# Patient Record
Sex: Male | Born: 1975 | Race: White | Hispanic: No | Marital: Married | Smoking: Current some day smoker
Health system: Southern US, Community
[De-identification: ages and names within clinical notes are randomized; demographics above are authoritative.]

## PROBLEM LIST (undated history)

## (undated) DIAGNOSIS — S069XAA Unspecified intracranial injury with loss of consciousness status unknown, initial encounter: Secondary | ICD-10-CM

## (undated) DIAGNOSIS — S3609XA Other injury of spleen, initial encounter: Secondary | ICD-10-CM

## (undated) DIAGNOSIS — A4902 Methicillin resistant Staphylococcus aureus infection, unspecified site: Secondary | ICD-10-CM

## (undated) HISTORY — DX: Unspecified intracranial injury with loss of consciousness status unknown, initial encounter: S06.9XAA

## (undated) HISTORY — PX: OSTOMY: SHX5997

## (undated) HISTORY — PX: FACIAL RECONSTRUCTION SURGERY: SHX631

## (undated) HISTORY — DX: Methicillin resistant Staphylococcus aureus infection, unspecified site: A49.02

---

## 2020-11-30 ENCOUNTER — Emergency Department: Payer: BC Managed Care – PPO

## 2020-11-30 ENCOUNTER — Inpatient Hospital Stay
Admission: EM | Admit: 2020-11-30 | Discharge: 2020-12-17 | DRG: 853 | Disposition: A | Discharge status: Home Health | Payer: BC Managed Care – PPO | Attending: Internal Medicine | Admitting: Internal Medicine

## 2020-11-30 ENCOUNTER — Other Ambulatory Visit: Payer: Self-pay

## 2020-11-30 ENCOUNTER — Inpatient Hospital Stay: Payer: BC Managed Care – PPO

## 2020-11-30 DIAGNOSIS — A419 Sepsis, unspecified organism: Secondary | ICD-10-CM

## 2020-11-30 DIAGNOSIS — R7989 Other specified abnormal findings of blood chemistry: Secondary | ICD-10-CM | POA: Diagnosis not present

## 2020-11-30 DIAGNOSIS — J9601 Acute respiratory failure with hypoxia: Secondary | ICD-10-CM | POA: Diagnosis present

## 2020-11-30 DIAGNOSIS — N179 Acute kidney failure, unspecified: Secondary | ICD-10-CM | POA: Diagnosis present

## 2020-11-30 DIAGNOSIS — I248 Other forms of acute ischemic heart disease: Secondary | ICD-10-CM | POA: Diagnosis present

## 2020-11-30 DIAGNOSIS — E872 Acidosis, unspecified: Secondary | ICD-10-CM | POA: Diagnosis present

## 2020-11-30 DIAGNOSIS — R918 Other nonspecific abnormal finding of lung field: Secondary | ICD-10-CM | POA: Diagnosis not present

## 2020-11-30 DIAGNOSIS — J9602 Acute respiratory failure with hypercapnia: Secondary | ICD-10-CM | POA: Diagnosis present

## 2020-11-30 DIAGNOSIS — K640 First degree hemorrhoids: Secondary | ICD-10-CM | POA: Diagnosis present

## 2020-11-30 DIAGNOSIS — R778 Other specified abnormalities of plasma proteins: Secondary | ICD-10-CM

## 2020-11-30 DIAGNOSIS — R338 Other retention of urine: Secondary | ICD-10-CM | POA: Diagnosis not present

## 2020-11-30 DIAGNOSIS — E876 Hypokalemia: Secondary | ICD-10-CM | POA: Diagnosis not present

## 2020-11-30 DIAGNOSIS — E860 Dehydration: Secondary | ICD-10-CM | POA: Diagnosis present

## 2020-11-30 DIAGNOSIS — K254 Chronic or unspecified gastric ulcer with hemorrhage: Secondary | ICD-10-CM | POA: Diagnosis present

## 2020-11-30 DIAGNOSIS — N1330 Unspecified hydronephrosis: Secondary | ICD-10-CM | POA: Diagnosis present

## 2020-11-30 DIAGNOSIS — R7881 Bacteremia: Secondary | ICD-10-CM

## 2020-11-30 DIAGNOSIS — G062 Extradural and subdural abscess, unspecified: Secondary | ICD-10-CM

## 2020-11-30 DIAGNOSIS — B9562 Methicillin resistant Staphylococcus aureus infection as the cause of diseases classified elsewhere: Secondary | ICD-10-CM | POA: Diagnosis not present

## 2020-11-30 DIAGNOSIS — Z9181 History of falling: Secondary | ICD-10-CM

## 2020-11-30 DIAGNOSIS — K828 Other specified diseases of gallbladder: Secondary | ICD-10-CM | POA: Diagnosis present

## 2020-11-30 DIAGNOSIS — G8929 Other chronic pain: Secondary | ICD-10-CM | POA: Diagnosis present

## 2020-11-30 DIAGNOSIS — A86 Unspecified viral encephalitis: Secondary | ICD-10-CM | POA: Diagnosis present

## 2020-11-30 DIAGNOSIS — D500 Iron deficiency anemia secondary to blood loss (chronic): Secondary | ICD-10-CM | POA: Clinically undetermined

## 2020-11-30 DIAGNOSIS — Z539 Procedure and treatment not carried out, unspecified reason: Secondary | ICD-10-CM | POA: Diagnosis not present

## 2020-11-30 DIAGNOSIS — I10 Essential (primary) hypertension: Secondary | ICD-10-CM | POA: Diagnosis present

## 2020-11-30 DIAGNOSIS — D62 Acute posthemorrhagic anemia: Secondary | ICD-10-CM | POA: Diagnosis present

## 2020-11-30 DIAGNOSIS — I160 Hypertensive urgency: Secondary | ICD-10-CM | POA: Diagnosis present

## 2020-11-30 DIAGNOSIS — N200 Calculus of kidney: Secondary | ICD-10-CM | POA: Diagnosis present

## 2020-11-30 DIAGNOSIS — A4102 Sepsis due to Methicillin resistant Staphylococcus aureus: Secondary | ICD-10-CM | POA: Diagnosis present

## 2020-11-30 DIAGNOSIS — R7401 Elevation of levels of liver transaminase levels: Secondary | ICD-10-CM | POA: Diagnosis present

## 2020-11-30 DIAGNOSIS — D65 Disseminated intravascular coagulation [defibrination syndrome]: Secondary | ICD-10-CM | POA: Diagnosis present

## 2020-11-30 DIAGNOSIS — Z4659 Encounter for fitting and adjustment of other gastrointestinal appliance and device: Secondary | ICD-10-CM

## 2020-11-30 DIAGNOSIS — M4626 Osteomyelitis of vertebra, lumbar region: Secondary | ICD-10-CM | POA: Diagnosis present

## 2020-11-30 DIAGNOSIS — Z634 Disappearance and death of family member: Secondary | ICD-10-CM

## 2020-11-30 DIAGNOSIS — R4182 Altered mental status, unspecified: Secondary | ICD-10-CM

## 2020-11-30 DIAGNOSIS — L02511 Cutaneous abscess of right hand: Secondary | ICD-10-CM | POA: Diagnosis present

## 2020-11-30 DIAGNOSIS — Z7982 Long term (current) use of aspirin: Secondary | ICD-10-CM

## 2020-11-30 DIAGNOSIS — Z419 Encounter for procedure for purposes other than remedying health state, unspecified: Secondary | ICD-10-CM

## 2020-11-30 DIAGNOSIS — M6008 Infective myositis, other site: Secondary | ICD-10-CM | POA: Diagnosis present

## 2020-11-30 DIAGNOSIS — B9689 Other specified bacterial agents as the cause of diseases classified elsewhere: Secondary | ICD-10-CM | POA: Diagnosis not present

## 2020-11-30 DIAGNOSIS — I2690 Septic pulmonary embolism without acute cor pulmonale: Secondary | ICD-10-CM | POA: Diagnosis present

## 2020-11-30 DIAGNOSIS — M48061 Spinal stenosis, lumbar region without neurogenic claudication: Secondary | ICD-10-CM | POA: Diagnosis present

## 2020-11-30 DIAGNOSIS — D696 Thrombocytopenia, unspecified: Secondary | ICD-10-CM | POA: Diagnosis not present

## 2020-11-30 DIAGNOSIS — R6520 Severe sepsis without septic shock: Secondary | ICD-10-CM | POA: Diagnosis present

## 2020-11-30 DIAGNOSIS — G061 Intraspinal abscess and granuloma: Secondary | ICD-10-CM | POA: Diagnosis present

## 2020-11-30 DIAGNOSIS — K2980 Duodenitis without bleeding: Secondary | ICD-10-CM | POA: Diagnosis present

## 2020-11-30 DIAGNOSIS — K2970 Gastritis, unspecified, without bleeding: Secondary | ICD-10-CM | POA: Diagnosis present

## 2020-11-30 DIAGNOSIS — G9340 Encephalopathy, unspecified: Secondary | ICD-10-CM | POA: Diagnosis not present

## 2020-11-30 DIAGNOSIS — D123 Benign neoplasm of transverse colon: Secondary | ICD-10-CM | POA: Diagnosis present

## 2020-11-30 DIAGNOSIS — D120 Benign neoplasm of cecum: Secondary | ICD-10-CM | POA: Diagnosis present

## 2020-11-30 DIAGNOSIS — W19XXXA Unspecified fall, initial encounter: Secondary | ICD-10-CM | POA: Diagnosis present

## 2020-11-30 DIAGNOSIS — Z20822 Contact with and (suspected) exposure to covid-19: Secondary | ICD-10-CM | POA: Diagnosis present

## 2020-11-30 DIAGNOSIS — Z9289 Personal history of other medical treatment: Secondary | ICD-10-CM

## 2020-11-30 DIAGNOSIS — M4647 Discitis, unspecified, lumbosacral region: Secondary | ICD-10-CM | POA: Diagnosis present

## 2020-11-30 DIAGNOSIS — M2578 Osteophyte, vertebrae: Secondary | ICD-10-CM | POA: Diagnosis present

## 2020-11-30 DIAGNOSIS — K5900 Constipation, unspecified: Secondary | ICD-10-CM | POA: Diagnosis present

## 2020-11-30 DIAGNOSIS — F17210 Nicotine dependence, cigarettes, uncomplicated: Secondary | ICD-10-CM | POA: Diagnosis present

## 2020-11-30 DIAGNOSIS — K7200 Acute and subacute hepatic failure without coma: Secondary | ICD-10-CM | POA: Diagnosis not present

## 2020-11-30 DIAGNOSIS — G9341 Metabolic encephalopathy: Secondary | ICD-10-CM | POA: Diagnosis present

## 2020-11-30 LAB — CBC WITH DIFFERENTIAL/PLATELET
Abs Immature Granulocytes: 0.13 10*3/uL — ABNORMAL HIGH (ref 0.00–0.07)
Basophils Absolute: 0.1 10*3/uL (ref 0.0–0.1)
Basophils Relative: 1 %
Eosinophils Absolute: 0 10*3/uL (ref 0.0–0.5)
Eosinophils Relative: 0 %
HCT: 36.6 % — ABNORMAL LOW (ref 39.0–52.0)
Hemoglobin: 12.8 g/dL — ABNORMAL LOW (ref 13.0–17.0)
Immature Granulocytes: 1 %
Lymphocytes Relative: 4 %
Lymphs Abs: 0.6 10*3/uL — ABNORMAL LOW (ref 0.7–4.0)
MCH: 29.8 pg (ref 26.0–34.0)
MCHC: 35 g/dL (ref 30.0–36.0)
MCV: 85.1 fL (ref 80.0–100.0)
Monocytes Absolute: 0.6 10*3/uL (ref 0.1–1.0)
Monocytes Relative: 4 %
Neutro Abs: 12.8 10*3/uL — ABNORMAL HIGH (ref 1.7–7.7)
Neutrophils Relative %: 90 %
Platelets: 125 10*3/uL — ABNORMAL LOW (ref 150–400)
RBC: 4.3 MIL/uL (ref 4.22–5.81)
RDW: 14.9 % (ref 11.5–15.5)
Smear Review: ADEQUATE
WBC Morphology: ABNORMAL
WBC: 14.2 10*3/uL — ABNORMAL HIGH (ref 4.0–10.5)
nRBC: 0 % (ref 0.0–0.2)

## 2020-11-30 LAB — ACETAMINOPHEN LEVEL: Acetaminophen (Tylenol), Serum: <10 ug/mL — ABNORMAL LOW (ref 10–30)

## 2020-11-30 LAB — ETHANOL: Alcohol, Ethyl (B): <10 mg/dL (ref <10)

## 2020-11-30 LAB — COMPREHENSIVE METABOLIC PANEL
ALT: 55 U/L — ABNORMAL HIGH (ref 0–44)
AST: 102 U/L — ABNORMAL HIGH (ref 15–41)
Albumin: 2.9 g/dL — ABNORMAL LOW (ref 3.5–5.0)
Alkaline Phosphatase: 166 U/L — ABNORMAL HIGH (ref 38–126)
Anion gap: 13 (ref 5–15)
BUN: 54 mg/dL — ABNORMAL HIGH (ref 6–20)
CO2: 23 mmol/L (ref 22–32)
Calcium: 8.4 mg/dL — ABNORMAL LOW (ref 8.9–10.3)
Chloride: 99 mmol/L (ref 98–111)
Creatinine, Ser: 1.58 mg/dL — ABNORMAL HIGH (ref 0.61–1.24)
GFR, Estimated: 54 mL/min — ABNORMAL LOW (ref >60)
Glucose, Bld: 117 mg/dL — ABNORMAL HIGH (ref 70–99)
Potassium: 3 mmol/L — ABNORMAL LOW (ref 3.5–5.1)
Sodium: 135 mmol/L (ref 135–145)
Total Bilirubin: 1.1 mg/dL (ref 0.3–1.2)
Total Protein: 6.5 g/dL (ref 6.5–8.1)

## 2020-11-30 LAB — URINALYSIS, COMPLETE (UACMP) WITH MICROSCOPIC
Bacteria, UA: NONE SEEN
Bilirubin Urine: NEGATIVE
Glucose, UA: NEGATIVE mg/dL
Ketones, ur: NEGATIVE mg/dL
Leukocytes,Ua: NEGATIVE
Nitrite: NEGATIVE
Protein, ur: 100 mg/dL — AB
Specific Gravity, Urine: 1.024 (ref 1.005–1.030)
pH: 5 (ref 5.0–8.0)

## 2020-11-30 LAB — LACTIC ACID, PLASMA
Lactic Acid, Venous: 2 mmol/L (ref 0.5–1.9)
Lactic Acid, Venous: 2.2 mmol/L (ref 0.5–1.9)
Lactic Acid, Venous: 1.7 mmol/L (ref 0.5–1.9)

## 2020-11-30 LAB — URINE DRUG SCREEN, QUALITATIVE (ARMC ONLY)
Amphetamines, Ur Screen: NOT DETECTED
Barbiturates, Ur Screen: NOT DETECTED
Benzodiazepine, Ur Scrn: NOT DETECTED
Cannabinoid 50 Ng, Ur ~~LOC~~: NOT DETECTED
Cocaine Metabolite,Ur ~~LOC~~: NOT DETECTED
MDMA (Ecstasy)Ur Screen: NOT DETECTED
Methadone Scn, Ur: NOT DETECTED
Opiate, Ur Screen: POSITIVE — AB
Phencyclidine (PCP) Ur S: NOT DETECTED
Tricyclic, Ur Screen: NOT DETECTED

## 2020-11-30 LAB — AMMONIA: Ammonia: 14 umol/L (ref 9–35)

## 2020-11-30 LAB — TSH: TSH: 0.267 u[IU]/mL — ABNORMAL LOW (ref 0.350–4.500)

## 2020-11-30 LAB — RESP PANEL BY RT-PCR (FLU A&B, COVID) ARPGX2
Influenza A by PCR: NEGATIVE
Influenza B by PCR: NEGATIVE
SARS Coronavirus 2 by RT PCR: NEGATIVE

## 2020-11-30 LAB — TROPONIN I (HIGH SENSITIVITY)
Troponin I (High Sensitivity): 54 ng/L — ABNORMAL HIGH (ref <18)
Troponin I (High Sensitivity): 75 ng/L — ABNORMAL HIGH (ref <18)
Troponin I (High Sensitivity): 193 ng/L (ref <18)

## 2020-11-30 LAB — APTT: aPTT: 39 seconds — ABNORMAL HIGH (ref 24–36)

## 2020-11-30 LAB — SALICYLATE LEVEL: Salicylate Lvl: 13.4 mg/dL (ref 7.0–30.0)

## 2020-11-30 LAB — PROCALCITONIN: Procalcitonin: 17.28 ng/mL

## 2020-11-30 LAB — PROTIME-INR
INR: 1.2 (ref 0.8–1.2)
Prothrombin Time: 15.6 seconds — ABNORMAL HIGH (ref 11.4–15.2)

## 2020-11-30 LAB — MAGNESIUM: Magnesium: 2.4 mg/dL (ref 1.7–2.4)

## 2020-11-30 MED ORDER — IOHEXOL 300 MG/ML  SOLN
100.0000 mL | Freq: Once | INTRAMUSCULAR | Status: AC | PRN
  Administered 2020-11-30: 100 mL via INTRAVENOUS

## 2020-11-30 MED ORDER — ACETAMINOPHEN 325 MG PO TABS: 650.0000 mg | ORAL_TABLET | Freq: Four times a day (QID) | ORAL | Status: DC | PRN

## 2020-11-30 MED ORDER — LACTATED RINGERS IV BOLUS
1000.0000 mL | Freq: Once | INTRAVENOUS | Status: AC
  Administered 2020-11-30: 1000 mL via INTRAVENOUS

## 2020-11-30 MED ORDER — LORAZEPAM 2 MG/ML IJ SOLN
1.0000 mg | Freq: Four times a day (QID) | INTRAMUSCULAR | Status: DC | PRN
  Administered 2020-11-30 – 2020-12-05 (×2): 1 mg via INTRAMUSCULAR
  Filled 2020-11-30 (×3): qty 1

## 2020-11-30 MED ORDER — METRONIDAZOLE 500 MG/100ML IV SOLN
500.0000 mg | Freq: Once | INTRAVENOUS | Status: AC
  Administered 2020-11-30: 500 mg via INTRAVENOUS
  Filled 2020-11-30: qty 100

## 2020-11-30 MED ORDER — ONDANSETRON HCL 4 MG PO TABS: 4.0000 mg | ORAL_TABLET | Freq: Four times a day (QID) | ORAL | Status: DC | PRN

## 2020-11-30 MED ORDER — VANCOMYCIN HCL IN DEXTROSE 1-5 GM/200ML-% IV SOLN: 1000.0000 mg | Freq: Once | INTRAVENOUS | Status: DC

## 2020-11-30 MED ORDER — ACETAMINOPHEN 650 MG RE SUPP
650.0000 mg | Freq: Four times a day (QID) | RECTAL | Status: DC | PRN
  Administered 2020-11-30: 650 mg via RECTAL
  Filled 2020-11-30: qty 2

## 2020-11-30 MED ORDER — VANCOMYCIN HCL 1000 MG/200ML IV SOLN
1000.0000 mg | Freq: Two times a day (BID) | INTRAVENOUS | Status: DC
  Administered 2020-12-01 – 2020-12-02 (×4): 1000 mg via INTRAVENOUS
  Filled 2020-11-30 (×5): qty 200

## 2020-11-30 MED ORDER — METOPROLOL TARTRATE 5 MG/5ML IV SOLN
5.0000 mg | Freq: Once | INTRAVENOUS | Status: AC
  Administered 2020-11-30: 5 mg via INTRAVENOUS
  Filled 2020-11-30: qty 5

## 2020-11-30 MED ORDER — ENOXAPARIN SODIUM 40 MG/0.4ML IJ SOSY
40.0000 mg | PREFILLED_SYRINGE | INTRAMUSCULAR | Status: DC
  Administered 2020-11-30: 40 mg via SUBCUTANEOUS

## 2020-11-30 MED ORDER — VANCOMYCIN HCL 1750 MG/350ML IV SOLN
1750.0000 mg | Freq: Once | INTRAVENOUS | Status: AC
  Administered 2020-11-30: 1750 mg via INTRAVENOUS
  Filled 2020-11-30: qty 350

## 2020-11-30 MED ORDER — CEFEPIME HCL 2 G IJ SOLR
2.0000 g | Freq: Three times a day (TID) | INTRAMUSCULAR | Status: DC
  Administered 2020-11-30: 2 g via INTRAVENOUS
  Filled 2020-11-30: qty 2

## 2020-11-30 MED ORDER — LACTATED RINGERS IV SOLN: INTRAVENOUS | Status: DC

## 2020-11-30 MED ORDER — SODIUM CHLORIDE 0.9 % IV SOLN
2.0000 g | Freq: Once | INTRAVENOUS | Status: AC
  Administered 2020-11-30: 2 g via INTRAVENOUS
  Filled 2020-11-30: qty 2

## 2020-11-30 MED ORDER — GADOBUTROL 1 MMOL/ML IV SOLN
7.0000 mL | Freq: Once | INTRAVENOUS | Status: AC | PRN
  Administered 2020-11-30: 7 mL via INTRAVENOUS

## 2020-11-30 MED ORDER — DEXTROSE 5 % IV SOLN
10.0000 mg/kg | Freq: Three times a day (TID) | INTRAVENOUS | Status: DC
  Administered 2020-11-30: 725 mg via INTRAVENOUS
  Filled 2020-11-30 (×3): qty 14.5

## 2020-11-30 MED ORDER — MIDAZOLAM HCL 2 MG/2ML IJ SOLN
INTRAMUSCULAR | Status: AC
  Administered 2020-11-30: 4 mg via INTRAVENOUS
  Filled 2020-11-30: qty 4

## 2020-11-30 MED ORDER — POTASSIUM CHLORIDE 10 MEQ/100ML IV SOLN
10.0000 meq | INTRAVENOUS | Status: AC
  Administered 2020-11-30 (×5): 10 meq via INTRAVENOUS
  Filled 2020-11-30 (×5): qty 100

## 2020-11-30 MED ORDER — FENTANYL CITRATE PF 50 MCG/ML IJ SOSY
50.0000 ug | PREFILLED_SYRINGE | Freq: Once | INTRAMUSCULAR | Status: AC
  Administered 2020-11-30: 50 ug via INTRAVENOUS
  Filled 2020-11-30: qty 1

## 2020-11-30 MED ORDER — LORAZEPAM 1 MG PO TABS
1.0000 mg | ORAL_TABLET | Freq: Four times a day (QID) | ORAL | Status: DC | PRN
  Administered 2020-12-03 – 2020-12-17 (×12): 1 mg via ORAL
  Filled 2020-11-30 (×12): qty 1

## 2020-11-30 MED ORDER — CHLORHEXIDINE GLUCONATE CLOTH 2 % EX PADS
6.0000 | MEDICATED_PAD | Freq: Every day | CUTANEOUS | Status: DC
  Administered 2020-12-01 – 2020-12-17 (×13): 6 via TOPICAL
  Filled 2020-11-30: qty 6

## 2020-11-30 MED ORDER — SODIUM CHLORIDE 0.9 % IV SOLN: 2.0000 g | INTRAVENOUS | Status: DC

## 2020-11-30 MED ORDER — OXYCODONE HCL 5 MG PO TABS: 5.0000 mg | ORAL_TABLET | ORAL | Status: DC | PRN

## 2020-11-30 MED ORDER — ONDANSETRON HCL 4 MG/2ML IJ SOLN: 4.0000 mg | Freq: Four times a day (QID) | INTRAMUSCULAR | Status: DC | PRN

## 2020-11-30 MED ORDER — MIDAZOLAM HCL 2 MG/2ML IJ SOLN: 4.0000 mg | Freq: Once | INTRAMUSCULAR | Status: AC

## 2020-11-30 MED ORDER — METRONIDAZOLE 500 MG/100ML IV SOLN
500.0000 mg | Freq: Two times a day (BID) | INTRAVENOUS | Status: DC
  Administered 2020-12-01: 500 mg via INTRAVENOUS
  Filled 2020-11-30: qty 100

## 2020-11-30 NOTE — Sepsis Progress Note (Signed)
Notified provider of need to order another repeat lactic acid.

## 2020-11-30 NOTE — Progress Notes (Addendum)
Pharmacy Antibiotic Note  Caleb Fisher is a 45 y.o. male admitted on 11/30/2020. Pharmacy has been consulted for cefepime dosing. 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  Plan: Cefepime 2 g IV q8h (based on calculated CrCl 59.9)  Start acyclovir 725 mg (10 mg/kg) q8h. Patient is on LR  Weight: 72.6 kg (160 lb)  Temp (24hrs), Avg:99.4 F (37.4 C), Min:99.4 F (37.4 C), Max:99.4 F (37.4 C)  Recent Labs  Lab 11/30/20 1310 11/30/20 1643  WBC 14.2*  --   CREATININE 1.58*  --   LATICACIDVEN 2.0* 2.2*    CrCl cannot be calculated (Unknown ideal weight.).    Not on File  Antimicrobials this admission: Acyclovir 11/15 >> Cefepime 11/15 >> Metronidazole 11/15 x 1 Vancomycin 11/15 x 1  Microbiology results: 11/15 BCx: pending   Thank you for allowing pharmacy to be a part of this patient's care.   Tawnya Crook, PharmD, BCPS Clinical Pharmacist 11/30/2020 6:22 PM

## 2020-11-30 NOTE — Sepsis Progress Note (Signed)
eLink is monitoring this Code Sepsis. °

## 2020-11-30 NOTE — ED Notes (Signed)
Family at bedside. 

## 2020-11-30 NOTE — Progress Notes (Signed)
PHARMACY -  BRIEF ANTIBIOTIC NOTE   Pharmacy has received consult(s) for vancomycin and cefepime from an ED provider.  The patient's profile has been reviewed for ht/wt/allergies/indication/available labs.    One time order(s) placed for: Cefepime 2 g IV Vancomycin 1750 mg IV  Further antibiotics/pharmacy consults should be ordered by admitting physician if indicated.                       Thank you, Forde Dandy Jennine Peddy 11/30/2020  2:43 PM

## 2020-11-30 NOTE — H&P (Signed)
History and Physical    [name] GGE:[id number] DOB: 01/23/1975 DOA: 11/30/2020 PCP: Merryl Hacker, No  Chief Complaint: AMS Historian: patient HPI:  Caleb Fisher is a 45 y.o. male with a PMH significant for tobacco use. They presented from home to the ED on 11/30/2020 with AMS and increased back pain for several days. Wife states that his last known normal conversations was 11/11. Since that time, he has been saying nonsensical things and falling frequently. He has been complaining of worsening of his chronic back pain and new onset neck pain. In fact, he has been rotating his whole body while keeping his neck straight rather than turn at the neck.  She denies any new activities or foods or anything out of his ordinary routine proceeding his symptoms. He "smokes like train", rarely drinks alcohol, but occasionally takes narcotics without prescription. She does not think he has done any IVD for the past 25 years while they've been married. Patient was unable to give any history. He is able to follow some simple commands and endorse that his name is [name]. In the ED, it was found that they had sever sepsis from unknown source.  No specific etiology was found in labs, UDS, head/abdominal/pelvis CT. 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 Patient vitals showed severe hypertension, tachypnea 27 and tachycardia 144. Temperature was 99.4.   They were treated with IV fluids and antibiotics.  Patient was admitted to medicine service for further workup and management of severe sepsis as outlined in detail below.  Review of Systems: unable to assess due to patient illnes  Prior to Admission medications   Not on File   I have personally, briefly reviewed patient's prior medical records in [place]  Physical Exam: Vitals:   11/30/20 1311 11/30/20 1700 11/30/20 1745  BP: (!) 161/113 (!) 198/115 (!) 166/101  Pulse: (!) 121 (!)  138 (!) 143  Resp: 18 (!) 27 (!) 22  Temp: 99.4 F (37.4 C)    TempSrc: Oral    SpO2: 96% 92% 94%  Weight: 72.6 kg      Constitutional: alert HEENT: lids and conjunctivae normal Mucous membranes are moist. Posterior pharynx clear of any exudate or lesions.Normal dentition.  Neck: normal, supple, no masses, no thyromegaly Respiratory: CTAB, no wheezing, no crackles. Normal respiratory effort. No accessory muscle use.  Cardiovascular: tachycardia, no murmurs / rubs / gallops. No extremity edema. 2+ pedal pulses. no clubbing / cyanosis.  Abdomen: soft, NT, ND, no masses or HSM palpated. Musculoskeletal: No joint deformity upper and lower extremities. Normal muscle tone.  Skin: diaphoretic, intact, normal color, normal temperature. Several scabs of various stages of healing on forearms Neurologic: Alert and oriented to self. Able to follow some simple commands. Moving all extremities constantly. Positive brudzinski. Pain with neck flexion. PERRL. EOM intact Psychiatric: agitated  Labs on Admission: I have personally reviewed following labs and imaging studies  CBC: Recent Labs  Lab 11/30/20 1310  WBC 14.2*  NEUTROABS 12.8*  HGB 12.8*  HCT 36.6*  MCV 85.1  PLT 465*   Basic Metabolic Panel: Recent Labs  Lab 11/30/20 1310  NA 135  K 3.0*  CL 99  CO2 23  GLUCOSE 117*  BUN 54*  CREATININE 1.58*  CALCIUM 8.4*  MG 2.4   GFR: CrCl cannot be calculated (Unknown ideal weight.). Liver Function Tests: Recent Labs  Lab 11/30/20 1310  AST 102*  ALT 55*  ALKPHOS 166*  BILITOT  1.1  PROT 6.5  ALBUMIN 2.9*   No results for input(s): LIPASE, AMYLASE in the last 168 hours. Recent Labs  Lab 11/30/20 1454  AMMONIA 14   Coagulation Profile: Recent Labs  Lab 11/30/20 1310  INR 1.2   Cardiac Enzymes: No results for input(s): CKTOTAL, CKMB, CKMBINDEX, TROPONINI in the last 168 hours. BNP (last 3 results) No results for input(s): PROBNP in the last 8760  hours. HbA1C: No results for input(s): HGBA1C in the last 72 hours. CBG: No results for input(s): GLUCAP in the last 168 hours. Lipid Profile: No results for input(s): CHOL, HDL, LDLCALC, TRIG, CHOLHDL, LDLDIRECT in the last 72 hours. Thyroid Function Tests: Recent Labs    11/30/20 1310  TSH 0.267*   Anemia Panel: No results for input(s): VITAMINB12, FOLATE, FERRITIN, TIBC, IRON, RETICCTPCT in the last 72 hours. Urine analysis:    Component Value Date/Time   COLORURINE AMBER (A) 11/30/2020 1454   APPEARANCEUR HAZY (A) 11/30/2020 1454   LABSPEC 1.024 11/30/2020 1454   PHURINE 5.0 11/30/2020 1454   GLUCOSEU NEGATIVE 11/30/2020 1454   HGBUR MODERATE (A) 11/30/2020 1454   BILIRUBINUR NEGATIVE 11/30/2020 1454   KETONESUR NEGATIVE 11/30/2020 1454   PROTEINUR 100 (A) 11/30/2020 1454   NITRITE NEGATIVE 11/30/2020 1454   LEUKOCYTESUR NEGATIVE 11/30/2020 1454    Radiological Exams on Admission: DG Lumbar Spine 2-3 Views  Result Date: 11/30/2020 CLINICAL DATA:  Back pain EXAM: LUMBAR SPINE - 2-3 VIEW COMPARISON:  None. FINDINGS: Normal alignment. The vertebral body heights are well preserved. Multilevel ventral endplate spurring and disc space narrowing is identified. This is most severe at the L5-S1 level. Mild facet degenerative change identified at L3-4, L4-5 and L5-S1. IMPRESSION: 1. No acute findings. 2. Degenerative disc disease and facet arthropathy. Electronically Signed   By: Kerby Moors M.D.   On: 11/30/2020 14:24   CT Head Wo Contrast  Result Date: 11/30/2020 CLINICAL DATA:  Altered mental status EXAM: CT HEAD WITHOUT CONTRAST TECHNIQUE: Contiguous axial images were obtained from the base of the skull through the vertex without intravenous contrast. COMPARISON:  None. FINDINGS: Brain: Ventricles are not dilated. There is no shift of midline structures. Cortical sulci are prominent. There is 2 x 1 cm low-density in the left parieto-occipital cortex immediately above the  tentorium, possibly suggesting encephalomalacia from previous infarction. There is no focal edema or mass effect. There are no signs of bleeding within the cranium. Vascular: There are scattered arterial calcifications. Skull: Unremarkable. Sinuses/Orbits: Unremarkable. Other: None IMPRESSION: There are no signs of bleeding within the cranium. Ventricles are not dilated. There is no focal mass effect. There is 2 x 1 cm area of low-density in the left parieto-occipital cortex, possibly suggesting encephalomalacia from previous infarction. Follow-up MRI should be considered for further characterization. Electronically Signed   By: Elmer Picker M.D.   On: 11/30/2020 13:51   CT Cervical Spine Wo Contrast  Result Date: 11/30/2020 CLINICAL DATA:  Intermittent, severe neck pain, frequent falls, fall 3 days ago EXAM: CT CERVICAL SPINE WITHOUT CONTRAST TECHNIQUE: Multidetector CT imaging of the cervical spine was performed without intravenous contrast. Multiplanar CT image reconstructions were also generated. COMPARISON:  None. FINDINGS: Alignment: Normal. Skull base and vertebrae: No acute fracture. No primary bone lesion or focal pathologic process. Soft tissues and spinal canal: No prevertebral fluid or swelling. No visible canal hematoma. Disc levels: Focally moderate disc space height loss and osteophytosis at C5-C6, with otherwise preserved disc spaces. Small, broad-based posterior disc bulge at C5-C6 (series  6, image 27, series 3, image 61). Upper chest: Paraseptal emphysema. Partially cavitary nodule of the included left pulmonary apex measuring 0.7 x 0.7 cm (series 2, image 97). Other: None. IMPRESSION: 1. No fracture or static subluxation of the cervical spine. 2. Focally moderate disc space height loss and osteophytosis at C5-C6, with otherwise preserved disc spaces. Small, broad-based posterior disc bulge at C5-C6. Cervical disc, neural foraminal, and spinal cord pathology may be further evaluated by  MRI if indicated by neurologically localizing signs and symptoms. 3. Partially cavitary nodule of the included left pulmonary apex measuring 0.7 x 0.7 cm. This is generally nonspecific and statistically likely to be infectious or inflammatory, however general differential considerations would include septic embolus and metastatic disease. Consider additional dedicated imaging of the chest at this time if warranted by acute clinical presentation. Otherwise recommend follow-up CT examination of the chest on a nonemergent basis to evaluate for this and other pulmonary nodules. Emphysema (ICD10-J43.9). Electronically Signed   By: Delanna Ahmadi M.D.   On: 11/30/2020 14:27   CT CHEST ABDOMEN PELVIS W CONTRAST  Result Date: 11/30/2020 CLINICAL DATA:  Multiple falls. EXAM: CT CHEST, ABDOMEN, AND PELVIS WITH CONTRAST TECHNIQUE: Multidetector CT imaging of the chest, abdomen and pelvis was performed following the standard protocol during bolus administration of intravenous contrast. CONTRAST:  122mL OMNIPAQUE IOHEXOL 300 MG/ML  SOLN COMPARISON:  None. FINDINGS: CT CHEST FINDINGS Cardiovascular: There is mild calcification of the aortic arch. The ascending thoracic aorta measures 4.4 cm x 4.3 cm. Normal heart size. No pericardial effusion. Mediastinum/Nodes: A 2.6 cm x 2.6 cm anterolateral right paratracheal lymph node is seen. Mild right hilar lymphadenopathy is also noted. Thyroid gland, trachea, and esophagus demonstrate no significant findings. Lungs/Pleura: Subcentimeter noncalcified lung nodules versus focal scars are seen along the posterolateral aspect of the left apex (axial CT images 7 through 11, CT series 4). A 9 mm noncalcified lung nodule is seen within the lateral aspect of the left lower lobe (axial CT image 34, CT series 4). A 1.1 cm noncalcified lung nodule is seen within the posteromedial aspect of the right lung base (axial CT image 43, CT series 4). An 8 mm noncalcified lung nodule is seen within the  lateral aspect of the right lung base (axial CT image 38, CT series 4). A 5 mm anterior left basilar noncalcified lung nodule is present (axial CT image 38, CT series 4). There is no evidence of an acute infiltrate, pleural effusion or pneumothorax. Musculoskeletal: No chest wall mass or suspicious bone lesions identified. CT ABDOMEN PELVIS FINDINGS Hepatobiliary: No focal liver abnormality is seen. No gallstones, gallbladder wall thickening, or biliary dilatation. Pancreas: Unremarkable. No pancreatic ductal dilatation or surrounding inflammatory changes. Spleen: Normal in size without focal abnormality. Adrenals/Urinary Tract: Adrenal glands are unremarkable. Kidneys are normal in size, without obstructing renal calculi or focal lesion. A 1.2 cm x 1.1 cm nonobstructing renal calculus is seen within the mid right kidney. Mild right-sided hydronephrosis is seen. No ureteral calculi are identified. Mild Peri ureteral inflammatory fat stranding is seen. This extends along the anterior aspect of the right psoas muscle. Bladder is unremarkable. Stomach/Bowel: Stomach is within normal limits. Appendix appears normal. No evidence of bowel wall thickening, distention, or inflammatory changes. Vascular/Lymphatic: Aortic atherosclerosis. No enlarged abdominal or pelvic lymph nodes. Reproductive: Prostate is unremarkable. Other: No abdominal wall hernia or abnormality. No abdominopelvic ascites. Musculoskeletal: No acute osseous abnormalities are identified. Marked severity degenerative changes are seen at the level of L5-S1. IMPRESSION:  1. Mild right-sided periureteral inflammatory fat stranding which may represent sequelae associated with acute pyelonephritis versus recently passed renal calculus. Correlation with urinalysis is recommended. 2. Multiple noncalcified lung nodules within both lungs, as described above. Non-contrast chest CT at 3-6 months is recommended. If the nodules are stable at time of repeat CT, then  future CT at 18-24 months (from today's scan) is considered optional for low-risk patients, but is recommended for high-risk patients. This recommendation follows the consensus statement: Guidelines for Management of Incidental Pulmonary Nodules Detected on CT Images: From the Fleischner Society 2017; Radiology 2017; 284:228-243. 3. Ascending thoracic aortic aneurysm, measuring 4.4 cm x 4.3 cm. 4. 1.2 cm x 1.1 cm nonobstructing renal calculus within the mid right kidney. 5. Marked severity degenerative changes at the level of L5-S1. 6. Aortic atherosclerosis. Aortic Atherosclerosis (ICD10-I70.0). Electronically Signed   By: Virgina Norfolk M.D.   On: 11/30/2020 16:25   CT T-SPINE NO CHARGE  Result Date: 11/30/2020 CLINICAL DATA:  Altered mental status.  Back pain. EXAM: CT THORACIC SPINE WITHOUT CONTRAST TECHNIQUE: Multidetector CT images of the thoracic were obtained using the standard protocol without intravenous contrast. COMPARISON:  CT chest 11/30/2020 FINDINGS: Alignment: Normal Vertebrae: No thoracic region fracture or focal bone lesion. Paraspinal and other soft tissues: Negative Disc levels: No significant thoracic region degenerative change. No apparent stenosis of the canal or foramina. No significant facet arthritis. IMPRESSION: No acute or traumatic finding. No significant abnormality in the thoracic region. Electronically Signed   By: Nelson Chimes M.D.   On: 11/30/2020 16:14   CT L-SPINE NO CHARGE  Result Date: 11/30/2020 CLINICAL DATA:  Neck and back pain.  Lethargic.  Multiple falls. EXAM: CT LUMBAR SPINE WITHOUT CONTRAST TECHNIQUE: Multidetector CT imaging of the lumbar spine was performed without intravenous contrast administration. Multiplanar CT image reconstructions were also generated. COMPARISON:  Radiography 11/30/2020 FINDINGS: Segmentation: 5 lumbar type vertebral bodies. Alignment: No malalignment. Vertebrae: No evidence of fracture. Chronic discogenic sclerotic change of the  endplates at Z0-Y1. Paraspinal and other soft tissues: Negative Disc levels: No significant disc level finding at L3-4 or above. L4-5: Circumferential protrusion of the disc. Facet and ligamentous hypertrophy. Multifactorial stenosis at this level that could cause neural compression. L5-S1: Chronic disc degeneration with sclerotic bone changes as noted above. Shallow protrusion of the disc. Stenosis of the subarticular lateral recesses that could cause neural compression. IMPRESSION: No acute lumbar region finding. 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 Electronically Signed   By: Nelson Chimes M.D.   On: 11/30/2020 16:13   DG Chest Port 1 View  Result Date: 11/30/2020 CLINICAL DATA:  Questionable sepsis.  Evaluate for abnormality. EXAM: PORTABLE CHEST 1 VIEW COMPARISON:  None. FINDINGS: Lungs are clear. Heart and mediastinum are within normal limits. Trachea is midline. No acute bone abnormality. Negative for a pneumothorax. IMPRESSION: No active disease. Electronically Signed   By: Markus Daft M.D.   On: 11/30/2020 14:19   DG Lumbar Puncture Fluoro Guide  Result Date: 11/30/2020 CLINICAL DATA:  Altered mental status with neck pain and back pain concern for meningitis request received for lumbar puncture. EXAM: DIAGNOSTIC LUMBAR PUNCTURE UNDER FLUOROSCOPIC GUIDANCE COMPARISON:  CT head imaging report reviewed prior to the procedure performed same day. FLUOROSCOPY TIME:  Fluoroscopy Time:  0.2 minute Radiation Exposure Index (if provided by the fluoroscopic device): 1.10 mGy Number of Acquired Spot Images: 1 PROCEDURE: Informed consent was obtained from the patient's  wife prior to the procedure, including potential complications of bleeding, infection, CSF leak and need for additional procedures, inability to perform the procedure if patient is unable to cooperate, nerve damage, headache, allergy, and pain. With the patient prone, the  lower back was prepped with Betadine. 1% Lidocaine was used for local anesthesia. Lumbar puncture was attempted to be performed at the L4-L5 level using a 22 gauge needle, however the patient was unable to tolerate the procedure or lie flat, the needle was removed in its entirety and the procedure was aborted. No immediate complications, sterile dressing applied. IMPRESSION: Unsuccessful attempt at fluoroscopic guided lumbar puncture secondary to patient's inability to tolerate the procedure, procedure was aborted for safety reasons. The ordering provider was contacted regarding the inability to perform the procedure. Read By: Tsosie Billing PA-C Electronically Signed   By: Maurine Simmering M.D.   On: 11/30/2020 17:01    EKG: Independently reviewed. Sinus tachycardia with prolonged Qtc  Assessment/Plan Active Problems:   Encephalopathy acute   Severe sepsis- with acute encephalitis. with unknown source- meets criteria with tachycardia, tachypnea, lactic acidosis, leukocytosis, encephalopathy. UDS negative except for opioids. Positive brudzinski sign and complains of pain with flexion of neck. Concern for meningitis. Consider endocarditis. - continue vanc, cefepime, flagyl (11/15- - continue maintenance IV fluids - echo - neurology consult to assess  - LP  - acyclovir IV - brain MRI - repeat LA - sitter - ativan PRN for agitation - frequent neuro check  Elevated troponin  elevated BP  tachycardia- appears to be sinus tachy on ECG. Trops 54>75, BP as high as 198/115, HR up to 144. No hemorrhage on head CT. NSTEMI vs demand ischemia.  - repeat troponin since trending up - cardiac consult am - lopressor  Prolonged Qtc- 583 - avoid prolonging agents - repeat ECG after HR has reduced  TSH low - T4 am  Hematuria- RBCs in urine microscopy correlate with kidney stone and CT finding of periurethral changes.  - repeat urinalysis once improved to verify resolution.   Tobacco use-  - nicotine  replacement PRN  DVT prophylaxis: enoxaparin (LOVENOX) injection 40 mg Start: 11/30/20 1800   Code Status: full  Family Communication: wife at bedside  Disposition Plan: TBD  Consults called: neuro   Richarda Osmond, DO [facility]  11/30/2020, 7:38 PM    To contact the appropriate [facility] Attending or Consulting provider: Check the care team in [facility] for a) attending/consulting [facility] provider name and b) the [facility] team listed Log into [email or web address] and use [facility]'s universal password to access. If you do not have the password, please contact the hospital operator. Locate the [facility] provider you are looking for under [facility] and page to a number that you can be directly reached. (Text pages appreciated) If you still have difficulty reaching the provider, please page the [facility] (Director on Call) for the Hospitalists listed on amion for assistance.

## 2020-11-30 NOTE — ED Provider Notes (Signed)
Emergency Medicine Provider Triage Evaluation Note  Caleb Fisher , a 45 y.o. male  was evaluated in triage.  Pt complains of pain from neck all the way down his back. For the past 2 days, he has not been able to focus. He has been disoriented and when he does speak, he doesn't make sense. This has been intermittent for a long time. He has never been evaluated for these symptoms. Wife reports frequent falls over the years while at work, but has refused to be evaluated. Patient reports fall 3 days ago, but unable to verify.  Review of Systems  Positive: Pain, altered mental status Negative: Fever  Physical Exam  There were no vitals taken for this visit. Gen:   Awake, no distress   Resp:  Normal effort  MSK:   Moves extremities without difficulty  Other:              No pronator drift, follows commands   Medical Decision Making  Medically screening exam initiated at 1:07 PM.  Appropriate orders placed.  Kierre Hintz was informed that the remainder of the evaluation will be completed by another provider, this initial triage assessment does not replace that evaluation, and the importance of remaining in the ED until their evaluation is complete.     Victorino Dike, FNP 11/30/20 1320    Lucrezia Starch, MD 11/30/20 1538

## 2020-11-30 NOTE — Procedures (Signed)
PROCEDURE SUMMARY:  Unsuccessful attempt at fluoroscopic guided lumbar puncture secondary to patient's inability to tolerate the procedure and lie flat.  The ordering provider was contacted regarding the inability to perform the procedure. No immediate complications.    EBL < 20mL  Rockney Ghee 11/30/2020 4:31 PM

## 2020-11-30 NOTE — ED Notes (Signed)
Updated OR on pt's status

## 2020-11-30 NOTE — ED Notes (Signed)
Phone report given to [employer]

## 2020-11-30 NOTE — ED Notes (Signed)
Pt returned from MRI. Pt O2 sat @ 88%. Pt placed on 2L [place] per RN.

## 2020-11-30 NOTE — ED Notes (Signed)
Verbal critical given to [facility]

## 2020-11-30 NOTE — ED Triage Notes (Signed)
Pt to ED with wife from [facility] for AMS, neck pain and back pain. AMS over the past 2 days Pt very lethargic in triage, difficult to understand, disoriented. Wife reports multiple falls at work over the past year and has not been evaluated, pt states recent fall this week, unsure if accurate. No blood thinner use  Wife reports pt trying to drink out of toilet paper roll this am and trying to catch a pig (does not have a pig)

## 2020-11-30 NOTE — ED Notes (Signed)
Anderson, MD at bedside.

## 2020-11-30 NOTE — ED Provider Notes (Signed)
  Patient received in signout from Dr. Hulan Saas pending CT imaging, remainder of labs and IR lumbar puncture for evaluation of sepsis, confusion and head/neck pain.  Patient went over to radiology suite to get LP performed by IR PA, but this was unsuccessful due to patient inability to sit still.  I had gone over to the radiology area and provided 4 mg of IV Versed to help facilitate this, but despite this, was unsuccessful.  Subsequent CT imaging of chest, abdomen and pelvis with spinal reformats largely unremarkable.  Some mild periureteral stranding noted on one side, but UA is not infectious and less likely to represent pyelonephritis. Ultimately, meningitis/encephalitis is certainly still a concern.  I discussed the case with hospitalist, who requests I speak with neurology due to this possibility, so I did briefly discuss the case with Dr. Rory Percy recommends the addition of acyclovir to antibiotic regimen as well as consider an MRI with and without contrast to further assess for meningitis or other CNS pathology contributing to his mental status changes.  He agrees to consult on the patient and see him tomorrow morning.  .Critical Care Performed by: Vladimir Crofts, MD Authorized by: Vladimir Crofts, MD   Critical care provider statement:    Critical care time (minutes):  30   Critical care time was exclusive of:  Separately billable procedures and treating other patients   Critical care was necessary to treat or prevent imminent or life-threatening deterioration of the following conditions:  Sepsis   Critical care was time spent personally by me on the following activities:  Development of treatment plan with patient or surrogate, discussions with consultants, evaluation of patient's response to treatment, examination of patient, ordering and review of laboratory studies, ordering and review of radiographic studies, ordering and performing treatments and interventions, pulse oximetry, re-evaluation  of patient's condition and review of old charts     Vladimir Crofts, MD 11/30/20 1916

## 2020-11-30 NOTE — Progress Notes (Signed)
CODE SEPSIS - PHARMACY COMMUNICATION  **Broad Spectrum Antibiotics should be administered within 1 hour of Sepsis diagnosis**  Time Code Sepsis Called/Page Received: 1436  Antibiotics Ordered: vancomycin, cefepime, metronidazole  Time of 1st antibiotic administration: 1646  Additional action taken by pharmacy: Pt was in CT and was continuously pulling on IV lines    Sherilyn Banker ,PharmD Clinical Pharmacist  11/30/2020  2:44 PM

## 2020-11-30 NOTE — ED Provider Notes (Addendum)
[facility] Emergency Department Provider Note  ____________________________________________   Event Date/Time   First MD Initiated Contact with Patient 11/30/20 1321     (approximate)  I have reviewed the triage vital signs and the nursing notes.   HISTORY  Chief Complaint Altered Mental Status   HPI Caleb Fisher is a 45 y.o. male without significant past medical history presents accompanied by his wife for assessment of some confusion headache and neck pain after recent fall.  He was referred to the ED from urgent care.  It seems patient had a fall about 2 days ago since then has been very confused and nonsensical.  His wife reports that he has fallen several times over the past year but has not been evaluated.  He does not have any known medical history.         History reviewed. No pertinent past medical history.  There are no problems to display for this patient.   History reviewed. No pertinent surgical history.  Prior to Admission medications   Not on File    Allergies Patient has no allergy information on record.  No family history on file.  Social History Social History   Substance Use Topics   Alcohol use: Yes    Review of Systems  Review of Systems  Unable to perform ROS: Mental status change     ____________________________________________   PHYSICAL EXAM:  VITAL SIGNS: ED Triage Vitals [11/30/20 1311]  Enc Vitals Group     BP (!) 161/113     Pulse Rate (!) 121     Resp 18     Temp 99.4 F (37.4 C)     Temp Source Oral     SpO2 96 %     Weight 160 lb (72.6 kg)     Height      Head Circumference      Peak Flow      Pain Score      Pain Loc      Pain Edu?      Excl. in [place]?    Vitals:   11/30/20 1311  BP: (!) 161/113  Pulse: (!) 121  Resp: 18  Temp: 99.4 F (37.4 C)  SpO2: 96%   Physical Exam Vitals and nursing note reviewed.  Constitutional:      General: He is in acute distress.      Appearance: He is well-developed. He is ill-appearing.  HENT:     Head: Normocephalic and atraumatic.     Right Ear: External ear normal.     Left Ear: External ear normal.  Eyes:     Conjunctiva/sclera: Conjunctivae normal.  Cardiovascular:     Rate and Rhythm: Regular rhythm. Tachycardia present.     Heart sounds: No murmur heard. Pulmonary:     Effort: Pulmonary effort is normal. No respiratory distress.     Breath sounds: Normal breath sounds.  Abdominal:     Palpations: Abdomen is soft.     Tenderness: There is no abdominal tenderness.  Musculoskeletal:     Cervical back: Neck supple.  Skin:    General: Skin is warm and dry.     Capillary Refill: Capillary refill takes more than 3 seconds.  Neurological:     Mental Status: He is alert. He is disoriented and confused.     ____________________________________________   LABS (all labs ordered are listed, but only abnormal results are displayed)  Labs Reviewed  COMPREHENSIVE METABOLIC PANEL - Abnormal; Notable for the following  components:      Result Value   Potassium 3.0 (*)    Glucose, Bld 117 (*)    BUN 54 (*)    Creatinine, Ser 1.58 (*)    Calcium 8.4 (*)    Albumin 2.9 (*)    AST 102 (*)    ALT 55 (*)    Alkaline Phosphatase 166 (*)    GFR, Estimated 54 (*)    All other components within normal limits  CBC WITH DIFFERENTIAL/PLATELET - Abnormal; Notable for the following components:   WBC 14.2 (*)    Hemoglobin 12.8 (*)    HCT 36.6 (*)    Platelets 125 (*)    Neutro Abs 12.8 (*)    Lymphs Abs 0.6 (*)    Abs Immature Granulocytes 0.13 (*)    All other components within normal limits  LACTIC ACID, PLASMA - Abnormal; Notable for the following components:   Lactic Acid, Venous 2.0 (*)    All other components within normal limits  TSH - Abnormal; Notable for the following components:   TSH 0.267 (*)    All other components within normal limits  PROTIME-INR - Abnormal; Notable for the following components:    Prothrombin Time 15.6 (*)    All other components within normal limits  APTT - Abnormal; Notable for the following components:   aPTT 39 (*)    All other components within normal limits  BLOOD GAS, VENOUS - Abnormal; Notable for the following components:   pCO2, Ven 40 (*)    All other components within normal limits  ACETAMINOPHEN LEVEL - Abnormal; Notable for the following components:   Acetaminophen (Tylenol), Serum <10 (*)    All other components within normal limits  TROPONIN I (HIGH SENSITIVITY) - Abnormal; Notable for the following components:   Troponin I (High Sensitivity) 54 (*)    All other components within normal limits  CULTURE, BLOOD (ROUTINE X 2)  CULTURE, BLOOD (ROUTINE X 2)  RESP PANEL BY RT-PCR (FLU A&B, COVID) ARPGX2  CSF CULTURE W GRAM STAIN  ETHANOL  SALICYLATE LEVEL  MAGNESIUM  PROCALCITONIN  URINALYSIS, ROUTINE W REFLEX MICROSCOPIC  URINE DRUG SCREEN, QUALITATIVE ([facility] ONLY)  LACTIC ACID, PLASMA  URINALYSIS, COMPLETE (UACMP) WITH MICROSCOPIC  HIV ANTIBODY (ROUTINE TESTING W REFLEX)  AMMONIA  CSF CELL COUNT WITH DIFFERENTIAL  PROTEIN AND GLUCOSE, CSF  URINE PH  TROPONIN I (HIGH SENSITIVITY)   ____________________________________________  EKG  ECG remarkable for sinus tachycardia with some nonspecific changes in anterior and lateral leads and artifact without other clear evidence of ischemia.  QTc interval is 583. ____________________________________________  RADIOLOGY  ED MD interpretation:    CT head shows no evidence of hemorrhage, skull fracture, ventriculomegaly or focal mass.  There is a 2 x 1 cm area of low density in the left parietal occipital cortex of unclear etiology possibly representing encephalomalacia from previous CVA.  CT C-spine shows no acute fracture or subluxation.  There is some disc loss and osteophytes at C5-C6 and bulging at C5 on C6 as well.  There is also some foraminal stenosis and partially imaged cavitary nodule in  the left lung apex nonspecific.  Otherwise there is some evidence of emphysema.  Chest x-ray shows no rib fracture, focal consolidation, effusion, edema, pneumothorax or other clear acute thoracic process.  Plain film of the L-spine shows no acute fracture or dislocation.  Official radiology report(s): DG Lumbar Spine 2-3 Views  Result Date: 11/30/2020 CLINICAL DATA:  Back pain EXAM: LUMBAR SPINE -  2-3 VIEW COMPARISON:  None. FINDINGS: Normal alignment. The vertebral body heights are well preserved. Multilevel ventral endplate spurring and disc space narrowing is identified. This is most severe at the L5-S1 level. Mild facet degenerative change identified at L3-4, L4-5 and L5-S1. IMPRESSION: 1. No acute findings. 2. Degenerative disc disease and facet arthropathy. Electronically Signed   By: Kerby Moors M.D.   On: 11/30/2020 14:24   CT Head Wo Contrast  Result Date: 11/30/2020 CLINICAL DATA:  Altered mental status EXAM: CT HEAD WITHOUT CONTRAST TECHNIQUE: Contiguous axial images were obtained from the base of the skull through the vertex without intravenous contrast. COMPARISON:  None. FINDINGS: Brain: Ventricles are not dilated. There is no shift of midline structures. Cortical sulci are prominent. There is 2 x 1 cm low-density in the left parieto-occipital cortex immediately above the tentorium, possibly suggesting encephalomalacia from previous infarction. There is no focal edema or mass effect. There are no signs of bleeding within the cranium. Vascular: There are scattered arterial calcifications. Skull: Unremarkable. Sinuses/Orbits: Unremarkable. Other: None IMPRESSION: There are no signs of bleeding within the cranium. Ventricles are not dilated. There is no focal mass effect. There is 2 x 1 cm area of low-density in the left parieto-occipital cortex, possibly suggesting encephalomalacia from previous infarction. Follow-up MRI should be considered for further characterization. Electronically  Signed   By: Elmer Picker M.D.   On: 11/30/2020 13:51   CT Cervical Spine Wo Contrast  Result Date: 11/30/2020 CLINICAL DATA:  Intermittent, severe neck pain, frequent falls, fall 3 days ago EXAM: CT CERVICAL SPINE WITHOUT CONTRAST TECHNIQUE: Multidetector CT imaging of the cervical spine was performed without intravenous contrast. Multiplanar CT image reconstructions were also generated. COMPARISON:  None. FINDINGS: Alignment: Normal. Skull base and vertebrae: No acute fracture. No primary bone lesion or focal pathologic process. Soft tissues and spinal canal: No prevertebral fluid or swelling. No visible canal hematoma. Disc levels: Focally moderate disc space height loss and osteophytosis at C5-C6, with otherwise preserved disc spaces. Small, broad-based posterior disc bulge at C5-C6 (series 6, image 27, series 3, image 61). Upper chest: Paraseptal emphysema. Partially cavitary nodule of the included left pulmonary apex measuring 0.7 x 0.7 cm (series 2, image 97). Other: None. IMPRESSION: 1. No fracture or static subluxation of the cervical spine. 2. Focally moderate disc space height loss and osteophytosis at C5-C6, with otherwise preserved disc spaces. Small, broad-based posterior disc bulge at C5-C6. Cervical disc, neural foraminal, and spinal cord pathology may be further evaluated by MRI if indicated by neurologically localizing signs and symptoms. 3. Partially cavitary nodule of the included left pulmonary apex measuring 0.7 x 0.7 cm. This is generally nonspecific and statistically likely to be infectious or inflammatory, however general differential considerations would include septic embolus and metastatic disease. Consider additional dedicated imaging of the chest at this time if warranted by acute clinical presentation. Otherwise recommend follow-up CT examination of the chest on a nonemergent basis to evaluate for this and other pulmonary nodules. Emphysema (ICD10-J43.9). Electronically  Signed   By: Delanna Ahmadi M.D.   On: 11/30/2020 14:27   DG Chest Port 1 View  Result Date: 11/30/2020 CLINICAL DATA:  Questionable sepsis.  Evaluate for abnormality. EXAM: PORTABLE CHEST 1 VIEW COMPARISON:  None. FINDINGS: Lungs are clear. Heart and mediastinum are within normal limits. Trachea is midline. No acute bone abnormality. Negative for a pneumothorax. IMPRESSION: No active disease. Electronically Signed   By: Markus Daft M.D.   On: 11/30/2020 14:19  ____________________________________________   PROCEDURES  Procedure(s) performed (including Critical Care):  .Lumbar Puncture  Date/Time: 11/30/2020 3:32 PM Performed by: Lucrezia Starch, MD Authorized by: Lucrezia Starch, MD   Consent:    Consent obtained:  Verbal   Consent given by:  Patient and healthcare agent (verbal consent also obtained from wife)   Risks discussed:  Bleeding, infection, pain, headache, nerve damage and repeat procedure   Alternatives discussed:  Delayed treatment Universal protocol:    Procedure explained and questions answered to patient or proxy's satisfaction: yes     Patient identity confirmed:  Verbally with patient Pre-procedure details:    Procedure purpose:  Diagnostic   Preparation: Patient was prepped and draped in usual sterile fashion   Anesthesia:    Anesthesia method:  Local infiltration   Local anesthetic:  Lidocaine 1% w/o epi Procedure details:    Lumbar space:  L3-L4 interspace   Patient position:  Sitting   Ultrasound guidance: no     Number of attempts:  2   Total volume (ml):  0 Post-procedure details:    Puncture site:  Adhesive bandage applied   Procedure completion:  Tolerated well, no immediate complications .Critical Care Performed by: Lucrezia Starch, MD Authorized by: Lucrezia Starch, MD   Critical care provider statement:    Critical care time (minutes):  30   Critical care time was exclusive of:  Separately billable procedures and treating other  patients   Critical care was necessary to treat or prevent imminent or life-threatening deterioration of the following conditions:  Sepsis   Critical care was time spent personally by me on the following activities:  Development of treatment plan with patient or surrogate, discussions with consultants, evaluation of patient's response to treatment, examination of patient, ordering and review of laboratory studies, ordering and review of radiographic studies, ordering and performing treatments and interventions, pulse oximetry, re-evaluation of patient's condition and review of old charts   ____________________________________________   INITIAL IMPRESSION / [place] / ED COURSE      Patient presents with above-stated history exam for assessment of headache confusion neck pain after recent fall.  On arrival patient is hypertensive with BP of 161/113 and tachycardic at 121 with otherwise stable vital signs on room air.    Initial differential is quite broad and includes CVA, SAH, TBI, metabolic derangements, sepsis including possible meningitis or encephalitis, arrhythmia, ACS, toxic ingestion and endocrine derangements.  CT head shows no evidence of hemorrhage, skull fracture, ventriculomegaly or focal mass.  There is a 2 x 1 cm area of low density in the left parietal occipital cortex of unclear etiology possibly representing encephalomalacia from previous CVA.  CT C-spine shows no acute fracture or subluxation.  There is some disc loss and osteophytes at C5-C6 and bulging at C5 on C6 as well.  There is also some foraminal stenosis and partially imaged cavitary nodule in the left lung apex nonspecific.  Otherwise there is some evidence of emphysema.  Chest x-ray shows no rib fracture, focal consolidation, effusion, edema, pneumothorax or other clear acute thoracic process.  Plain film of the L-spine shows no acute fracture or dislocation.  CBC shows WBC count of 14.2 and  hemoglobin of 12.8.  Platelets are 125.  Lactic acid at the upper limit of normal at 2.  Salicylate level therapeutic range at 13.4.  Serum ethanol undetectable.  Serum acetaminophen level undetectable.  Salicylate level 15.4.  Magnesium is WNL.  Coags are unremarkable.  VBG with  a pH of 7.4 with a PCO2 of 40 and bicarb 24.8.  Procalcitonin elevated 17.28.  TSH 0.267.  I did attempt an LP per procedure note above but was unable to obtain CSF after 2 attempts and given some difficulty with patient cooperation further attempts deferred.  In the meantime code sepsis initiated patient given broad-spectrum antibiotics as well as 2 L of IV fluids.  Care patient signed over to assuming father approximately 1500.  Plan is to follow-up CT imaging remainder of labs and admit.     ____________________________________________   FINAL CLINICAL IMPRESSION(S) / ED DIAGNOSES  Final diagnoses:  Hypokalemia  Altered mental status, unspecified altered mental status type  Troponin I above reference range  Sepsis (HCC)    Medications  potassium chloride 10 mEq in 100 mL IVPB (10 mEq Intravenous New Bag/Given 11/30/20 1433)  lactated ringers bolus 1,000 mL (has no administration in time range)  lactated ringers infusion (has no administration in time range)  ceFEPIme (MAXIPIME) 2 g in sodium chloride 0.9 % 100 mL IVPB (has no administration in time range)  metroNIDAZOLE (FLAGYL) IVPB 500 mg (has no administration in time range)  vancomycin (VANCOREADY) IVPB 1750 mg/350 mL (has no administration in time range)  lactated ringers bolus 1,000 mL (1,000 mLs Intravenous New Bag/Given 11/30/20 1431)  iohexol (OMNIPAQUE) 300 MG/ML solution 100 mL (100 mLs Intravenous Contrast Given 11/30/20 1524)  fentaNYL (SUBLIMAZE) injection 50 mcg (50 mcg Intravenous Given 11/30/20 1503)     ED Discharge Orders     None        Note:  This document was prepared using Dragon voice recognition software and may include  unintentional dictation errors.    Lucrezia Starch, MD 11/30/20 1537    Lucrezia Starch, MD 11/30/20 [phone]

## 2020-11-30 NOTE — Progress Notes (Signed)
Pharmacy Antibiotic Note  Caleb Fisher is a 45 y.o. male admitted on 11/30/2020 with sepsis w/ epidural abscess.  Pharmacy has been consulted for Vancomycin dosing.  Plan: Vancomycin Pt given initial dose of Vancomycin 1750 mg. Vancomycin 1000 mg IV Q 12 hrs.  Goal AUC 400-550. Expected AUC: 511.1 SCr used: 1.1  Pharmacy will continue to follow and will adjust abx dosing when warranted.  Height: 5\' 9"  (175.3 cm) Weight: 73.4 kg (161 lb 13.1 oz) IBW/kg (Calculated) : 70.7  Temp (24hrs), Avg:103 F (39.4 C), Min:99.4 F (37.4 C), Max:104.4 F (40.2 C)  Recent Labs  Lab 11/30/20 1310 11/30/20 1643 11/30/20 2237  WBC 14.2*  --   --   CREATININE 1.58*  --   --   LATICACIDVEN 2.0* 2.2* 1.7    Estimated Creatinine Clearance: 58.4 mL/min (A) (by C-G formula based on SCr of 1.58 mg/dL (H)).    Not on File  Antimicrobials this admission: 11/15 Cefepime >> x 2 doses 11/15 Flagyl >>  11/15 Vancomycin >>  Microbiology results: 11/15 BCx: 4 of 4 bottle w/ MRSA 11/15 CSFCx: Pending   Thank you for allowing pharmacy to be a part of this patient's care.  Renda Rolls, PharmD, [facility] 11/30/2020 11:18 PM

## 2020-11-30 NOTE — ED Notes (Signed)
Pt in CT and would not stay still for exams, pt is confused and attempting to get out of the bed. Administered Versed in CT at this time.

## 2020-11-30 NOTE — ED Notes (Signed)
Pt is continuously attempting to pull at IV and monitor cords. Pt is not redirectable and will not follow directions. Sitter at bedside at this time.

## 2020-12-01 ENCOUNTER — Inpatient Hospital Stay: Payer: BC Managed Care – PPO | Admitting: Anesthesiology

## 2020-12-01 ENCOUNTER — Inpatient Hospital Stay (HOSPITAL_COMMUNITY)
Admit: 2020-12-01 | Discharge: 2020-12-01 | Disposition: A | Discharge status: Home / Self Care | Payer: BC Managed Care – PPO | Attending: Infectious Diseases | Admitting: Infectious Diseases

## 2020-12-01 ENCOUNTER — Inpatient Hospital Stay: Payer: BC Managed Care – PPO

## 2020-12-01 ENCOUNTER — Encounter
Admission: EM | Disposition: A | Discharge status: Home Health | Payer: Self-pay | Source: Home / Self Care | Attending: Student in an Organized Health Care Education/Training Program

## 2020-12-01 DIAGNOSIS — I160 Hypertensive urgency: Secondary | ICD-10-CM

## 2020-12-01 DIAGNOSIS — B9562 Methicillin resistant Staphylococcus aureus infection as the cause of diseases classified elsewhere: Secondary | ICD-10-CM

## 2020-12-01 DIAGNOSIS — R7881 Bacteremia: Secondary | ICD-10-CM

## 2020-12-01 DIAGNOSIS — A419 Sepsis, unspecified organism: Secondary | ICD-10-CM | POA: Diagnosis not present

## 2020-12-01 DIAGNOSIS — R6520 Severe sepsis without septic shock: Secondary | ICD-10-CM | POA: Diagnosis not present

## 2020-12-01 DIAGNOSIS — G9341 Metabolic encephalopathy: Secondary | ICD-10-CM | POA: Diagnosis not present

## 2020-12-01 DIAGNOSIS — G9340 Encephalopathy, unspecified: Secondary | ICD-10-CM | POA: Diagnosis not present

## 2020-12-01 DIAGNOSIS — A4102 Sepsis due to Methicillin resistant Staphylococcus aureus: Principal | ICD-10-CM

## 2020-12-01 DIAGNOSIS — G062 Extradural and subdural abscess, unspecified: Secondary | ICD-10-CM

## 2020-12-01 DIAGNOSIS — R4182 Altered mental status, unspecified: Secondary | ICD-10-CM | POA: Diagnosis not present

## 2020-12-01 DIAGNOSIS — D696 Thrombocytopenia, unspecified: Secondary | ICD-10-CM | POA: Diagnosis present

## 2020-12-01 DIAGNOSIS — G061 Intraspinal abscess and granuloma: Secondary | ICD-10-CM

## 2020-12-01 DIAGNOSIS — K7200 Acute and subacute hepatic failure without coma: Secondary | ICD-10-CM

## 2020-12-01 DIAGNOSIS — E876 Hypokalemia: Secondary | ICD-10-CM | POA: Diagnosis present

## 2020-12-01 DIAGNOSIS — I10 Essential (primary) hypertension: Secondary | ICD-10-CM | POA: Diagnosis present

## 2020-12-01 HISTORY — PX: POSTERIOR CERVICAL LAMINECTOMY FOR EPIDURAL ABSCESS: SHX6034

## 2020-12-01 LAB — BASIC METABOLIC PANEL
Anion gap: 11 (ref 5–15)
BUN: 34 mg/dL — ABNORMAL HIGH (ref 6–20)
CO2: 23 mmol/L (ref 22–32)
Calcium: 8.1 mg/dL — ABNORMAL LOW (ref 8.9–10.3)
Chloride: 102 mmol/L (ref 98–111)
Creatinine, Ser: 1.01 mg/dL (ref 0.61–1.24)
GFR, Estimated: >60 mL/min (ref >60)
Glucose, Bld: 83 mg/dL (ref 70–99)
Potassium: 2.3 mmol/L — CL (ref 3.5–5.1)
Sodium: 136 mmol/L (ref 135–145)

## 2020-12-01 LAB — BLOOD CULTURE ID PANEL (REFLEXED) - BCID2

## 2020-12-01 LAB — BLOOD GAS, ARTERIAL
Acid-Base Excess: 1.3 mmol/L (ref 0.0–2.0)
Bicarbonate: 26.6 mmol/L (ref 20.0–28.0)
FIO2: 50
MECHVT: 450 mL
O2 Saturation: 98.8 %
PEEP: 5 cmH2O
Patient temperature: 37
RATE: 16 resp/min
pCO2 arterial: 44 mmHg (ref 32.0–48.0)
pH, Arterial: 7.39 (ref 7.350–7.450)
pO2, Arterial: 124 mmHg — ABNORMAL HIGH (ref 83.0–108.0)

## 2020-12-01 LAB — COMPREHENSIVE METABOLIC PANEL
ALT: 49 U/L — ABNORMAL HIGH (ref 0–44)
AST: 97 U/L — ABNORMAL HIGH (ref 15–41)
Albumin: 2.2 g/dL — ABNORMAL LOW (ref 3.5–5.0)
Alkaline Phosphatase: 129 U/L — ABNORMAL HIGH (ref 38–126)
Anion gap: 11 (ref 5–15)
BUN: 38 mg/dL — ABNORMAL HIGH (ref 6–20)
CO2: 23 mmol/L (ref 22–32)
Calcium: 8.2 mg/dL — ABNORMAL LOW (ref 8.9–10.3)
Chloride: 103 mmol/L (ref 98–111)
Creatinine, Ser: 1.15 mg/dL (ref 0.61–1.24)
GFR, Estimated: >60 mL/min (ref >60)
Glucose, Bld: 86 mg/dL (ref 70–99)
Potassium: 2.4 mmol/L — CL (ref 3.5–5.1)
Sodium: 137 mmol/L (ref 135–145)
Total Bilirubin: 1.5 mg/dL — ABNORMAL HIGH (ref 0.3–1.2)
Total Protein: 5.3 g/dL — ABNORMAL LOW (ref 6.5–8.1)

## 2020-12-01 LAB — CBC WITH DIFFERENTIAL/PLATELET
Abs Immature Granulocytes: 0.03 10*3/uL (ref 0.00–0.07)
Basophils Absolute: 0.1 10*3/uL (ref 0.0–0.1)
Basophils Relative: 1 %
Eosinophils Absolute: 0 10*3/uL (ref 0.0–0.5)
Eosinophils Relative: 0 %
HCT: 31.8 % — ABNORMAL LOW (ref 39.0–52.0)
Hemoglobin: 11 g/dL — ABNORMAL LOW (ref 13.0–17.0)
Immature Granulocytes: 0 %
Lymphocytes Relative: 5 %
Lymphs Abs: 0.7 10*3/uL (ref 0.7–4.0)
MCH: 29.2 pg (ref 26.0–34.0)
MCHC: 34.6 g/dL (ref 30.0–36.0)
MCV: 84.4 fL (ref 80.0–100.0)
Monocytes Absolute: 0.7 10*3/uL (ref 0.1–1.0)
Monocytes Relative: 4 %
Neutro Abs: 14 10*3/uL — ABNORMAL HIGH (ref 1.7–7.7)
Neutrophils Relative %: 90 %
Platelets: 104 10*3/uL — ABNORMAL LOW (ref 150–400)
RBC: 3.77 MIL/uL — ABNORMAL LOW (ref 4.22–5.81)
RDW: 14.8 % (ref 11.5–15.5)
Smear Review: NORMAL
WBC: 15.6 10*3/uL — ABNORMAL HIGH (ref 4.0–10.5)
nRBC: 0 % (ref 0.0–0.2)

## 2020-12-01 LAB — CBC
HCT: 28.1 % — ABNORMAL LOW (ref 39.0–52.0)
HCT: 32.6 % — ABNORMAL LOW (ref 39.0–52.0)
Hemoglobin: 9.8 g/dL — ABNORMAL LOW (ref 13.0–17.0)
Hemoglobin: 11.3 g/dL — ABNORMAL LOW (ref 13.0–17.0)
MCH: 30 pg (ref 26.0–34.0)
MCH: 29 pg (ref 26.0–34.0)
MCHC: 34.9 g/dL (ref 30.0–36.0)
MCHC: 34.7 g/dL (ref 30.0–36.0)
MCV: 85.9 fL (ref 80.0–100.0)
MCV: 83.8 fL (ref 80.0–100.0)
Platelets: 80 10*3/uL — ABNORMAL LOW (ref 150–400)
RBC: 3.27 MIL/uL — ABNORMAL LOW (ref 4.22–5.81)
RBC: 3.89 MIL/uL — ABNORMAL LOW (ref 4.22–5.81)
RDW: 14.9 % (ref 11.5–15.5)
RDW: 14.9 % (ref 11.5–15.5)
WBC: 8.8 10*3/uL (ref 4.0–10.5)
WBC: 13 10*3/uL — ABNORMAL HIGH (ref 4.0–10.5)
nRBC: 0 % (ref 0.0–0.2)
nRBC: 0 % (ref 0.0–0.2)

## 2020-12-01 LAB — LACTIC ACID, PLASMA: Lactic Acid, Venous: 1.9 mmol/L (ref 0.5–1.9)

## 2020-12-01 LAB — DIC (DISSEMINATED INTRAVASCULAR COAGULATION)PANEL
D-Dimer, Quant: >20 ug/mL-FEU — ABNORMAL HIGH (ref 0.00–0.50)
Fibrinogen: 721 mg/dL — ABNORMAL HIGH (ref 210–475)
INR: 1.3 — ABNORMAL HIGH (ref 0.8–1.2)
Platelets: 77 10*3/uL — ABNORMAL LOW (ref 150–400)
Prothrombin Time: 16.6 seconds — ABNORMAL HIGH (ref 11.4–15.2)
Smear Review: NONE SEEN
aPTT: 41 seconds — ABNORMAL HIGH (ref 24–36)

## 2020-12-01 LAB — LIPID PANEL
Cholesterol: 75 mg/dL (ref 0–200)
HDL: <10 mg/dL — ABNORMAL LOW (ref >40)
Triglycerides: 147 mg/dL (ref <150)
VLDL: 29 mg/dL (ref 0–40)

## 2020-12-01 LAB — ECHOCARDIOGRAM COMPLETE
AR max vel: 3.77 cm2
AV Area VTI: 4.02 cm2
AV Area mean vel: 3.59 cm2
AV Mean grad: 8 mmHg
AV Peak grad: 13.7 mmHg
Ao pk vel: 1.85 m/s
Area-P 1/2: 4.8 cm2
Height: 69 in
S' Lateral: 2.81 cm
Weight: 2589.08 oz

## 2020-12-01 LAB — IMMATURE PLATELET FRACTION: Immature Platelet Fraction: 4.7 % (ref 1.2–8.6)

## 2020-12-01 LAB — PROTIME-INR
INR: 1.4 — ABNORMAL HIGH (ref 0.8–1.2)
Prothrombin Time: 17.1 seconds — ABNORMAL HIGH (ref 11.4–15.2)

## 2020-12-01 LAB — T4, FREE: Free T4: 1.31 ng/dL — ABNORMAL HIGH (ref 0.61–1.12)

## 2020-12-01 LAB — HIV ANTIBODY (ROUTINE TESTING W REFLEX): HIV Screen 4th Generation wRfx: NONREACTIVE

## 2020-12-01 LAB — APTT: aPTT: 40 seconds — ABNORMAL HIGH (ref 24–36)

## 2020-12-01 LAB — PATHOLOGIST SMEAR REVIEW

## 2020-12-01 LAB — HEMOGLOBIN A1C
Hgb A1c MFr Bld: 5.8 % — ABNORMAL HIGH (ref 4.8–5.6)
Mean Plasma Glucose: 119.76 mg/dL

## 2020-12-01 LAB — TROPONIN I (HIGH SENSITIVITY): Troponin I (High Sensitivity): 174 ng/L (ref <18)

## 2020-12-01 LAB — CORTISOL-AM, BLOOD: Cortisol - AM: 62.6 ug/dL — ABNORMAL HIGH (ref 6.7–22.6)

## 2020-12-01 LAB — ABO/RH: ABO/RH(D): A POS

## 2020-12-01 LAB — MRSA NEXT GEN BY PCR, NASAL: MRSA by PCR Next Gen: DETECTED — AB

## 2020-12-01 LAB — GLUCOSE, CAPILLARY: Glucose-Capillary: 97 mg/dL (ref 70–99)

## 2020-12-01 LAB — PROCALCITONIN: Procalcitonin: 22.89 ng/mL

## 2020-12-01 SURGERY — POSTERIOR CERVICAL LAMINECTOMY
Anesthesia: General

## 2020-12-01 SURGERY — POSTERIOR CERVICAL LAMINECTOMY FOR EPIDURAL ABSCESS
Anesthesia: General | Site: Neck

## 2020-12-01 MED ORDER — DOCUSATE SODIUM 50 MG/5ML PO LIQD: 100.0000 mg | Freq: Two times a day (BID) | ORAL | Status: DC

## 2020-12-01 MED ORDER — SODIUM CHLORIDE FLUSH 0.9 % IV SOLN
INTRAVENOUS | Status: AC
  Filled 2020-12-01: qty 20

## 2020-12-01 MED ORDER — POTASSIUM CHLORIDE 10 MEQ/100ML IV SOLN
10.0000 meq | INTRAVENOUS | Status: AC
  Administered 2020-12-01 (×6): 10 meq via INTRAVENOUS
  Filled 2020-12-01 (×6): qty 100

## 2020-12-01 MED ORDER — BUPIVACAINE LIPOSOME 1.3 % IJ SUSP
INTRAMUSCULAR | Status: AC
  Filled 2020-12-01: qty 20

## 2020-12-01 MED ORDER — METRONIDAZOLE 500 MG/100ML IV SOLN
500.0000 mg | Freq: Four times a day (QID) | INTRAVENOUS | Status: DC
Start: 2020-12-01 — End: 2020-12-01
  Administered 2020-12-01 (×2): 500 mg via INTRAVENOUS
  Filled 2020-12-01 (×3): qty 100

## 2020-12-01 MED ORDER — SUCCINYLCHOLINE CHLORIDE 200 MG/10ML IV SOSY
PREFILLED_SYRINGE | INTRAVENOUS | Status: DC | PRN
  Administered 2020-12-01: 100 mg via INTRAVENOUS

## 2020-12-01 MED ORDER — HYDRALAZINE HCL 20 MG/ML IJ SOLN
10.0000 mg | INTRAMUSCULAR | Status: DC | PRN
  Administered 2020-12-01: 10 mg via INTRAVENOUS
  Filled 2020-12-01: qty 1

## 2020-12-01 MED ORDER — ROCURONIUM BROMIDE 100 MG/10ML IV SOLN
INTRAVENOUS | Status: DC | PRN
  Administered 2020-12-01 (×2): 50 mg via INTRAVENOUS

## 2020-12-01 MED ORDER — LABETALOL HCL 5 MG/ML IV SOLN: 10.0000 mg | INTRAVENOUS | Status: DC | PRN

## 2020-12-01 MED ORDER — ORAL CARE MOUTH RINSE
15.0000 mL | OROMUCOSAL | Status: DC
  Administered 2020-12-01 – 2020-12-03 (×10): 15 mL via OROMUCOSAL

## 2020-12-01 MED ORDER — CEFTRIAXONE SODIUM 2 G IJ SOLR: 2.0000 g | Freq: Two times a day (BID) | INTRAMUSCULAR | Status: DC

## 2020-12-01 MED ORDER — MUPIROCIN 2 % EX OINT
1.0000 "application " | TOPICAL_OINTMENT | Freq: Two times a day (BID) | CUTANEOUS | Status: DC
  Administered 2020-12-01 – 2020-12-05 (×9): 1 via NASAL
  Filled 2020-12-01: qty 22

## 2020-12-01 MED ORDER — FENTANYL CITRATE PF 50 MCG/ML IJ SOSY
50.0000 ug | PREFILLED_SYRINGE | Freq: Once | INTRAMUSCULAR | Status: AC
  Administered 2020-12-01: 50 ug via INTRAVENOUS
  Filled 2020-12-01: qty 1

## 2020-12-01 MED ORDER — PROPOFOL 1000 MG/100ML IV EMUL
INTRAVENOUS | Status: AC
  Filled 2020-12-01: qty 200

## 2020-12-01 MED ORDER — DEXMEDETOMIDINE (PRECEDEX) IN NS 20 MCG/5ML (4 MCG/ML) IV SYRINGE
PREFILLED_SYRINGE | INTRAVENOUS | Status: DC | PRN
  Administered 2020-12-01: 12 ug via INTRAVENOUS
  Administered 2020-12-01: 8 ug via INTRAVENOUS

## 2020-12-01 MED ORDER — SODIUM CHLORIDE 0.9% IV SOLUTION: Freq: Once | INTRAVENOUS | Status: AC

## 2020-12-01 MED ORDER — POLYETHYLENE GLYCOL 3350 17 G PO PACK
17.0000 g | PACK | Freq: Every day | ORAL | Status: DC
  Filled 2020-12-01: qty 1

## 2020-12-01 MED ORDER — ACETAMINOPHEN 10 MG/ML IV SOLN
INTRAVENOUS | Status: AC
  Filled 2020-12-01: qty 100

## 2020-12-01 MED ORDER — HYDRALAZINE HCL 20 MG/ML IJ SOLN
INTRAMUSCULAR | Status: AC
  Filled 2020-12-01: qty 2

## 2020-12-01 MED ORDER — BUPIVACAINE-EPINEPHRINE (PF) 0.5% -1:200000 IJ SOLN
INTRAMUSCULAR | Status: DC | PRN
  Administered 2020-12-01: 8 mL

## 2020-12-01 MED ORDER — CHLORHEXIDINE GLUCONATE 0.12% ORAL RINSE (MEDLINE KIT)
15.0000 mL | Freq: Two times a day (BID) | OROMUCOSAL | Status: DC
  Administered 2020-12-01: 15 mL via OROMUCOSAL

## 2020-12-01 MED ORDER — DEXMEDETOMIDINE (PRECEDEX) IN NS 20 MCG/5ML (4 MCG/ML) IV SYRINGE
PREFILLED_SYRINGE | INTRAVENOUS | Status: AC
  Filled 2020-12-01: qty 5

## 2020-12-01 MED ORDER — FENTANYL CITRATE (PF) 100 MCG/2ML IJ SOLN
INTRAMUSCULAR | Status: DC | PRN
  Administered 2020-12-01 (×2): 50 ug via INTRAVENOUS

## 2020-12-01 MED ORDER — MIDAZOLAM HCL 2 MG/2ML IJ SOLN
INTRAMUSCULAR | Status: AC
  Filled 2020-12-01: qty 2

## 2020-12-01 MED ORDER — LABETALOL HCL 5 MG/ML IV SOLN
10.0000 mg | INTRAVENOUS | Status: DC | PRN
  Administered 2020-12-01: 10 mg via INTRAVENOUS
  Filled 2020-12-01: qty 4

## 2020-12-01 MED ORDER — PROPOFOL 10 MG/ML IV BOLUS
INTRAVENOUS | Status: DC | PRN
  Administered 2020-12-01: 130 mg via INTRAVENOUS

## 2020-12-01 MED ORDER — HYDRALAZINE HCL 20 MG/ML IJ SOLN
10.0000 mg | INTRAMUSCULAR | Status: DC | PRN
Start: 2020-12-01 — End: 2020-12-03
  Administered 2020-12-01: 40 mg via INTRAVENOUS

## 2020-12-01 MED ORDER — MAGNESIUM SULFATE 2 GM/50ML IV SOLN
2.0000 g | Freq: Once | INTRAVENOUS | Status: AC
  Administered 2020-12-01: 2 g via INTRAVENOUS
  Filled 2020-12-01: qty 50

## 2020-12-01 MED ORDER — DEXAMETHASONE SODIUM PHOSPHATE 10 MG/ML IJ SOLN
INTRAMUSCULAR | Status: DC | PRN
  Administered 2020-12-01: 10 mg via INTRAVENOUS

## 2020-12-01 MED ORDER — POTASSIUM CHLORIDE 10 MEQ/100ML IV SOLN: 10.0000 meq | INTRAVENOUS | Status: DC

## 2020-12-01 MED ORDER — KCL IN DEXTROSE-NACL 40-5-0.45 MEQ/L-%-% IV SOLN
INTRAVENOUS | Status: DC
  Filled 2020-12-01 (×11): qty 1000

## 2020-12-01 MED ORDER — BACITRACIN ZINC 500 UNIT/GM EX OINT
TOPICAL_OINTMENT | CUTANEOUS | Status: AC
  Filled 2020-12-01: qty 28.35

## 2020-12-01 MED ORDER — NICARDIPINE HCL IN NACL 20-0.86 MG/200ML-% IV SOLN
3.0000 mg/h | INTRAVENOUS | Status: DC
  Administered 2020-12-01: 3 mg/h via INTRAVENOUS
  Administered 2020-12-01: 2.5 mg/h via INTRAVENOUS
  Administered 2020-12-02 (×2): 5 mg/h via INTRAVENOUS
  Administered 2020-12-02: 19:00:00 7.5 mg/h via INTRAVENOUS
  Administered 2020-12-02 – 2020-12-03 (×3): 5 mg/h via INTRAVENOUS
  Filled 2020-12-01 (×9): qty 200

## 2020-12-01 MED ORDER — ONDANSETRON HCL 4 MG/2ML IJ SOLN
INTRAMUSCULAR | Status: DC | PRN
  Administered 2020-12-01: 4 mg via INTRAVENOUS

## 2020-12-01 MED ORDER — LABETALOL HCL 5 MG/ML IV SOLN
10.0000 mg | INTRAVENOUS | Status: DC | PRN
Start: 2020-12-01 — End: 2020-12-12
  Administered 2020-12-01 – 2020-12-07 (×5): 20 mg via INTRAVENOUS
  Filled 2020-12-01 (×5): qty 4

## 2020-12-01 MED ORDER — MIDAZOLAM HCL 2 MG/2ML IJ SOLN
INTRAMUSCULAR | Status: DC | PRN
  Administered 2020-12-01: 2 mg via INTRAVENOUS

## 2020-12-01 MED ORDER — MORPHINE SULFATE (PF) 2 MG/ML IV SOLN
1.0000 mg | INTRAVENOUS | Status: DC | PRN
Start: 2020-12-01 — End: 2020-12-12
  Administered 2020-12-01 – 2020-12-02 (×4): 1 mg via INTRAVENOUS
  Administered 2020-12-03 – 2020-12-11 (×20): 2 mg via INTRAVENOUS
  Filled 2020-12-01 (×24): qty 1

## 2020-12-01 MED ORDER — SEVOFLURANE IN SOLN
RESPIRATORY_TRACT | Status: AC
  Filled 2020-12-01: qty 250

## 2020-12-01 MED ORDER — FENTANYL CITRATE (PF) 100 MCG/2ML IJ SOLN
INTRAMUSCULAR | Status: AC
  Filled 2020-12-01: qty 2

## 2020-12-01 MED ORDER — FENTANYL CITRATE PF 50 MCG/ML IJ SOSY
50.0000 ug | PREFILLED_SYRINGE | INTRAMUSCULAR | Status: AC | PRN
  Administered 2020-12-01 – 2020-12-02 (×3): 50 ug via INTRAVENOUS
  Filled 2020-12-01 (×3): qty 1

## 2020-12-01 MED ORDER — REMIFENTANIL HCL 1 MG IV SOLR
INTRAVENOUS | Status: AC
  Filled 2020-12-01: qty 1000

## 2020-12-01 MED ORDER — PROPOFOL 1000 MG/100ML IV EMUL
0.0000 ug/kg/min | INTRAVENOUS | Status: DC
  Administered 2020-12-01: 10 ug/kg/min via INTRAVENOUS
  Administered 2020-12-02: 03:00:00 20 ug/kg/min via INTRAVENOUS
  Filled 2020-12-01: qty 100

## 2020-12-01 MED ORDER — ACETAMINOPHEN 10 MG/ML IV SOLN
INTRAVENOUS | Status: DC | PRN
  Administered 2020-12-01: 1000 mg via INTRAVENOUS

## 2020-12-01 MED ORDER — BUPIVACAINE HCL (PF) 0.5 % IJ SOLN
INTRAMUSCULAR | Status: AC
  Filled 2020-12-01: qty 30

## 2020-12-01 MED ORDER — DEXAMETHASONE SODIUM PHOSPHATE 10 MG/ML IJ SOLN
INTRAMUSCULAR | Status: AC
  Filled 2020-12-01: qty 1

## 2020-12-01 MED ORDER — BUPIVACAINE-EPINEPHRINE (PF) 0.5% -1:200000 IJ SOLN
INTRAMUSCULAR | Status: AC
  Filled 2020-12-01: qty 30

## 2020-12-01 MED ORDER — ONDANSETRON HCL 4 MG/2ML IJ SOLN
INTRAMUSCULAR | Status: AC
  Filled 2020-12-01: qty 2

## 2020-12-01 MED ORDER — PROPOFOL 10 MG/ML IV BOLUS
INTRAVENOUS | Status: AC
  Filled 2020-12-01: qty 40

## 2020-12-01 MED ORDER — ORAL CARE MOUTH RINSE
15.0000 mL | Freq: Two times a day (BID) | OROMUCOSAL | Status: DC
  Administered 2020-12-01: 15 mL via OROMUCOSAL

## 2020-12-01 MED ORDER — THROMBIN 5000 UNITS EX SOLR
CUTANEOUS | Status: AC
  Filled 2020-12-01: qty 5000

## 2020-12-01 MED ORDER — 0.9 % SODIUM CHLORIDE (POUR BTL) OPTIME
TOPICAL | Status: DC | PRN
  Administered 2020-12-01: 1000 mL

## 2020-12-01 MED ORDER — LIDOCAINE HCL (CARDIAC) PF 100 MG/5ML IV SOSY
PREFILLED_SYRINGE | INTRAVENOUS | Status: DC | PRN
  Administered 2020-12-01: 100 mg via INTRAVENOUS

## 2020-12-01 MED ORDER — PROPOFOL 10 MG/ML IV BOLUS
INTRAVENOUS | Status: AC
  Filled 2020-12-01: qty 20

## 2020-12-01 MED ORDER — SODIUM CHLORIDE 0.9% IV SOLUTION
Freq: Once | INTRAVENOUS | Status: AC
  Filled 2020-12-01: qty 250

## 2020-12-01 MED ORDER — LIDOCAINE-EPINEPHRINE 1 %-1:100000 IJ SOLN
INTRAMUSCULAR | Status: AC
  Filled 2020-12-01: qty 1

## 2020-12-01 MED ORDER — SODIUM CHLORIDE 0.9 % IV SOLN: 2.0000 g | Freq: Two times a day (BID) | INTRAVENOUS | Status: DC

## 2020-12-01 MED ORDER — POTASSIUM CHLORIDE 10 MEQ/100ML IV SOLN
10.0000 meq | INTRAVENOUS | Status: AC
  Administered 2020-12-01 (×3): 10 meq via INTRAVENOUS
  Filled 2020-12-01 (×4): qty 100

## 2020-12-01 MED ORDER — SODIUM CHLORIDE 0.9 % IV SOLN
600.0000 mg | Freq: Three times a day (TID) | INTRAVENOUS | Status: AC
  Administered 2020-12-01 – 2020-12-14 (×41): 600 mg via INTRAVENOUS
  Filled 2020-12-01 (×51): qty 20

## 2020-12-01 MED ORDER — SURGIFLO WITH THROMBIN (HEMOSTATIC MATRIX KIT) OPTIME
TOPICAL | Status: DC | PRN
  Administered 2020-12-01: 1 via TOPICAL

## 2020-12-01 MED ORDER — FENTANYL CITRATE PF 50 MCG/ML IJ SOSY
50.0000 ug | PREFILLED_SYRINGE | INTRAMUSCULAR | Status: DC | PRN
  Administered 2020-12-02: 05:00:00 100 ug via INTRAVENOUS
  Administered 2020-12-02 (×2): 50 ug via INTRAVENOUS
  Filled 2020-12-01: qty 1
  Filled 2020-12-01 (×2): qty 2

## 2020-12-01 MED ORDER — LACTATED RINGERS IV SOLN: INTRAVENOUS | Status: DC | PRN

## 2020-12-01 MED ORDER — SODIUM CHLORIDE 0.9 % IV SOLN
2.0000 g | Freq: Three times a day (TID) | INTRAVENOUS | Status: DC
Start: 2020-12-01 — End: 2020-12-01

## 2020-12-01 MED ORDER — MORPHINE SULFATE (PF) 2 MG/ML IV SOLN
INTRAVENOUS | Status: AC
  Filled 2020-12-01: qty 1

## 2020-12-01 MED ORDER — PRONTOSAN WOUND IRRIGATION OPTIME
TOPICAL | Status: DC | PRN
  Administered 2020-12-01: 1 via TOPICAL

## 2020-12-01 SURGICAL SUPPLY — 67 items
ADH SKN CLS APL DERMABOND .7 (GAUZE/BANDAGES/DRESSINGS) ×2
AGENT HMST KT MTR STRL THRMB (HEMOSTASIS) ×2
APL PRP STRL LF DISP 70% ISPRP (MISCELLANEOUS) ×4
BULB RESERV EVAC DRAIN JP 100C (MISCELLANEOUS) IMPLANT
BUR NEURO DRILL SOFT 3.0X3.8M (BURR) ×3 IMPLANT
CHLORAPREP W/TINT 26 (MISCELLANEOUS) ×6 IMPLANT
COUNTER NEEDLE 20/40 LG (NEEDLE) ×3 IMPLANT
DERMABOND ADVANCED (GAUZE/BANDAGES/DRESSINGS) ×1
DERMABOND ADVANCED .7 DNX12 (GAUZE/BANDAGES/DRESSINGS) ×2 IMPLANT
DRAIN CHANNEL JP 10F RND 20C F (MISCELLANEOUS) IMPLANT
DRAPE C ARM PK CFD 31 SPINE (DRAPES) ×3 IMPLANT
DRAPE LAPAROTOMY 100X77 ABD (DRAPES) ×3 IMPLANT
DRAPE MICROSCOPE SPINE 48X150 (DRAPES) IMPLANT
DRAPE SURG 17X11 SM STRL (DRAPES) ×6 IMPLANT
DRSG OPSITE POSTOP 3X4 (GAUZE/BANDAGES/DRESSINGS) IMPLANT
DRSG OPSITE POSTOP 4X6 (GAUZE/BANDAGES/DRESSINGS) ×3 IMPLANT
DRSG TEGADERM 2-3/8X2-3/4 SM (GAUZE/BANDAGES/DRESSINGS) ×3 IMPLANT
DRSG TELFA 3X8 NADH (GAUZE/BANDAGES/DRESSINGS) ×3 IMPLANT
ELECT CAUTERY BLADE TIP 2.5 (TIP)
ELECTRODE CAUTERY BLDE TIP 2.5 (TIP) IMPLANT
FEE INTRAOP CADWELL SUPPLY NCS (MISCELLANEOUS) ×2 IMPLANT
FEE INTRAOP MONITOR IMPULS NCS (MISCELLANEOUS) IMPLANT
GAUZE 4X4 16PLY ~~LOC~~+RFID DBL (SPONGE) ×3 IMPLANT
GAUZE XEROFORM 4X4 STRL (GAUZE/BANDAGES/DRESSINGS) IMPLANT
GLOVE SURG SYN 6.5 ES PF (GLOVE) ×3 IMPLANT
GLOVE SURG SYN 8.5  E (GLOVE) ×3
GLOVE SURG SYN 8.5 E (GLOVE) ×6 IMPLANT
GLOVE SURG UNDER POLY LF SZ6.5 (GLOVE) ×3 IMPLANT
GLOVE SURG UNDER POLY LF SZ8.5 (GLOVE) ×3 IMPLANT
GOWN SRG LRG LVL 4 IMPRV REINF (GOWNS) ×2 IMPLANT
GOWN SRG XL LVL 3 NONREINFORCE (GOWNS) ×2 IMPLANT
GOWN STRL NON-REIN TWL XL LVL3 (GOWNS) ×3
GOWN STRL REIN LRG LVL4 (GOWNS) ×3
GRADUATE 1200CC STRL 31836 (MISCELLANEOUS) ×3 IMPLANT
HEMOVAC 400CC 10FR (MISCELLANEOUS) ×3 IMPLANT
INTRAOP CADWELL SUPPLY FEE NCS (MISCELLANEOUS) ×2
INTRAOP DISP SUPPLY FEE NCS (MISCELLANEOUS) ×3
INTRAOP MONITOR FEE IMPULS NCS (MISCELLANEOUS)
INTRAOP MONITOR FEE IMPULSE (MISCELLANEOUS)
KIT TURNOVER KIT A (KITS) ×3 IMPLANT
MANIFOLD NEPTUNE II (INSTRUMENTS) ×3 IMPLANT
MARKER SKIN DUAL TIP RULER LAB (MISCELLANEOUS) ×6 IMPLANT
NDL SAFETY ECLIPSE 18X1.5 (NEEDLE) ×2 IMPLANT
NEEDLE HYPO 18GX1.5 SHARP (NEEDLE) ×3
NEEDLE HYPO 22GX1.5 SAFETY (NEEDLE) ×3 IMPLANT
NS IRRIG 1000ML POUR BTL (IV SOLUTION) ×3 IMPLANT
PACK LAMINECTOMY NEURO (CUSTOM PROCEDURE TRAY) ×3 IMPLANT
PAD ARMBOARD 7.5X6 YLW CONV (MISCELLANEOUS) IMPLANT
PIN MAYFIELD SKULL DISP (PIN) IMPLANT
SOLUTION PRONTOSAN WOUND 350ML (IRRIGATION / IRRIGATOR) ×3 IMPLANT
SPONGE GAUZE 2X2 8PLY STRL LF (GAUZE/BANDAGES/DRESSINGS) ×3 IMPLANT
STAPLER SKIN PROX 35W (STAPLE) IMPLANT
SURGIFLO W/THROMBIN 8M KIT (HEMOSTASIS) ×3 IMPLANT
SUT ETHILON 3-0 FS-10 30 BLK (SUTURE) ×3
SUT V-LOC 90 ABS DVC 3-0 CL (SUTURE) ×3 IMPLANT
SUT VIC AB 0 CT1 18XCR BRD 8 (SUTURE) ×2 IMPLANT
SUT VIC AB 0 CT1 27 (SUTURE) ×3
SUT VIC AB 0 CT1 27XCR 8 STRN (SUTURE) ×2 IMPLANT
SUT VIC AB 0 CT1 8-18 (SUTURE) ×3
SUT VIC AB 2-0 CT1 18 (SUTURE) ×6 IMPLANT
SUTURE EHLN 3-0 FS-10 30 BLK (SUTURE) ×2 IMPLANT
SWAB CULTURE AMIES ANAERIB BLU (MISCELLANEOUS) ×3 IMPLANT
TAPE CLOTH 3X10 WHT NS LF (GAUZE/BANDAGES/DRESSINGS) ×6 IMPLANT
TOWEL OR 17X26 4PK STRL BLUE (TOWEL DISPOSABLE) ×6 IMPLANT
TRAY FOLEY MTR SLVR 16FR STAT (SET/KITS/TRAYS/PACK) IMPLANT
TUBING CONNECTING 10 (TUBING) ×3 IMPLANT
WATER STERILE IRR 500ML POUR (IV SOLUTION) IMPLANT

## 2020-12-01 SURGICAL SUPPLY — 65 items
BUR NEURO DRILL SOFT 3.0X3.8M (BURR) IMPLANT
BUR SABER DIAMOND 3.0 (BURR) IMPLANT
CHLORAPREP W/TINT 26 (MISCELLANEOUS) IMPLANT
COUNTER NEEDLE 20/40 LG (NEEDLE) IMPLANT
COVER LIGHT HANDLE STERIS (MISCELLANEOUS) IMPLANT
CUP MEDICINE 2OZ PLAST GRAD ST (MISCELLANEOUS) IMPLANT
DERMABOND ADVANCED (GAUZE/BANDAGES/DRESSINGS)
DERMABOND ADVANCED .7 DNX12 (GAUZE/BANDAGES/DRESSINGS) IMPLANT
DRAPE C-ARM 42X72 X-RAY (DRAPES) IMPLANT
DRAPE C-ARMOR (DRAPES) IMPLANT
DRAPE INCISE IOBAN 66X45 STRL (DRAPES) IMPLANT
DRAPE MICROSCOPE SPINE 48X150 (DRAPES) IMPLANT
DRAPE THYROID T SHEET (DRAPES) IMPLANT
DRSG OPSITE POSTOP 4X6 (GAUZE/BANDAGES/DRESSINGS) IMPLANT
DRSG OPSITE POSTOP 4X8 (GAUZE/BANDAGES/DRESSINGS) IMPLANT
ELECT CAUTERY BLADE TIP 2.5 (TIP)
ELECT COATED BLADE 2.86 ST (ELECTRODE) IMPLANT
ELECT EZSTD 165MM 6.5IN (MISCELLANEOUS)
ELECT REM PT RETURN 9FT ADLT (ELECTROSURGICAL)
ELECTRODE CAUTERY BLDE TIP 2.5 (TIP) IMPLANT
ELECTRODE EZSTD 165MM 6.5IN (MISCELLANEOUS) IMPLANT
ELECTRODE REM PT RTRN 9FT ADLT (ELECTROSURGICAL) IMPLANT
FEE INTRAOP CADWELL SUPPLY NCS (MISCELLANEOUS) IMPLANT
FEE INTRAOP MONITOR IMPULS NCS (MISCELLANEOUS) IMPLANT
GAUZE 4X4 16PLY ~~LOC~~+RFID DBL (SPONGE) IMPLANT
GAUZE SPONGE 4X4 12PLY STRL (GAUZE/BANDAGES/DRESSINGS) IMPLANT
GLOVE SRG 8 PF TXTR STRL LF DI (GLOVE) IMPLANT
GLOVE SURG SYN 6.5 ES PF (GLOVE) IMPLANT
GLOVE SURG SYN 7.0 (GLOVE) IMPLANT
GLOVE SURG SYN 8.0 (GLOVE) IMPLANT
GLOVE SURG UNDER POLY LF SZ6.5 (GLOVE) IMPLANT
GLOVE SURG UNDER POLY LF SZ8 (GLOVE)
GOWN SRG LRG LVL 4 IMPRV REINF (GOWNS) IMPLANT
GOWN STRL REIN LRG LVL4 (GOWNS)
GOWN STRL REUS W/ TWL LRG LVL3 (GOWN DISPOSABLE) IMPLANT
GOWN STRL REUS W/ TWL XL LVL3 (GOWN DISPOSABLE) IMPLANT
GOWN STRL REUS W/TWL LRG LVL3 (GOWN DISPOSABLE)
GOWN STRL REUS W/TWL XL LVL3 (GOWN DISPOSABLE)
GRADUATE 1200CC STRL 31836 (MISCELLANEOUS) IMPLANT
INTRAOP CADWELL SUPPLY FEE NCS (MISCELLANEOUS)
INTRAOP DISP SUPPLY FEE NCS (MISCELLANEOUS)
INTRAOP MONITOR FEE IMPULS NCS (MISCELLANEOUS)
INTRAOP MONITOR FEE IMPULSE (MISCELLANEOUS)
KIT TURNOVER KIT A (KITS) IMPLANT
MANIFOLD NEPTUNE II (INSTRUMENTS) IMPLANT
MARKER SKIN DUAL TIP RULER LAB (MISCELLANEOUS) IMPLANT
NEEDLE HYPO 22GX1.5 SAFETY (NEEDLE) IMPLANT
PACK LAMINECTOMY NEURO (CUSTOM PROCEDURE TRAY) IMPLANT
PAD ARMBOARD 7.5X6 YLW CONV (MISCELLANEOUS) IMPLANT
PIN MAYFIELD SKULL DISP (PIN) IMPLANT
STAPLER SKIN PROX 35W (STAPLE) IMPLANT
SURGIFLO W/THROMBIN 8M KIT (HEMOSTASIS) IMPLANT
SUT ETHILON 3 0 PS 1 (SUTURE) IMPLANT
SUT NURALON 4 0 TR CR/8 (SUTURE) IMPLANT
SUT POLYSORB 2-0 5X18 GS-10 (SUTURE) IMPLANT
SUT PROLENE 5 0 RB 2 (SUTURE) IMPLANT
SUT VIC AB 0 CT1 18XCR BRD 8 (SUTURE) IMPLANT
SUT VIC AB 0 CT1 8-18 (SUTURE)
SWAB CULTURE AMIES ANAERIB BLU (MISCELLANEOUS) IMPLANT
SYR 30ML LL (SYRINGE) IMPLANT
TAPE CLOTH 3X10 WHT NS LF (GAUZE/BANDAGES/DRESSINGS) IMPLANT
TOWEL OR 17X26 4PK STRL BLUE (TOWEL DISPOSABLE) IMPLANT
TRAY FOLEY MTR SLVR 16FR STAT (SET/KITS/TRAYS/PACK) IMPLANT
TUBING CONNECTING 10 (TUBING) IMPLANT
WATER STERILE IRR 500ML POUR (IV SOLUTION) IMPLANT

## 2020-12-01 NOTE — Progress Notes (Signed)
Attending:    Subjective: Admitted overnight for management of hypertension, confusion, thrombocytopenia in setting of MRSA bacteremia and a cervical spine epidural abscess  Receiving platelet transfusions   Agitated intermittently  Would like to eat/drink    Objective: Vitals:   12/01/20 0800 12/01/20 0930 12/01/20 0945 12/01/20 1000  BP: (!) 186/120 (!) 161/98 (!) 167/97 (!) 144/99  Pulse: (!) 116 (!) 109 (!) 109 (!) 116  Resp: (!) 26 (!) 21 (!) 32 (!) 24  Temp:   98.4 F (36.9 C)   TempSrc:   Oral   SpO2: 94% 92% 94% 90%  Weight:      Height:          Intake/Output Summary (Last 24 hours) at 12/01/2020 1309 Last data filed at 12/01/2020 1141 Gross per 24 hour  Intake 3394.03 ml  Output 3150 ml  Net 244.03 ml    General:  Resting  in bed HENT: NCAT OP clear PULM: CTA B, normal effort CV: RRR, no mgr GI: BS+, soft, nontender MSK: normal bulk and tone Derm: multiple skin wounds, scabs Neuro: drowsy but will wake up and follow commands, moves all four extremities   CBC    Component Value Date/Time   WBC 15.6 (H) 12/01/2020 1047   RBC 3.77 (L) 12/01/2020 1047   HGB 11.0 (L) 12/01/2020 1047   HCT 31.8 (L) 12/01/2020 1047   PLT 104 (L) 12/01/2020 1047   MCV 84.4 12/01/2020 1047   MCH 29.2 12/01/2020 1047   MCHC 34.6 12/01/2020 1047   RDW 14.8 12/01/2020 1047   LYMPHSABS 0.7 12/01/2020 1047   MONOABS 0.7 12/01/2020 1047   EOSABS 0.0 12/01/2020 1047   BASOSABS 0.1 12/01/2020 1047    BMET    Component Value Date/Time   NA 136 12/01/2020 1047   K 2.3 (LL) 12/01/2020 1047   CL 102 12/01/2020 1047   CO2 23 12/01/2020 1047   GLUCOSE 83 12/01/2020 1047   BUN 34 (H) 12/01/2020 1047   CREATININE 1.01 12/01/2020 1047   CALCIUM 8.1 (L) 12/01/2020 1047   GFRNONAA >60 12/01/2020 1047   INR 1.4  CXR images bilateral interstitial opacification  MRI C-spine C2-5 circumfrential epidural abscess, no spinal cord signal change MRI Brain  NAICP  Impression/Plan: MRSA bacteremia with cervical epidural abscess > continue vanc per ID, transfuse platelets now so he is safe to go to the OR for drainage by NSGY Hypokalemia> replace aggressively Thrombocytopenia> presumably due to sepsis Elevated AST> shock liver? Though his wife denies alcohol use his picture could be consistent with alcohol abuse/cirrhosis.  Will check RUQ ultrasound Acute metabolic encephalopathy> in setting of underlying opiate abuse, question whether or not he is having withdrawal from opiate or another substance.  Give morphine prn, frequent orientation, aspiration precautions, lights on during daytime, off at night, minimize other sedatives  Wife updated bedside  My cc time 35 minutes  Roselie Awkward, MD [facility] [facility] Pager: [phone] Cell: [phone] After 7pm: [phone]

## 2020-12-01 NOTE — Progress Notes (Signed)
CRITICAL RESULT PROVIDER NOTIFICATION  Test performed and critical result: K+ 2.4  Date and time result received:  12-01-20 0309  Provider name/title: Dr Lacinda Axon  Date and time provider notified: 12-01-20 9906  Date and time provider responded: 12-01-20 0312  Provider response:Evaluate remotely

## 2020-12-01 NOTE — Transfer of Care (Signed)
Immediate Anesthesia Transfer of Care Note  Patient: Ocie Tino  Procedure(s) Performed: POSTERIOR CERVICAL LAMINECTOMY FOR EPIDURAL ABSCESS C2-C4 (Neck)  Patient Location: ICU  Anesthesia Type:General  Level of Consciousness: Patient remains intubated per anesthesia plan  Airway & Oxygen Therapy: Patient placed on Ventilator (see vital sign flow sheet for setting)  Post-op Assessment: Report given to RN and Post -op Vital signs reviewed and stable  Post vital signs: Reviewed and stable  Last Vitals:  Vitals Value Taken Time  BP 118/69 12/01/20 1854  Temp    Pulse 112 12/01/20 1854  Resp 13 12/01/20 1850  SpO2 96 % 12/01/20 1854  Vitals shown include unvalidated device data.  Last Pain:  Vitals:   12/01/20 1619  TempSrc: Oral  PainSc:       Patients Stated Pain Goal: 0 (62/13/08 6578)  Complications: No notable events documented.

## 2020-12-01 NOTE — Op Note (Signed)
Indications: Mr. Shedden is a 45 yo male who presented with sepsis and epidural abscess. Blood cultures showed MRSA bacteremia.    After discussion with infectious disease, washout of the epidural abscess was recommended for source control.  Findings: epidural abscess  Preoperative Diagnosis: Cervical epidural abscess Postoperative Diagnosis: same   EBL: 50 ml IVF: 600 ml Drains: 1 placed Disposition: Intubated, critically ill to ICU Complications: none  No foley catheter was placed.   Preoperative Note:   Risks of surgery discussed include: infection, bleeding, stroke, coma, death, paralysis, CSF leak, nerve/spinal cord injury, numbness, tingling, weakness, complex regional pain syndrome, recurrent stenosis and/or disc herniation, vascular injury, development of instability, neck/back pain, need for further surgery, persistent symptoms, development of deformity, and the risks of anesthesia. The patient's wife understood these risks and agreed to proceed.  Operative Note:   OPERATIVE PROCEDURE:  1. Posterior hemilaminectomy on R from C2 to C4 for epidural abscess drainage  OPERATIVE PROCEDURE:  After induction of general anesthesia, the patient was rolled carefully to the operative bed. A midline incision was then planned using fluoroscopy.  A timeout was performed, and antibiotics given.  Next, the posterior cervical region was prepped and draped in the usual sterile fashion. The incision was injected with local anesthetic, the opened sharply. A subperiosteal dissection was then carried out to expose the right C2-4 lamina.   After exposure was confirmed with flouroscopy, the high speed drill was used to remove the R lamina from C2 to C4.  The ligamentum was removed, then pus was expressed.  This was cultured and sent for pathology.  After the pus was removed, the spinal cord was palpated with a nerve hook.  We then obtained hemostasis.  Copious irrigation was used.  A drain was  placed.  The wound was closed in a multilayer fashion using interrupted 0 and 2-0 Vicryl sutures.  The final skin edges were reapproximated using 3-0 monocryl.   Dermabond was placed on the wound for skin closure.  After closure, the patient was flipped supine and left intubated for transfer to ICU.  Cooper Render PA acted as an Pensions consultant throughout the case.   Meade Maw MD

## 2020-12-01 NOTE — Progress Notes (Signed)
ID Discussed with Dr.Ouma and chart reviewed 45 yr male admitted with falls, neck and back pain and confusion and AMS- had a temp of 104 in the ED- initial CT head and neck without contrast was not yielding Dr.Ouma astutely ordered MRI of the neck and brain with contrast and it shows Circumferential epidural abscess of the cervical spine extending from C2-C5, measuring up to 6 mm in thickness at the C2 and C3 levels and effacing the cervical spinal canal. Pt received vanco/cefepime- Metronidazole has been added. Will continue the same Pt is going for laminectomy  by Dr.Cook- To send tissue and pus for aerobic/anerobic bacterial culture and to save samples for AFB/Fungal if needed CT chest shows pulmonary nodules- need to r/o septic emboli - will need 2 d echo  Will see the patient later this morning.

## 2020-12-01 NOTE — Progress Notes (Signed)
NAME:  Caleb Fisher, MRN:  [id number], DOB:  1975/06/28, LOS: 1 ADMISSION DATE:  11/30/2020 CONSULTATION DATE: 12/01/2020 REFERRING MD:  Doristine Mango MD CHIEF COMPLAINT:  Spinal Abscess, MRSA bacteremia    BRIEF SYNOPSIS: 45 y/o M presented to ED with AMS, neck and back pain. Noted to be septic, started on broad spectrum abx. Found to have MRSA bacteremia and cervical epidural abscess. Being seen by ID and neurosurg for possible laminectomy with washout once K+ and platelets stabilize. Currently on vancomycin.   History of Present Illness:  Caleb Fisher is a 45 year old male smoker with no significant past medical history presenting with altered mental status, neck pain, frequent falls and sepsis.  He was started on broad antibiotic coverage, attempted LP in the ED and IR which was unsuccessful CT imaging with no acute process.  He then had a MRI spine showing epidural cervical abscess around C2-C5 Neurosurgery consulted and he was taken to the OR however labs in the PACU revealed thrombocytopenia and hypokalemia hence surgery was canceled and he was transferred to ICU for further stabilization and correction of potassium and platelets.  Pertinent  Medical History  No pertinent past medical history reported by wife  Current smoker  Opioid abuse   Significant Hospital Events: Including procedures, antibiotic start and stop dates in addition to other pertinent events   11/15: seen in ED for AMS, back & neck pain x2 days  11/16: admitted to ICU for management of MRSA bacteremia & cervical epidural abscess. Given multiple units of platelets and potassium to stabilize prior to possible neurosurgery laminectomy with washout.    Micro Data:  11/15: blood cultures - MRSA +  11/16: urine culture- pending  11/16: HIV negative   Antimicrobials:  11/15: Cefepime x1, metronidazole, ceftriaxone 11/16: vancomycin   Interim History / Subjective:  Patient admitted to ICU for management of MRSA  bacteremia & cervical epidural abscess. Given multiple units of platelets and potassium to stabilize prior to possible neurosurgery laminectomy with washout.    Objective   Blood pressure (!) 143/95, pulse (!) 114, temperature 98.7 F (37.1 C), temperature source Oral, resp. rate (!) 25, height 5\' 9"  (1.753 m), weight 73.4 kg, SpO2 91 %.        Intake/Output Summary (Last 24 hours) at 12/01/2020 1608 Last data filed at 12/01/2020 1600 Gross per 24 hour  Intake 3394.03 ml  Output 4250 ml  Net -855.97 ml   Filed Weights   11/30/20 1311 11/30/20 2216  Weight: 72.6 kg 73.4 kg   REVIEW OF SYSTEMS: Unable to attain d/t metabolic encephalopathy   PHYSICAL EXAMINATION:  GENERAL: 45 year-old critically ill patient lying in the bed restless.  EYES: Pupils equal, round, reactive to light and accommodation. No scleral icterus. Extraocular muscles intact.  HEENT: Head atraumatic, normocephalic. Oropharynx and nasopharynx clear.  NECK:  Supple, no jugular venous distention. No thyroid enlargement, no tenderness.  LUNGS: Rhonchi bilaterally. No use of accessory muscles of respiration.  CARDIOVASCULAR: S1, S2 normal. Sinus tachycardia. No murmurs, rubs, or gallops.  ABDOMEN: Soft, nontender, nondistended. Bowel sounds present. No organomegaly or mass.  EXTREMITIES: No pedal edema, cyanosis, or clubbing. Scattered skin lesions to arms.  NEUROLOGIC: moving all limbs spontaneously but not to command. Mumbles when asked questions, occasionally understandable. Able to follow some commands.    Labs/imaging that I havepersonally reviewed  (right click and "Reselect all SmartList Selections" daily)  11/15: UDS- positive for opioids  11/15: MRI cervical spine- C-spine C2-5 circumfrential epidural abscess, no spinal  cord signal change 11/15: MRI brain- No acute intracranial abnormality. 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 11/16: Echo- results pending    Resolved Hospital Problem list    SYNOPSIS: 45 y/o M presented to ED with AMS, neck and back pain. Noted to be septic, started on broad spectrum abx. Found to have MRSA bacteremia and cervical epidural abscess. Being seen by ID and neurosurg for possible laminectomy with washout once K+ and platelets stabilize. Currently on vancomycin.   ASSESSMENT AND PLAN  Sepsis  MRSA bacteremia with epidural spinal abscess  - ID consult - per ID continue vancomycin  - Neurosurgery consult- will likely go to the OR for cervical laminectomy & washout once platelets and K+ are stable  - Follow cultures, fever, curve, trend WBCs - Source unknown - wife denies known IVDU, skin excoriations? Skin culture obtained  - Echo- r/o septic emboli; pulmonary nodules noted on CT   Acute metabolic encephalopathy  - Query opioid withdrawal vs. Meningeal spread of bacteria  -LP unable to be attained after multiple attempts  -Continue vancomycin  - Give morphine PRN, aspiration precautions  Hypokalemia  - Replace K+ aggressively  - Pharmacy consult   Thrombocytopenia - Likely from sepsis/infection  - Replace platelets as needed to stabilize   Transaminitis  - Elevated AST - Could be from liver shock or alcohol abuse although wife says he seldom drinks  - Will check RUQ [place]  AKI - Improved with IV fluids  - Trend CMP  Hypertension  - Per neurosurgery MAP goal: 70-110 - Maintain with PRN hydralazine & labetalol  - Start nicardipine gtt if needed    Best practice (right click and "Reselect all SmartList Selections" daily)  Diet: NPO Pain/Anxiety/Delirium protocol (if indicated): Yes (RASS goal 0) VAP protocol (if indicated): Not indicated DVT prophylaxis: Contraindicated GI prophylaxis: N/A Glucose control:  SSI No Central venous access:  N/A Arterial line:  N/A Foley:  N/A Mobility:  bed rest  Code Status:  FULL Disposition:ICU  Labs   CBC: Recent Labs  Lab 11/30/20 1310 12/01/20 0030  12/01/20 0147 12/01/20 0157 12/01/20 1047  WBC 14.2* 8.8  --  13.0* 15.6*  NEUTROABS 12.8*  --   --   --  14.0*  HGB 12.8* 9.8*  --  11.3* 11.0*  HCT 36.6* 28.1*  --  32.6* 31.8*  MCV 85.1 85.9  --  83.8 84.4  PLT 125* NO PLT 77* 80* 104*    Basic Metabolic Panel: Recent Labs  Lab 11/30/20 1310 12/01/20 0157 12/01/20 1047  NA 135 137 136  K 3.0* 2.4* 2.3*  CL 99 103 102  CO2 23 23 23   GLUCOSE 117* 86 83  BUN 54* 38* 34*  CREATININE 1.58* 1.15 1.01  CALCIUM 8.4* 8.2* 8.1*  MG 2.4  --   --    GFR: Estimated Creatinine Clearance: 91.4 mL/min (by C-G formula based on SCr of 1.01 mg/dL). Recent Labs  Lab 11/30/20 1310 11/30/20 1643 11/30/20 2237 12/01/20 0030 12/01/20 0147 12/01/20 0157 12/01/20 1047  PROCALCITON 17.28  --   --   --   --  22.89  --   WBC 14.2*  --   --  8.8  --  13.0* 15.6*  LATICACIDVEN 2.0* 2.2* 1.7  --  1.9  --   --     Liver Function Tests: Recent Labs  Lab 11/30/20 1310 12/01/20 0157  AST 102* 97*  ALT 55* 49*  ALKPHOS 166* 129*  BILITOT 1.1  1.5*  PROT 6.5 5.3*  ALBUMIN 2.9* 2.2*   No results for input(s): LIPASE, AMYLASE in the last 168 hours. Recent Labs  Lab 11/30/20 1454  AMMONIA 14    ABG    Component Value Date/Time   HCO3 24.8 11/30/2020 1431   O2SAT 52.8 11/30/2020 1431     Coagulation Profile: Recent Labs  Lab 11/30/20 1310 12/01/20 0147 12/01/20 1047  INR 1.2 1.3* 1.4*    Cardiac Enzymes: No results for input(s): CKTOTAL, CKMB, CKMBINDEX, TROPONINI in the last 168 hours.  HbA1C: Hgb A1c MFr Bld  Date/Time Value Ref Range Status  12/01/2020 01:57 AM 5.8 (H) 4.8 - 5.6 % Final    Comment:    (NOTE) Pre diabetes:          5.7%-6.4%  Diabetes:              >6.4%  Glycemic control for   <7.0% adults with diabetes     CBG: Recent Labs  Lab 12/01/20 1131  GLUCAP 97     Past Medical History:  He,  has no past medical history on file.   Surgical History:  History reviewed. No pertinent  surgical history.   Social History:   reports current alcohol use.   Family History:  His family history is not on file.   Allergies No Known Allergies   Home Medications  Prior to Admission medications   Medication Sig Start Date End Date Taking? Authorizing Provider  Aspirin-Salicylamide-Caffeine (BC HEADACHE POWDER PO) Take by mouth.   Yes [provider]  ibuprofen (ADVIL) 800 MG tablet Take 800 mg by mouth every 8 (eight) hours as needed.   Yes [provider]    Vic Blackbird PA-S [place] [facility]

## 2020-12-01 NOTE — Interval H&P Note (Signed)
History and Physical Interval Note:  12/01/2020 1:04 AM  Caleb Fisher  has presented today for surgery, with the diagnosis of epidural abscess.  The various methods of treatment have been discussed with the patient and family. After consideration of risks, benefits and other options for treatment, the patient has consented to  Procedure(s): POSTERIOR CERVICAL LAMINECTOMY (N/A) as a surgical intervention.  The patient's history has been reviewed, patient examined, no change in status, stable for surgery.  I have reviewed the patient's chart and labs.  Questions were answered to the patient's satisfaction.     Deetta Perla

## 2020-12-01 NOTE — Progress Notes (Signed)
*  PRELIMINARY RESULTS* Echocardiogram 2D Echocardiogram has been performed.  Caleb Fisher 12/01/2020, 2:35 PM

## 2020-12-01 NOTE — Progress Notes (Signed)
PROGRESS NOTE    Caleb Fisher   IWL:[id number]  DOB: 09/29/1975  PCP: Pcp, No    DOA: 11/30/2020 LOS: 1    Brief Narrative / Hospital Course to Date:   45 year old male with no past medical history other than tobacco abuse, presented to the ED from home on 11/30/2020 with altered mental status, increased back pain and new onset neck pain, and frequent falls.  Evaluation in the ED was consistent with severe sepsis from unclear source, with tachycardia and tachypnea.  Subsequently found to have MRSA bacteremia.  LP was attempted but unable to be completed due to patient agitation and unable to lie still.  Further imaging of the spine revealed a cervical epidural abscess involving C2-C5.  Started on empiric broad-spectrum antibiotics.  Labs also notable for profound hypokalemia, thrombocytopenia.  Admitted to [facility] service with infectious disease, neurosurgery, neurology and PCCM consulted.  Transferred to ICU.  Assessment & Plan   Active Problems:   Encephalopathy acute   Severe sepsis -POA as evidenced by tachycardia, tachypnea, fevers, acute encephalopathy consistent with organ dysfunction and severe sepsis. 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  Opiate withdrawal. LP was unable to be obtained after multiple attempts, due to patient agitation and inability to lay still. -- Treat infection and other underlying conditions as outlined --Aspiration precautions --Delirium precautions 11/16: Wife reports patient remains  restless and agitated when awake but mostly lethargic  Thrombocytopenia -likely due to sepsis, DIC is a concern. Has received platelet transfusions. -- Hematology consulted  Acute kidney injury -likely due to dehydration, possibly NSAID use. Renal function improving with IV fluids. --Monitor BMP  Hypokalemia -K is being replaced. --Monitor replace as needed  Hypertension with hypertensive urgency - --Currently on Cardene drip --Per neurosurgery, goal MAP is 70-110 --Monitor BP closely  Transaminitis -with AST elevated, possibly due to shock liver versus alcohol abuse although wife reports patient seldom drinks. -- Follow-up RUQ [place]      Patient BMI: Body mass index is 23.9 kg/m.   DVT prophylaxis: SCD's Start: 12/01/20 1907   Diet:  Diet Orders (From admission, onward)     Start     Ordered   11/30/20 1351  Diet NPO time specified  (Undifferentiated presentation (screening labs and basic nursing orders))  Diet effective now        11/30/20 1351              Code Status: Full Code   Subjective 12/01/20    Patient seen in ICU this morning, wife and best friend are at bedside.  Wife reports she has not left the hospital and notes patient has been persistently agitated whenever he is awake, very restless.  Patient mumbles incoherently during the encounter.  In mittens to prevent him pulling lines or oxygen off.  Face is flushed and he feels very warm.  Becomes briefly restless after repositioning by nursing, seemed to calm down quickly.  Noted to have productive sounding cough   Disposition Plan & Communication   Status is: Inpatient  Remains inpatient appropriate because: Patient is critically ill with MRSA bacteremia and cervical spinal abscess pending washout in  the OR, ongoing evaluation, on IV antibiotics.   Family Communication: Wife and best friend at bedside during encounter today 11/16.   Consults, Procedures, Significant Events   Consultants:   Infectious disease Neurosurgery PCCM Neurology Hematology/oncology  Procedures:  Washout of cervical spinal epidural abscess  Antimicrobials:  Anti-infectives (From admission, onward)    Start     Dose/Rate Route Frequency Ordered Stop   12/01/20 1630  ceftaroline (TEFLARO) 600 mg in sodium chloride 0.9 % 100 mL IVPB        600 mg 100 mL/hr over 60 Minutes Intravenous Every 8 hours 12/01/20 1533     12/01/20 0800  cefTRIAXone (ROCEPHIN) 2 g in sodium chloride 0.9 % 100 mL IVPB  Status:  Discontinued        2 g 200 mL/hr over 30 Minutes Intravenous Every 24 hours 11/30/20 2311 12/01/20 0032   12/01/20 0700  vancomycin (VANCOREADY) IVPB 1000 mg/200 mL        1,000 mg 200 mL/hr over 60 Minutes Intravenous Every 12 hours 11/30/20 2319     12/01/20 0700  ceFEPIme (MAXIPIME) 2 g in sodium chloride 0.9 % 100 mL IVPB  Status:  Discontinued        2 g 200 mL/hr over 30 Minutes Intravenous Every 8 hours 12/01/20 0054 12/01/20 0128   12/01/20 0618  metroNIDAZOLE (FLAGYL) IVPB 500 mg  Status:  Discontinued        500 mg 100 mL/hr over 60 Minutes Intravenous Every 6 hours 12/01/20 0031 12/01/20 1237   12/01/20 0600  cefTRIAXone (ROCEPHIN) 2 g in sodium chloride 0.9 % 100 mL IVPB  Status:  Discontinued        2 g 200 mL/hr over 30 Minutes Intravenous Every 12 hours 12/01/20 0035 12/01/20 0049   12/01/20 0045  cefTRIAXone (ROCEPHIN) 2 g in sodium chloride 0.9 % 100 mL IVPB  Status:  Discontinued        2 g 200 mL/hr over 30 Minutes Intravenous Every 12 hours 12/01/20 0032 12/01/20 0035   12/01/20 0000  metroNIDAZOLE (FLAGYL) IVPB 500 mg  Status:  Discontinued        500 mg 100 mL/hr over 60 Minutes Intravenous Every 12 hours 11/30/20 2311 12/01/20 0031   11/30/20 2300  ceFEPIme (MAXIPIME) 2 g in sodium chloride 0.9 % 100 mL IVPB  Status:  Discontinued        2 g 200 mL/hr over 30 Minutes Intravenous Every 8 hours 11/30/20 1816 12/01/20 0000   11/30/20 1930  acyclovir (ZOVIRAX) 725 mg in  dextrose 5 % 150 mL IVPB  Status:  Discontinued        10 mg/kg  72.6 kg 164.5 mL/hr over 60 Minutes Intravenous Every 8 hours 11/30/20 1915 12/01/20 0032   11/30/20 1500  vancomycin (VANCOREADY) IVPB 1750 mg/350 mL        1,750 mg 175 mL/hr over 120 Minutes Intravenous  Once 11/30/20 1443 11/30/20 2200   11/30/20 1445  ceFEPIme (MAXIPIME) 2 g in sodium chloride 0.9 % 100 mL IVPB        2 g 200 mL/hr over 30 Minutes Intravenous  Once 11/30/20 1436 11/30/20 1728   11/30/20 1445  metroNIDAZOLE (FLAGYL) IVPB 500 mg        500 mg 100 mL/hr over 60 Minutes Intravenous  Once 11/30/20 1436 11/30/20 1835   11/30/20 1445  vancomycin (VANCOCIN) IVPB 1000 mg/200 mL premix  Status:  Discontinued        1,000 mg 200 mL/hr  over 60 Minutes Intravenous  Once 11/30/20 1436 11/30/20 1443         Micro    Objective   Vitals:   12/01/20 1619 12/01/20 1700 12/01/20 1851 12/01/20 1854  BP: (!) 140/91 135/89  118/69  Pulse: (!) 114 (!) 114    Resp: (!) 28 (!) 26    Temp: 99.1 F (37.3 C)     TempSrc: Oral     SpO2: (!) 89% 93% 96% 97%  Weight:      Height:        Intake/Output Summary (Last 24 hours) at 12/01/2020 1943 Last data filed at 12/01/2020 1831 Gross per 24 hour  Intake 2791.5 ml  Output 4300 ml  Net -1508.5 ml   Filed Weights   11/30/20 1311 11/30/20 2216  Weight: 72.6 kg 73.4 kg    Physical Exam:  General exam: Lethargic, restless and agitated when awake Respiratory system: Bilateral rhonchi, mildly increased respiratory effort, on 2 L/min nasal cannula oxygen. Cardiovascular system: normal S1/S2, tachycardic, regular rhythm, no pedal edema.   Gastrointestinal system: soft, nontender abdomen. Central nervous system: Unable to evaluate as patient not able to follow commands, encephalopathic -mostly lethargic but restless and agitated when awake Extremities: Mittens on bilateral hands, no edema, normal tone Skin: dry, intact, warm to touch, face is flushed Psychiatry:  Patient is encephalopathic, unable to assess  Labs   Data Reviewed: I have personally reviewed following labs and imaging studies  CBC: Recent Labs  Lab 11/30/20 1310 12/01/20 0030 12/01/20 0147 12/01/20 0157 12/01/20 1047  WBC 14.2* 8.8  --  13.0* 15.6*  NEUTROABS 12.8*  --   --   --  14.0*  HGB 12.8* 9.8*  --  11.3* 11.0*  HCT 36.6* 28.1*  --  32.6* 31.8*  MCV 85.1 85.9  --  83.8 84.4  PLT 125* NO PLT 77* 80* 381*   Basic Metabolic Panel: Recent Labs  Lab 11/30/20 1310 12/01/20 0157 12/01/20 1047  NA 135 137 136  K 3.0* 2.4* 2.3*  CL 99 103 102  CO2 23 23 23   GLUCOSE 117* 86 83  BUN 54* 38* 34*  CREATININE 1.58* 1.15 1.01  CALCIUM 8.4* 8.2* 8.1*  MG 2.4  --   --    GFR: Estimated Creatinine Clearance: 91.4 mL/min (by C-G formula based on SCr of 1.01 mg/dL). Liver Function Tests: Recent Labs  Lab 11/30/20 1310 12/01/20 0157  AST 102* 97*  ALT 55* 49*  ALKPHOS 166* 129*  BILITOT 1.1 1.5*  PROT 6.5 5.3*  ALBUMIN 2.9* 2.2*   No results for input(s): LIPASE, AMYLASE in the last 168 hours. Recent Labs  Lab 11/30/20 1454  AMMONIA 14   Coagulation Profile: Recent Labs  Lab 11/30/20 1310 12/01/20 0147 12/01/20 1047  INR 1.2 1.3* 1.4*   Cardiac Enzymes: No results for input(s): CKTOTAL, CKMB, CKMBINDEX, TROPONINI in the last 168 hours. BNP (last 3 results) No results for input(s): PROBNP in the last 8760 hours. HbA1C: Recent Labs    12/01/20 0157  HGBA1C 5.8*   CBG: Recent Labs  Lab 12/01/20 1131  GLUCAP 97   Lipid Profile: Recent Labs    12/01/20 0157  CHOL 75  HDL <10*  LDLCALC NOT CALCULATED  TRIG 147  CHOLHDL NOT CALCULATED   Thyroid Function Tests: Recent Labs    11/30/20 1310 12/01/20 0157  TSH 0.267*  --   FREET4  --  1.31*   Anemia Panel: No results for input(s): VITAMINB12, FOLATE, FERRITIN, TIBC,  IRON, RETICCTPCT in the last 72 hours. Sepsis Labs: Recent Labs  Lab 11/30/20 1310 11/30/20 1643 11/30/20 2237  12/01/20 0147 12/01/20 0157  PROCALCITON 17.28  --   --   --  22.89  LATICACIDVEN 2.0* 2.2* 1.7 1.9  --     Recent Results (from the past 240 hour(s))  Blood Culture (routine x 2)     Status: None (Preliminary result)   Collection Time: 11/30/20  2:54 PM   Specimen: BLOOD  Result Value Ref Range Status   Specimen Description   Final    BLOOD LEFT ANTECUBITAL Performed at [facility], [address] [place], [place] [address]    Special Requests   Final    BOTTLES DRAWN AEROBIC AND ANAEROBIC Blood Culture results may not be optimal due to an excessive volume of blood received in culture bottles Performed at [facility], [address] [place], [place] [address]    Culture  Setup Time   Final    Organism ID to follow [place] CRITICAL RESULT CALLED TO, READ BACK BY AND VERIFIED WITH: NATHAN BELUE @0011  ON 12/01/20 SKL    Culture GRAM POSITIVE COCCI  Final   Report Status PENDING  Incomplete  Blood Culture (routine x 2)     Status: None (Preliminary result)   Collection Time: 11/30/20  2:54 PM   Specimen: BLOOD  Result Value Ref Range Status   Specimen Description   Final    BLOOD BLOOD RIGHT HAND Performed at [facility], [address], [place], [place] [address]    Special Requests   Final    BOTTLES DRAWN AEROBIC AND ANAEROBIC Blood Culture adequate volume Performed at [facility], [address] [place],  [address]    Culture  Setup Time   Final    GRAM POSITIVE COCCI IN BOTH AEROBIC AND ANAEROBIC BOTTLES CRITICAL VALUE NOTED.  VALUE IS CONSISTENT WITH PREVIOUSLY REPORTED AND CALLED VALUE.    Culture GRAM POSITIVE COCCI  Final   Report Status PENDING  Incomplete  Resp Panel by RT-PCR (Flu A&B, Covid) Nasopharyngeal Swab     Status: None   Collection Time: 11/30/20  2:54 PM   Specimen: Nasopharyngeal Swab; Nasopharyngeal(NP) swabs in vial transport medium  Result  Value Ref Range Status   SARS Coronavirus 2 by RT PCR NEGATIVE NEGATIVE Final    Comment: (NOTE) SARS-CoV-2 target nucleic acids are NOT DETECTED.  The SARS-CoV-2 RNA is generally detectable in upper respiratory specimens during the acute phase of infection. The lowest concentration of SARS-CoV-2 viral copies this assay can detect is 138 copies/mL. A negative result does not preclude SARS-Cov-2 infection and should not be used as the sole basis for treatment or other patient management decisions. A negative result may occur with  improper specimen collection/handling, submission of specimen other than nasopharyngeal swab, presence of viral mutation(s) within the areas targeted by this assay, and inadequate number of viral copies(<138 copies/mL). A negative result must be combined with clinical observations, patient history, and epidemiological information. The expected result is Negative.  Fact Sheet for Patients:  EntrepreneurPulse.com.au  Fact Sheet for Healthcare Providers:  IncredibleEmployment.be  This test is no t yet approved or cleared by the [place] FDA and  has been authorized for detection and/or diagnosis of SARS-CoV-2 by FDA under an Emergency Use Authorization (EUA). This EUA will remain  in effect (meaning this test can be used) for the duration of the COVID-19  declaration under Section 564(b)(1) of the Act, 21 U.S.C.section 360bbb-3(b)(1), unless the authorization is terminated  or revoked sooner.       Influenza A by PCR NEGATIVE NEGATIVE Final   Influenza B by PCR NEGATIVE NEGATIVE Final    Comment: (NOTE) The Xpert Xpress SARS-CoV-2/FLU/RSV plus assay is intended as an aid in the diagnosis of influenza from Nasopharyngeal swab specimens and should not be used as a sole basis for treatment. Nasal washings and aspirates are unacceptable for Xpert Xpress SARS-CoV-2/FLU/RSV testing.  Fact Sheet for  Patients: EntrepreneurPulse.com.au  Fact Sheet for Healthcare Providers: IncredibleEmployment.be  This test is not yet approved or cleared by the [place] FDA and has been authorized for detection and/or diagnosis of SARS-CoV-2 by FDA under an Emergency Use Authorization (EUA). This EUA will remain in effect (meaning this test can be used) for the duration of the COVID-19 declaration under Section 564(b)(1) of the Act, 21 U.S.C. section 360bbb-3(b)(1), unless the authorization is terminated or revoked.  Performed at [facility], [address] [place], [place] [address]   Blood Culture ID Panel (Reflexed)     Status: Abnormal   Collection Time: 11/30/20  2:54 PM  Result Value Ref Range Status   Enterococcus faecalis NOT DETECTED NOT DETECTED Final   Enterococcus Faecium NOT DETECTED NOT DETECTED Final   Listeria monocytogenes NOT DETECTED NOT DETECTED Final   Staphylococcus species DETECTED (A) NOT DETECTED Final    Comment: CRITICAL RESULT CALLED TO, READ BACK BY AND VERIFIED WITH: NATHAN BELUE @0011  ON 12/01/20 SKL    Staphylococcus aureus (BCID) DETECTED (A) NOT DETECTED Final    Comment: Methicillin (oxacillin)-resistant Staphylococcus aureus (MRSA). MRSA is predictably resistant to beta-lactam antibiotics (except ceftaroline). Preferred therapy is vancomycin unless clinically contraindicated. Patient requires contact precautions if  hospitalized. CRITICAL RESULT CALLED TO, READ BACK BY AND VERIFIED WITH: NATHAN BELUE @0011  ON 12/01/20 SKL    Staphylococcus epidermidis NOT DETECTED NOT DETECTED Final   Staphylococcus lugdunensis NOT DETECTED NOT DETECTED Final   Streptococcus species NOT DETECTED NOT DETECTED Final   Streptococcus agalactiae NOT DETECTED NOT DETECTED Final   Streptococcus pneumoniae NOT DETECTED NOT DETECTED Final   Streptococcus pyogenes NOT DETECTED NOT DETECTED Final   A.calcoaceticus-baumannii NOT  DETECTED NOT DETECTED Final   Bacteroides fragilis NOT DETECTED NOT DETECTED Final   Enterobacterales NOT DETECTED NOT DETECTED Final   Enterobacter cloacae complex NOT DETECTED NOT DETECTED Final   Escherichia coli NOT DETECTED NOT DETECTED Final   Klebsiella aerogenes NOT DETECTED NOT DETECTED Final   Klebsiella oxytoca NOT DETECTED NOT DETECTED Final   Klebsiella pneumoniae NOT DETECTED NOT DETECTED Final   Proteus species NOT DETECTED NOT DETECTED Final   Salmonella species NOT DETECTED NOT DETECTED Final   Serratia marcescens NOT DETECTED NOT DETECTED Final   Haemophilus influenzae NOT DETECTED NOT DETECTED Final   Neisseria meningitidis NOT DETECTED NOT DETECTED Final   Pseudomonas aeruginosa NOT DETECTED NOT DETECTED Final   Stenotrophomonas maltophilia NOT DETECTED NOT DETECTED Final   Candida albicans NOT DETECTED NOT DETECTED Final   Candida auris NOT DETECTED NOT DETECTED Final   Candida glabrata NOT DETECTED NOT DETECTED Final   Candida krusei NOT DETECTED NOT DETECTED Final   Candida parapsilosis NOT DETECTED NOT DETECTED Final   Candida tropicalis NOT DETECTED NOT DETECTED Final   Cryptococcus neoformans/gattii NOT DETECTED NOT DETECTED Final   Meth resistant mecA/C and MREJ DETECTED (A) NOT DETECTED Final    Comment: CRITICAL RESULT CALLED TO, READ BACK  BY AND VERIFIED WITH: NATHAN BELUE @0011  ON 12/01/20 SKL Performed at [facility], [address] [place], [place] [address]   MRSA Next Gen by PCR, Nasal     Status: Abnormal   Collection Time: 12/01/20  8:40 AM   Specimen: Nasal Mucosa; Nasal Swab  Result Value Ref Range Status   MRSA by PCR Next Gen DETECTED (A) NOT DETECTED Final    Comment: RESULT CALLED TO, READ BACK BY AND VERIFIED WITH: CHARLIE FLEETWOOD 12/01/2020 1447 JGF Performed at [facility], [address] [place], [place] [address]       Imaging Studies   DG Cervical Spine 2-3 Views  Result Date: 12/01/2020 CLINICAL  DATA:  Epidural abscess EXAM: CERVICAL SPINE - 2-3 VIEW; DG C-ARM 1-60 MIN COMPARISON:  MRI cervical spine 11/30/2020 FINDINGS: AP and lateral C-arm images were obtained of the cervical spine. The lateral view, there are surgical instruments posteriorly at C2-3. Instruments are posterior to the spinous processes of C2 and C3. No skeletal abnormality. IMPRESSION: Surgical localization C2-3 posteriorly. Electronically Signed   By: Franchot Gallo M.D.   On: 12/01/2020 18:10   DG Lumbar Spine 2-3 Views  Result Date: 11/30/2020 CLINICAL DATA:  Back pain EXAM: LUMBAR SPINE - 2-3 VIEW COMPARISON:  None. FINDINGS: Normal alignment. The vertebral body heights are well preserved. Multilevel ventral endplate spurring and disc space narrowing is identified. This is most severe at the L5-S1 level. Mild facet degenerative change identified at L3-4, L4-5 and L5-S1. IMPRESSION: 1. No acute findings. 2. Degenerative disc disease and facet arthropathy. Electronically Signed   By: Kerby Moors M.D.   On: 11/30/2020 14:24   CT Head Wo Contrast  Result Date: 11/30/2020 CLINICAL DATA:  Altered mental status EXAM: CT HEAD WITHOUT CONTRAST TECHNIQUE: Contiguous axial images were obtained from the base of the skull through the vertex without intravenous contrast. COMPARISON:  None. FINDINGS: Brain: Ventricles are not dilated. There is no shift of midline structures. Cortical sulci are prominent. There is 2 x 1 cm low-density in the left parieto-occipital cortex immediately above the tentorium, possibly suggesting encephalomalacia from previous infarction. There is no focal edema or mass effect. There are no signs of bleeding within the cranium. Vascular: There are scattered arterial calcifications. Skull: Unremarkable. Sinuses/Orbits: Unremarkable. Other: None IMPRESSION: There are no signs of bleeding within the cranium. Ventricles are not dilated. There is no focal mass effect. There is 2 x 1 cm area of low-density in the left  parieto-occipital cortex, possibly suggesting encephalomalacia from previous infarction. Follow-up MRI should be considered for further characterization. Electronically Signed   By: Elmer Picker M.D.   On: 11/30/2020 13:51   CT Cervical Spine Wo Contrast  Result Date: 11/30/2020 CLINICAL DATA:  Intermittent, severe neck pain, frequent falls, fall 3 days ago EXAM: CT CERVICAL SPINE WITHOUT CONTRAST TECHNIQUE: Multidetector CT imaging of the cervical spine was performed without intravenous contrast. Multiplanar CT image reconstructions were also generated. COMPARISON:  None. FINDINGS: Alignment: Normal. Skull base and vertebrae: No acute fracture. No primary bone lesion or focal pathologic process. Soft tissues and spinal canal: No prevertebral fluid or swelling. No visible canal hematoma. Disc levels: Focally moderate disc space height loss and osteophytosis at C5-C6, with otherwise preserved disc spaces. Small, broad-based posterior disc bulge at C5-C6 (series 6, image 27, series 3, image 61). Upper chest: Paraseptal emphysema. Partially cavitary nodule of the included left pulmonary apex measuring 0.7 x 0.7 cm (series 2, image 97). Other: None. IMPRESSION: 1.  No fracture or static subluxation of the cervical spine. 2. Focally moderate disc space height loss and osteophytosis at C5-C6, with otherwise preserved disc spaces. Small, broad-based posterior disc bulge at C5-C6. Cervical disc, neural foraminal, and spinal cord pathology may be further evaluated by MRI if indicated by neurologically localizing signs and symptoms. 3. Partially cavitary nodule of the included left pulmonary apex measuring 0.7 x 0.7 cm. This is generally nonspecific and statistically likely to be infectious or inflammatory, however general differential considerations would include septic embolus and metastatic disease. Consider additional dedicated imaging of the chest at this time if warranted by acute clinical presentation.  Otherwise recommend follow-up CT examination of the chest on a nonemergent basis to evaluate for this and other pulmonary nodules. Emphysema (ICD10-J43.9). Electronically Signed   By: Delanna Ahmadi M.D.   On: 11/30/2020 14:27   MR BRAIN W WO CONTRAST  Addendum Date: 11/30/2020   ADDENDUM REPORT: 11/30/2020 22:08 ADDENDUM: These results were called by telephone at the time of interpretation on 11/30/2020 at 10:08 pm to provider [facility] , who verbally acknowledged these results. Electronically Signed   By: Ulyses Jarred M.D.   On: 11/30/2020 22:08   Result Date: 11/30/2020 CLINICAL DATA:  Neck pain and altered mental status EXAM: MRI HEAD WITHOUT AND WITH CONTRAST MRI CERVICAL SPINE WITHOUT AND WITH CONTRAST TECHNIQUE: Multiplanar, multiecho pulse sequences of the brain and surrounding structures, and cervical spine, to include the craniocervical junction and cervicothoracic junction, were obtained without and with intravenous contrast. CONTRAST:  7 mL Gadavist COMPARISON:  None. FINDINGS: MRI HEAD FINDINGS Brain: No acute infarct, mass effect or extra-axial collection. No acute or chronic hemorrhage. There is multifocal hyperintense T2-weighted signal within the white matter. Parenchymal volume and CSF spaces are normal. 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 The midline structures are normal. Vascular: Major flow voids are preserved. Skull and upper cervical spine: Normal calvarium and skull base. Visualized upper cervical spine and soft tissues are normal. Sinuses/Orbits:No paranasal sinus fluid levels or advanced mucosal thickening. No mastoid or middle ear effusion. Normal orbits. MRI CERVICAL SPINE FINDINGS Alignment: Physiologic. Vertebrae: No fracture, evidence of discitis, or bone lesion. Cord: Spinal cord itself is normal, but there is a circumferential collection the length the cervical spine with peripheral contrast enhancement. The thickness of the collection is greatest at the C2 and C3  levels, measuring up to 6 mm in the 7 o'clock position of the spinal canal. Posterior Fossa, vertebral arteries, paraspinal tissues: Small prevertebral effusion from C2-C5, measuring 7 mm in thickness. Disc levels: There is mild multilevel degenerative disc disease, greatest at C5-6. There is no bony spinal canal stenosis. The CSF space is effaced along the entire length of the cervical spine due to the above described epidural collection. IMPRESSION: 1. 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 2. No spinal cord signal change. 3. Small prevertebral effusion from C2-C5. 4. No acute intracranial abnormality. 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 Electronically Signed: By: Ulyses Jarred M.D. On: 11/30/2020 21:51   MR CERVICAL SPINE W WO CONTRAST  Addendum Date: 11/30/2020   ADDENDUM REPORT: 11/30/2020 22:08 ADDENDUM: These results were called by telephone at the time of interpretation on 11/30/2020 at 10:08 pm to provider [facility] , who verbally acknowledged these results. Electronically Signed   By: Cletus Gash.D.  On: 11/30/2020 22:08   Result Date: 11/30/2020 CLINICAL DATA:  Neck pain and altered mental status EXAM: MRI HEAD WITHOUT AND WITH CONTRAST MRI CERVICAL SPINE WITHOUT AND WITH CONTRAST TECHNIQUE: Multiplanar, multiecho pulse sequences of the brain and surrounding structures, and cervical spine, to include the craniocervical junction and cervicothoracic junction, were obtained without and with intravenous contrast. CONTRAST:  7 mL Gadavist COMPARISON:  None. FINDINGS: MRI HEAD FINDINGS Brain: No acute infarct, mass effect or extra-axial collection. No acute or chronic hemorrhage. There is multifocal hyperintense T2-weighted signal within the white matter. Parenchymal volume and CSF spaces are normal. 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 The midline structures are normal. Vascular: Major flow voids are preserved. Skull and upper cervical spine: Normal calvarium and skull base. Visualized upper cervical spine and soft tissues are normal. Sinuses/Orbits:No paranasal sinus fluid levels or advanced mucosal thickening. No mastoid or middle ear effusion. Normal orbits. MRI CERVICAL SPINE FINDINGS Alignment: Physiologic. Vertebrae: No fracture, evidence of discitis, or bone lesion. Cord: Spinal cord itself is normal, but there is a circumferential collection the length the cervical spine with peripheral contrast enhancement. The thickness of the collection is greatest at the C2 and C3 levels, measuring up to 6 mm in the 7 o'clock position of the spinal canal. Posterior Fossa, vertebral arteries, paraspinal tissues: Small prevertebral effusion from C2-C5, measuring 7 mm in thickness. Disc levels: There is mild multilevel degenerative disc disease, greatest at C5-6. There is no bony spinal canal stenosis. The CSF space is effaced along the entire length of the cervical spine due to the above described epidural collection. IMPRESSION: 1. 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 2. No spinal cord signal change. 3. Small prevertebral effusion from C2-C5. 4. No acute intracranial abnormality. 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 Electronically Signed: By: Ulyses Jarred M.D. On: 11/30/2020 21:51   CT CHEST ABDOMEN PELVIS W CONTRAST  Result Date: 11/30/2020 CLINICAL DATA:  Multiple falls. EXAM: CT CHEST, ABDOMEN, AND PELVIS WITH CONTRAST TECHNIQUE: Multidetector CT imaging of the chest, abdomen and pelvis was performed following the standard protocol during bolus administration of intravenous contrast. CONTRAST:  123mL OMNIPAQUE IOHEXOL 300 MG/ML  SOLN COMPARISON:   None. FINDINGS: CT CHEST FINDINGS Cardiovascular: There is mild calcification of the aortic arch. The ascending thoracic aorta measures 4.4 cm x 4.3 cm. Normal heart size. No pericardial effusion. Mediastinum/Nodes: A 2.6 cm x 2.6 cm anterolateral right paratracheal lymph node is seen. Mild right hilar lymphadenopathy is also noted. Thyroid gland, trachea, and esophagus demonstrate no significant findings. Lungs/Pleura: Subcentimeter noncalcified lung nodules versus focal scars are seen along the posterolateral aspect of the left apex (axial CT images 7 through 11, CT series 4). A 9 mm noncalcified lung nodule is seen within the lateral aspect of the left lower lobe (axial CT image 34, CT series 4). A 1.1 cm noncalcified lung nodule is seen within the posteromedial aspect of the right lung base (axial CT image 43, CT series 4). An 8 mm noncalcified lung nodule is seen within the lateral aspect of the right lung base (axial CT image 38, CT series 4). A 5 mm anterior left basilar noncalcified lung nodule is present (axial CT image 38, CT series 4). There is no evidence of an acute infiltrate, pleural effusion or pneumothorax. Musculoskeletal: No chest wall mass or suspicious  bone lesions identified. CT ABDOMEN PELVIS FINDINGS Hepatobiliary: No focal liver abnormality is seen. No gallstones, gallbladder wall thickening, or biliary dilatation. Pancreas: Unremarkable. No pancreatic ductal dilatation or surrounding inflammatory changes. Spleen: Normal in size without focal abnormality. Adrenals/Urinary Tract: Adrenal glands are unremarkable. Kidneys are normal in size, without obstructing renal calculi or focal lesion. A 1.2 cm x 1.1 cm nonobstructing renal calculus is seen within the mid right kidney. Mild right-sided hydronephrosis is seen. No ureteral calculi are identified. Mild Peri ureteral inflammatory fat stranding is seen. This extends along the anterior aspect of the right psoas muscle. Bladder is unremarkable.  Stomach/Bowel: Stomach is within normal limits. Appendix appears normal. No evidence of bowel wall thickening, distention, or inflammatory changes. Vascular/Lymphatic: Aortic atherosclerosis. No enlarged abdominal or pelvic lymph nodes. Reproductive: Prostate is unremarkable. Other: No abdominal wall hernia or abnormality. No abdominopelvic ascites. Musculoskeletal: No acute osseous abnormalities are identified. Marked severity degenerative changes are seen at the level of L5-S1. IMPRESSION: 1. Mild right-sided periureteral inflammatory fat stranding which may represent sequelae associated with acute pyelonephritis versus recently passed renal calculus. Correlation with urinalysis is recommended. 2. Multiple noncalcified lung nodules within both lungs, as described above. Non-contrast chest CT at 3-6 months is recommended. If the nodules are stable at time of repeat CT, then future CT at 18-24 months (from today's scan) is considered optional for low-risk patients, but is recommended for high-risk patients. This recommendation follows the consensus statement: Guidelines for Management of Incidental Pulmonary Nodules Detected on CT Images: From the Fleischner Society 2017; Radiology 2017; 284:228-243. 3. Ascending thoracic aortic aneurysm, measuring 4.4 cm x 4.3 cm. 4. 1.2 cm x 1.1 cm nonobstructing renal calculus within the mid right kidney. 5. Marked severity degenerative changes at the level of L5-S1. 6. Aortic atherosclerosis. Aortic Atherosclerosis (ICD10-I70.0). Electronically Signed   By: Virgina Norfolk M.D.   On: 11/30/2020 16:25   CT T-SPINE NO CHARGE  Result Date: 11/30/2020 CLINICAL DATA:  Altered mental status.  Back pain. EXAM: CT THORACIC SPINE WITHOUT CONTRAST TECHNIQUE: Multidetector CT images of the thoracic were obtained using the standard protocol without intravenous contrast. COMPARISON:  CT chest 11/30/2020 FINDINGS: Alignment: Normal Vertebrae: No thoracic region fracture or focal bone  lesion. Paraspinal and other soft tissues: Negative Disc levels: No significant thoracic region degenerative change. No apparent stenosis of the canal or foramina. No significant facet arthritis. IMPRESSION: No acute or traumatic finding. No significant abnormality in the thoracic region. Electronically Signed   By: Nelson Chimes M.D.   On: 11/30/2020 16:14   CT L-SPINE NO CHARGE  Result Date: 11/30/2020 CLINICAL DATA:  Neck and back pain.  Lethargic.  Multiple falls. EXAM: CT LUMBAR SPINE WITHOUT CONTRAST TECHNIQUE: Multidetector CT imaging of the lumbar spine was performed without intravenous contrast administration. Multiplanar CT image reconstructions were also generated. COMPARISON:  Radiography 11/30/2020 FINDINGS: Segmentation: 5 lumbar type vertebral bodies. Alignment: No malalignment. Vertebrae: No evidence of fracture. Chronic discogenic sclerotic change of the endplates at D6-U4. Paraspinal and other soft tissues: Negative Disc levels: No significant disc level finding at L3-4 or above. L4-5: Circumferential protrusion of the disc. Facet and ligamentous hypertrophy. Multifactorial stenosis at this level that could cause neural compression. L5-S1: Chronic disc degeneration with sclerotic bone changes as noted above. Shallow protrusion of the disc. Stenosis of the subarticular lateral recesses that could cause neural compression. IMPRESSION: No acute lumbar region finding. 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  Electronically Signed   By: Nelson Chimes M.D.   On: 11/30/2020 16:13   DG Chest Port 1 View  Result Date: 12/01/2020 CLINICAL DATA:  Acute respiratory failure with hypoxia EXAM: PORTABLE CHEST 1 VIEW COMPARISON:  November 30, 2020. FINDINGS: Stable cardiomediastinal silhouette. Mild central pulmonary vascular congestion is noted. Increased bilateral lung opacities are noted concerning for edema or pneumonia. Bony  thorax is unremarkable. IMPRESSION: Mild central pulmonary vascular congestion. Increased bilateral lung opacities are noted concerning for edema or pneumonia. Electronically Signed   By: Marijo Conception M.D.   On: 12/01/2020 09:09   DG Chest Port 1 View  Result Date: 11/30/2020 CLINICAL DATA:  Questionable sepsis.  Evaluate for abnormality. EXAM: PORTABLE CHEST 1 VIEW COMPARISON:  None. FINDINGS: Lungs are clear. Heart and mediastinum are within normal limits. Trachea is midline. No acute bone abnormality. Negative for a pneumothorax. IMPRESSION: No active disease. Electronically Signed   By: Markus Daft M.D.   On: 11/30/2020 14:19   DG C-Arm 1-60 Min  Result Date: 12/01/2020 CLINICAL DATA:  Epidural abscess EXAM: CERVICAL SPINE - 2-3 VIEW; DG C-ARM 1-60 MIN COMPARISON:  MRI cervical spine 11/30/2020 FINDINGS: AP and lateral C-arm images were obtained of the cervical spine. The lateral view, there are surgical instruments posteriorly at C2-3. Instruments are posterior to the spinous processes of C2 and C3. No skeletal abnormality. IMPRESSION: Surgical localization C2-3 posteriorly. Electronically Signed   By: Franchot Gallo M.D.   On: 12/01/2020 18:10   ECHOCARDIOGRAM COMPLETE  Result Date: 12/01/2020    ECHOCARDIOGRAM REPORT   Patient Name:   JAMEAR CARBONNEAU Date of Exam: 12/01/2020 Medical Rec #:  [id number]   Height:       69.0 in Accession #:    [id number]  Weight:       161.8 lb Date of Birth:  November 02, 1975   BSA:          1.888 m Patient Age:    43 years    BP:           139/97 mmHg Patient Gender: M           HR:           112 bpm. Exam Location:  [facility] Procedure: 2D Echo, Cardiac Doppler and Color Doppler Indications:     Bacteremia R78.81  History:         Patient has no prior history of Echocardiogram examinations. No                  past medical history on file.  Sonographer:     [name] Referring Phys:  [id number] Tsosie Billing Diagnosing Phys: Kate Sable MD  Sonographer Comments:  Suboptimal apical window. IMPRESSIONS  1. Left ventricular ejection fraction, by estimation, is 70 to 75%. The left ventricle has hyperdynamic function. The left ventricle has no regional wall motion abnormalities. Left ventricular diastolic parameters are consistent with Grade I diastolic dysfunction (impaired relaxation).  2. Right ventricular systolic function is normal. The right ventricular size is normal.  3. The mitral valve is normal in structure. No evidence of mitral valve regurgitation.  4. The aortic valve is grossly normal. Aortic valve regurgitation is not visualized.  5. Aortic dilatation noted. There is mild dilatation of the aortic root, measuring 41 mm. Conclusion(s)/Recommendation(s): No evidence of valvular vegetations on this transthoracic echocardiogram. Consider a transesophageal echocardiogram to exclude infective endocarditis if clinically indicated. FINDINGS  Left Ventricle: Left ventricular ejection fraction, by estimation, is 70  to 75%. The left ventricle has hyperdynamic function. The left ventricle has no regional wall motion abnormalities. The left ventricular internal cavity size was normal in size. There is no left ventricular hypertrophy. Left ventricular diastolic parameters are consistent with Grade I diastolic dysfunction (impaired relaxation). Right Ventricle: The right ventricular size is normal. No increase in right ventricular wall thickness. Right ventricular systolic function is normal. Left Atrium: Left atrial size was normal in size. Right Atrium: Right atrial size was normal in size. Pericardium: There is no evidence of pericardial effusion. Mitral Valve: The mitral valve is normal in structure. No evidence of mitral valve regurgitation. Tricuspid Valve: The tricuspid valve is normal in structure. Tricuspid valve regurgitation is not demonstrated. Aortic Valve: The aortic valve is grossly normal. Aortic valve regurgitation is not visualized. Aortic valve mean gradient  measures 8.0 mmHg. Aortic valve peak gradient measures 13.7 mmHg. Aortic valve area, by VTI measures 4.02 cm. Pulmonic Valve: The pulmonic valve was not well visualized. Pulmonic valve regurgitation is not visualized. Aorta: Aortic dilatation noted. There is mild dilatation of the aortic root, measuring 41 mm. Venous: The inferior vena cava was not well visualized. IAS/Shunts: No atrial level shunt detected by color flow Doppler.  LEFT VENTRICLE PLAX 2D LVIDd:         4.41 cm   Diastology LVIDs:         2.81 cm   LV e' medial:    7.29 cm/s LV PW:         1.16 cm   LV E/e' medial:  10.8 LV IVS:        1.27 cm   LV e' lateral:   7.83 cm/s LVOT diam:     2.30 cm   LV E/e' lateral: 10.0 LV SV:         96 LV SV Index:   51 LVOT Area:     4.15 cm  RIGHT VENTRICLE RV S prime:     19.70 cm/s LEFT ATRIUM             Index        RIGHT ATRIUM           Index LA diam:        3.40 cm 1.80 cm/m   RA Area:     20.40 cm LA Vol (A2C):   52.1 ml 27.59 ml/m  RA Volume:   64.80 ml  34.32 ml/m LA Vol (A4C):   57.3 ml 30.35 ml/m LA Biplane Vol: 55.3 ml 29.29 ml/m  AORTIC VALVE                     PULMONIC VALVE AV Area (Vmax):    3.77 cm      PV Vmax:        1.52 m/s AV Area (Vmean):   3.59 cm      PV Vmean:       106.000 cm/s AV Area (VTI):     4.02 cm      PV VTI:         0.236 m AV Vmax:           185.00 cm/s   PV Peak grad:   9.2 mmHg AV Vmean:          125.000 cm/s  PV Mean grad:   5.0 mmHg AV VTI:            0.240 m       RVOT Peak grad: 10 mmHg AV  Peak Grad:      13.7 mmHg AV Mean Grad:      8.0 mmHg LVOT Vmax:         168.00 cm/s LVOT Vmean:        108.000 cm/s LVOT VTI:          0.232 m LVOT/AV VTI ratio: 0.97  AORTA Ao Root diam: 3.83 cm MITRAL VALVE                TRICUSPID VALVE MV Area (PHT): 4.80 cm     TR Peak grad:   14.3 mmHg MV Decel Time: 158 msec     TR Vmax:        189.00 cm/s MV E velocity: 78.40 cm/s MV A velocity: 110.00 cm/s  SHUNTS MV E/A ratio:  0.71         Systemic VTI:  0.23 m                              Systemic Diam: 2.30 cm                             Pulmonic VTI:  0.183 m Kate Sable MD Electronically signed by Kate Sable MD Signature Date/Time: 12/01/2020/3:53:46 PM    Final    US Abdomen Limited RUQ (LIVER/GB)  Result Date: 12/01/2020 CLINICAL DATA:  Abnormal LFTs EXAM: ULTRASOUND ABDOMEN LIMITED RIGHT UPPER QUADRANT COMPARISON:  CT from 11/30/2020 FINDINGS: Gallbladder: Gallbladder is well distended with evidence of gallbladder sludge. No definitive cholelithiasis is seen. No wall thickening or pericholecystic fluid is noted. Negative sonographic Murphy's sign is elicited. Common bile duct: Diameter: 5.3 mm. Liver: No focal lesion identified. Within normal limits in parenchymal echogenicity. Portal vein is patent on color Doppler imaging with normal direction of blood flow towards the liver. Other: Note is made of known right renal stones without obstructing change. IMPRESSION: Gallbladder sludge without evidence cholelithiasis. Nonobstructing right renal calculi similar to that seen on prior CT. Electronically Signed   By: Inez Catalina M.D.   On: 12/01/2020 15:29   DG Lumbar Puncture Fluoro Guide  Result Date: 11/30/2020 CLINICAL DATA:  Altered mental status with neck pain and back pain concern for meningitis request received for lumbar puncture. EXAM: DIAGNOSTIC LUMBAR PUNCTURE UNDER FLUOROSCOPIC GUIDANCE COMPARISON:  CT head imaging report reviewed prior to the procedure performed same day. FLUOROSCOPY TIME:  Fluoroscopy Time:  0.2 minute Radiation Exposure Index (if provided by the fluoroscopic device): 1.10 mGy Number of Acquired Spot Images: 1 PROCEDURE: Informed consent was obtained from the patient's wife prior to the procedure, including potential complications of bleeding, infection, CSF leak and need for additional procedures, inability to perform the procedure if patient is unable to cooperate, nerve damage, headache, allergy, and pain. With the patient prone, the  lower back was prepped with Betadine. 1% Lidocaine was used for local anesthesia. Lumbar puncture was attempted to be performed at the L4-L5 level using a 22 gauge needle, however the patient was unable to tolerate the procedure or lie flat, the needle was removed in its entirety and the procedure was aborted. No immediate complications, sterile dressing applied. IMPRESSION: Unsuccessful attempt at fluoroscopic guided lumbar puncture secondary to patient's inability to tolerate the procedure, procedure was aborted for safety reasons. The ordering provider was contacted regarding the inability to perform the procedure. Read By: Tsosie Billing PA-C Electronically Signed   By: Edison Nasuti  Chancy Milroy M.D.   On: 11/30/2020 17:01     Medications   Scheduled Meds:  Chlorhexidine Gluconate Cloth  6 each Topical Q0600   docusate  100 mg Per Tube BID   hydrALAZINE       mouth rinse  15 mL Mouth Rinse BID   morphine       mupirocin ointment  1 application Nasal BID   polyethylene glycol  17 g Per Tube Daily   Continuous Infusions:  ceFTAROline (TEFLARO) IV 600 mg (12/01/20 1613)   dextrose 5 % and 0.45 % NaCl with KCl 40 mEq/L 100 mL/hr at 12/01/20 1510   niCARDipine 2.5 mg/hr (12/01/20 1614)   propofol (DIPRIVAN) infusion 10 mcg/kg/min (12/01/20 1920)   vancomycin Stopped (12/01/20 0907)       LOS: 1 day    Time spent: 40 minutes with greater than 50% at bedside in coordination of care    Ezekiel Slocumb, DO [facility]  12/01/2020, 7:43 PM      If 7PM-7AM, please contact night-coverage. How to contact the [facility] Attending or Consulting provider [place] or covering provider during after hours [place], for this patient?    Check the care team in [facility] and look for a) attending/consulting [facility] provider listed and b) the [facility] team listed Log into [email or web address] and use [place]'s universal password to access. If you do not have the password, please contact the hospital operator. Locate the [facility]  provider you are looking for under [facility] and page to a number that you can be directly reached. If you still have difficulty reaching the provider, please page the [facility] (Director on Call) for the Hospitalists listed on amion for assistance.

## 2020-12-01 NOTE — Consult Note (Signed)
Neurosurgery-New Consultation Evaluation 12/01/2020 Caleb Fisher [id number]  Identifying Statement: Caleb Fisher is a 45 y.o. male from [place] [id number] with   Physician Requesting Consultation: Rufina Falco, hospitalist  History of Present Illness: Caleb Fisher is here for evaluation of altered mental status but was also endorsing recent falls and neck pain.  He was noted by his wife to be confused for the past couple of days and on arrival there was concern for sepsis.  A lumbar puncture was attempted but unable to be obtained but blood cultures were sent and the patient was started on antibiotics.  Given the sepsis concern, imaging of the body was obtained first with CT which was unrevealing but then with an MRI of the total spine which did reveal concern for epidural abscess of the cervical spine.  We are consulted to evaluate and treat.  Past Medical History:  History reviewed. No pertinent past medical history.  Social History: Social History   Socioeconomic History   Marital status: Married    Spouse name: Not on file   Number of children: Not on file   Years of education: Not on file   Highest education level: Not on file  Occupational History   Not on file  Tobacco Use   Smoking status: Not on file   Smokeless tobacco: Not on file  Substance and Sexual Activity   Alcohol use: Yes   Drug use: Not on file   Sexual activity: Not on file  Other Topics Concern   Not on file  Social History Narrative   Not on file   Social Determinants of Health   Financial Resource Strain: Not on file  Food Insecurity: Not on file  Transportation Needs: Not on file  Physical Activity: Not on file  Stress: Not on file  Social Connections: Not on file  Intimate Partner Violence: Not on file    Family History: No family history on file.  Review of Systems:  Review of Systems -review of systems unable to be obtained given patient's altered mental status  Physical Exam: BP (!)  151/99 (BP Location: Left Arm)   Pulse (!) 112   Temp 98.4 F (36.9 C) (Oral)   Resp (!) 31   Ht 5\' 9"  (1.753 m)   Wt 73.4 kg   SpO2 96%   BMI 23.90 kg/m  Body mass index is 23.9 kg/m. Body surface area is 1.89 meters squared. General appearance: Arouses to voice Head: Normocephalic, atraumatic Ext: Extremities are warm  Neurologic exam:  Mental status: alertness: Arouses to voice, oriented to name but stated wrong hospital Cranial nerves:  Eyes open to voice, able to count fingers Face appears to be symmetric Hearing is grossly intact Motor:strength is 5 out of 5 in bilateral biceps, triceps, grip.  He has 5-5 strength in bilateral hip flexion, dorsiflexion of the feet.  Follows commands in all extremities Sensory: intact to light touch in all extremities Gait: Not tested  Laboratory: Results for orders placed or performed during the hospital encounter of 11/30/20  Blood Culture (routine x 2)   Specimen: BLOOD  Result Value Ref Range   Specimen Description BLOOD LEFT ANTECUBITAL    Special Requests      BOTTLES DRAWN AEROBIC AND ANAEROBIC Blood Culture results may not be optimal due to an excessive volume of blood received in culture bottles   Culture  Setup Time      Organism ID to follow GRAM POSITIVE COCCI AEROBIC BOTTLE ONLY CRITICAL RESULT CALLED TO, READ  BACK BY AND VERIFIED WITH: Performed at [facility], [address] [place], [place] [address]    Culture GRAM POSITIVE COCCI    Report Status PENDING   Resp Panel by RT-PCR (Flu A&B, Covid) Nasopharyngeal Swab   Specimen: Nasopharyngeal Swab; Nasopharyngeal(NP) swabs in vial transport medium  Result Value Ref Range   SARS Coronavirus 2 by RT PCR NEGATIVE NEGATIVE   Influenza A by PCR NEGATIVE NEGATIVE   Influenza B by PCR NEGATIVE NEGATIVE  Comprehensive metabolic panel  Result Value Ref Range   Sodium 135 135 - 145 mmol/L   Potassium 3.0 (L) 3.5 - 5.1 mmol/L   Chloride 99 98 - 111 mmol/L   CO2  23 22 - 32 mmol/L   Glucose, Bld 117 (H) 70 - 99 mg/dL   BUN 54 (H) 6 - 20 mg/dL   Creatinine, Ser 1.58 (H) 0.61 - 1.24 mg/dL   Calcium 8.4 (L) 8.9 - 10.3 mg/dL   Total Protein 6.5 6.5 - 8.1 g/dL   Albumin 2.9 (L) 3.5 - 5.0 g/dL   AST 102 (H) 15 - 41 U/L   ALT 55 (H) 0 - 44 U/L   Alkaline Phosphatase 166 (H) 38 - 126 U/L   Total Bilirubin 1.1 0.3 - 1.2 mg/dL   GFR, Estimated 54 (L) >60 mL/min   Anion gap 13 5 - 15  Ethanol  Result Value Ref Range   Alcohol, Ethyl (B) <10 <10 mg/dL  CBC with Differential  Result Value Ref Range   WBC 14.2 (H) 4.0 - 10.5 K/uL   RBC 4.30 4.22 - 5.81 MIL/uL   Hemoglobin 12.8 (L) 13.0 - 17.0 g/dL   HCT 36.6 (L) 39.0 - 52.0 %   MCV 85.1 80.0 - 100.0 fL   MCH 29.8 26.0 - 34.0 pg   MCHC 35.0 30.0 - 36.0 g/dL   RDW 14.9 11.5 - 15.5 %   Platelets 125 (L) 150 - 400 K/uL   nRBC 0.0 0.0 - 0.2 %   Neutrophils Relative % 90 %   Neutro Abs 12.8 (H) 1.7 - 7.7 K/uL   Lymphocytes Relative 4 %   Lymphs Abs 0.6 (L) 0.7 - 4.0 K/uL   Monocytes Relative 4 %   Monocytes Absolute 0.6 0.1 - 1.0 K/uL   Eosinophils Relative 0 %   Eosinophils Absolute 0.0 0.0 - 0.5 K/uL   Basophils Relative 1 %   Basophils Absolute 0.1 0.0 - 0.1 K/uL   WBC Morphology Abnormal lymphocytes present    RBC Morphology MIXED RBC POPULATION    Smear Review PLATELETS APPEAR ADEQUATE    Immature Granulocytes 1 %   Abs Immature Granulocytes 0.13 (H) 0.00 - 0.07 K/uL  Urine Drug Screen, Qualitative  Result Value Ref Range   Tricyclic, Ur Screen NONE DETECTED NONE DETECTED   Amphetamines, Ur Screen NONE DETECTED NONE DETECTED   MDMA (Ecstasy)Ur Screen NONE DETECTED NONE DETECTED   Cocaine Metabolite,Ur [place] NONE DETECTED NONE DETECTED   Opiate, Ur Screen POSITIVE (A) NONE DETECTED   Phencyclidine (PCP) Ur S NONE DETECTED NONE DETECTED   Cannabinoid 50 Ng, Ur [place] NONE DETECTED NONE DETECTED   Barbiturates, Ur Screen NONE DETECTED NONE DETECTED   Benzodiazepine, Ur Scrn NONE DETECTED NONE  DETECTED   Methadone Scn, Ur NONE DETECTED NONE DETECTED  Salicylate level  Result Value Ref Range   Salicylate Lvl 09.3 7.0 - 30.0 mg/dL  Lactic acid, plasma  Result Value Ref Range   Lactic Acid, Venous 2.0 (HH) 0.5 -  1.9 mmol/L  Lactic acid, plasma  Result Value Ref Range   Lactic Acid, Venous 2.2 (HH) 0.5 - 1.9 mmol/L  Magnesium  Result Value Ref Range   Magnesium 2.4 1.7 - 2.4 mg/dL  TSH  Result Value Ref Range   TSH 0.267 (L) 0.350 - 4.500 uIU/mL  Procalcitonin - Baseline  Result Value Ref Range   Procalcitonin 17.28 ng/mL  Protime-INR  Result Value Ref Range   Prothrombin Time 15.6 (H) 11.4 - 15.2 seconds   INR 1.2 0.8 - 1.2  APTT  Result Value Ref Range   aPTT 39 (H) 24 - 36 seconds  Urinalysis, Complete w Microscopic Urine, Clean Catch  Result Value Ref Range   Color, Urine AMBER (A) YELLOW   APPearance HAZY (A) CLEAR   Specific Gravity, Urine 1.024 1.005 - 1.030   pH 5.0 5.0 - 8.0   Glucose, UA NEGATIVE NEGATIVE mg/dL   Hgb urine dipstick MODERATE (A) NEGATIVE   Bilirubin Urine NEGATIVE NEGATIVE   Ketones, ur NEGATIVE NEGATIVE mg/dL   Protein, ur 100 (A) NEGATIVE mg/dL   Nitrite NEGATIVE NEGATIVE   Leukocytes,Ua NEGATIVE NEGATIVE   RBC / HPF 21-50 0 - 5 RBC/hpf   WBC, UA 6-10 0 - 5 WBC/hpf   Bacteria, UA NONE SEEN NONE SEEN   Squamous Epithelial / LPF 0-5 0 - 5   Mucus PRESENT    Hyaline Casts, UA PRESENT   Ammonia  Result Value Ref Range   Ammonia 14 9 - 35 umol/L  Blood gas, venous  Result Value Ref Range   pH, Ven 7.40 7.250 - 7.430   pCO2, Ven 40 (L) 44.0 - 60.0 mmHg   pO2, Ven PENDING 32.0 - 45.0 mmHg   Bicarbonate 24.8 20.0 - 28.0 mmol/L   Acid-Base Excess 0.0 0.0 - 2.0 mmol/L   O2 Saturation 52.8 %   Patient temperature 37.0    Collection site VENOUS    Sample type VENOUS   Acetaminophen level  Result Value Ref Range   Acetaminophen (Tylenol), Serum <10 (L) 10 - 30 ug/mL  Lactic acid, plasma  Result Value Ref Range   Lactic Acid,  Venous 1.7 0.5 - 1.9 mmol/L  Troponin I (High Sensitivity)  Result Value Ref Range   Troponin I (High Sensitivity) 54 (H) <18 ng/L  Troponin I (High Sensitivity)  Result Value Ref Range   Troponin I (High Sensitivity) 75 (H) <18 ng/L  Troponin I (High Sensitivity)  Result Value Ref Range   Troponin I (High Sensitivity) 193 (HH) <18 ng/L   I personally reviewed labs  Imaging: Results for orders placed during the hospital encounter of 11/30/20  MR BRAIN W WO CONTRAST  Addendum 11/30/2020 10:11 PM ADDENDUM REPORT: 11/30/2020 22:08  ADDENDUM: These results were called by telephone at the time of interpretation on 11/30/2020 at 10:08 pm to provider [facility] , who verbally acknowledged these results.   Electronically Signed By: Ulyses Jarred M.D. On: 11/30/2020 22:08  Narrative CLINICAL DATA:  Neck pain and altered mental status  EXAM: MRI HEAD WITHOUT AND WITH CONTRAST  MRI CERVICAL SPINE WITHOUT AND WITH CONTRAST  TECHNIQUE: Multiplanar, multiecho pulse sequences of the brain and surrounding structures, and cervical spine, to include the craniocervical junction and cervicothoracic junction, were obtained without and with intravenous contrast.  CONTRAST:  7 mL Gadavist  COMPARISON:  None.  FINDINGS: MRI HEAD FINDINGS  Brain: No acute infarct, mass effect or extra-axial collection. No acute or chronic hemorrhage. There is multifocal hyperintense T2-weighted  signal within the white matter. Parenchymal volume and CSF spaces are normal. 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 The midline structures are normal.  Vascular: Major flow voids are preserved.  Skull and upper cervical spine: Normal calvarium and skull base. Visualized upper cervical spine and soft tissues are normal.  Sinuses/Orbits:No paranasal sinus fluid levels or advanced mucosal thickening. No mastoid or middle ear effusion. Normal orbits.  MRI CERVICAL SPINE FINDINGS  Alignment:  Physiologic.  Vertebrae: No fracture, evidence of discitis, or bone lesion.  Cord: Spinal cord itself is normal, but there is a circumferential collection the length the cervical spine with peripheral contrast enhancement. The thickness of the collection is greatest at the C2 and C3 levels, measuring up to 6 mm in the 7 o'clock position of the spinal canal.  Posterior Fossa, vertebral arteries, paraspinal tissues: Small prevertebral effusion from C2-C5, measuring 7 mm in thickness.  Disc levels:  There is mild multilevel degenerative disc disease, greatest at C5-6. There is no bony spinal canal stenosis. The CSF space is effaced along the entire length of the cervical spine due to the above described epidural collection.  IMPRESSION: 1. 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 2. No spinal cord signal change. 3. Small prevertebral effusion from C2-C5. 4. No acute intracranial abnormality. 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  Electronically Signed: By: Ulyses Jarred M.D. On: 11/30/2020 21:51  I personally reviewed radiology studies to include:   Impression/Plan:  Mr. Yardley is here for evaluation of sepsis and found to have epidural abscess in the cervical spine.  Given this finding, I did discuss with the wife that we could proceed with a decompression or neuro to gain more specimen to guide antibiotic therapy as well as decompress the spinal cord.  His exam at this time appears intact but we did discuss that with the infection, he could have a decline without a decompression.  She expressed understanding and would like to proceed with the surgery.  We discussed the C2-6 laminectomy and decompression.  We discussed the risk of hematoma, infection, damage to the spinal cord, risk of anesthesia.   1.   Diagnosis: Cervical epidural abscess  2.  Plan -Proceed with cervical laminectomy and decompression

## 2020-12-01 NOTE — Progress Notes (Signed)
Per md, gave 40mg  of hydralazine for bp of 192/126. Per dr Cari Caraway, after 3rd bag of platelets and all K bags being completed, wait 1 hour and redraw cbc and bmp before hanging anymore platelets, rbcs or K.

## 2020-12-01 NOTE — Progress Notes (Addendum)
    Attending Progress Note  History: Caleb Fisher is here for altered mental status, sepsis from MRSA, and cervical epidural abscess.    HD2: Attempted to go to OR overnight but lab abnormalities caused cancellation due to DIC. He cannot answer questions currently.   History of Present Illness (Dr. Lacinda Axon consult 12/01/20): Caleb Fisher is here for evaluation of altered mental status but was also endorsing recent falls and neck pain.  He was noted by his wife to be confused for the past couple of days and on arrival there was concern for sepsis.  A lumbar puncture was attempted but unable to be obtained but blood cultures were sent and the patient was started on antibiotics.  Given the sepsis concern, imaging of the body was obtained first with CT which was unrevealing but then with an MRI of the total spine which did reveal concern for epidural abscess of the cervical spine.  We are consulted to evaluate and treat.  Physical Exam: Vitals:   12/01/20 0737 12/01/20 0800  BP: (!) 193/124 (!) 186/120  Pulse: (!) 112 (!) 116  Resp: (!) 29 (!) 26  Temp: 100.3 F (37.9 C)   SpO2: 95% 94%   Restless. OE spontaneously.  Mumbles but answers questions. AA Ox3 CNI  Strength:5/5 throughout BUE and BLE grossly - he mostly completes neurological exam but is restless.  He is moving himself in bed easily  Sensory - unreliable but clearly feels  Data:  Recent Labs  Lab 11/30/20 1310 12/01/20 0157  NA 135 137  K 3.0* 2.4*  CL 99 103  CO2 23 23  BUN 54* 38*  CREATININE 1.58* 1.15  GLUCOSE 117* 86  CALCIUM 8.4* 8.2*   Recent Labs  Lab 12/01/20 0157  AST 97*  ALT 49*  ALKPHOS 129*     Recent Labs  Lab 12/01/20 0030 12/01/20 0147 12/01/20 0157  WBC 8.8  --  13.0*  HGB 9.8*  --  11.3*  HCT 28.1*  --  32.6*  PLT NO PLT 77* 80*   Recent Labs  Lab 11/30/20 1310 12/01/20 0147  APTT 39* 41*  INR 1.2 1.3*         Other tests/results: cultures - MRSA  Assessment/Plan:  Caleb Fisher is here with sepsis and epidural abscess from MRSA  - Appreciate medicine, ICU, ID involvement - pain control - hold DVT prophylaxis for now - Will discuss with Dr. Delaine Lame about washout.  If he is medically stable, he is likely to need cervical laminectomy for I&D later today. - MAP goals 70-110, discussed with Dr. Lake Bells - Abx per ID - Defer management of LFT abnormalities and elevated troponin to ICU and medicine team  Meade Maw MD, [facility] Department of Neurosurgery

## 2020-12-01 NOTE — Progress Notes (Signed)
Late entry:  Patient arrived from ED with ED RN, NT and wife at bedside with patient to go into OR. PACU met them at OR desk. Pt was able to respond verbally but was difficult to understand, A&O x 2. Restless in bed. Anesthesia came to interview patient prior to surgery. Due to concerns of low platelets patient was taken to PACU to obtain another round of lab work to recheck labs. In PACU patient was placed on monitor. ST110s, BP elevated 150s/100s, RR 26-30 patient on 2L [place]. Patient stated he needed to void but unable to do so, so PACU RN placed foley catheter per order. 1078mL clear yellow urine drained immediately. Lab work was drawn shortly after and PACU RN continued to monitor patient while waiting. Wife still at patient bedside. Patient seemed to be resting more comfortably after foley inserted. Anesthesia aware of BP and due to surgery plan to wait until OR to control BP. Some labs come back approx 1.5 hrs holding in PACU and it was decided between Dr. Lacinda Axon (surgeon) and Dr. Wynetta Emery (anes) that patient would go to ICU to get some platelets and possible hematology consult before taking patient into the OR. PACU RN transferred patient to ICU and gave ICU RN updates while we were holding. Patient was still able to follow commands and remained and A&O x2 at this time. BP 170s/100s, RR 26-30, ST 100s, 95% 2LNC

## 2020-12-01 NOTE — Consult Note (Signed)
Hematology/Oncology Consult note [facility] Telephone:([phone] [phone]  Patient Care Team: Pcp, No as PCP - General   Name of the patient: Caleb Fisher  [id number]  [birth year]   Date of visit: 12/01/20 REASON FOR COSULTATION:   History of presenting illness-  45 y.o. male with PMH listed at below who presents to ER for evaluation of altered mental status, neck and back pain, frequent falls, UDS negative except for opiates, patient was found to meet criteria of sepsis with tachycardia, tachypnea, lactic acidosis, leukocytosis and encephalopathy.   11/30/2020, CT head showed no intracranial bleeding.  No focal mass.  2.1 cm area of low density in the left parietal occipital cortex, possibly suggesting encephalomalacia from previous infarction. CT cervical spine without contrast showed no fracture or static subluxation of cervical spine.  Focally moderate disc space height loss and osteophytosis at C5-C6, with otherwise preserved disc spaces. Small, broad-based posterior disc bulge at C5-C6. Cervical disc, neural foraminal, and spinal cord pathology may be further evaluated by MRI if indicated by neurologically localizing signs and symptoms.3. Partially cavitary nodule of the included left pulmonary apex measuring 0.7 x 0.7 cm.  CT chest abdomen pelvis showed Mild right-sided periureteral inflammatory fat stranding which may represent sequelae associated with acute pyelonephritis versus recently passed renal calculus. Multiple noncalcified lung nodules within both lungs. 1.2 cm x 1.1 cm nonobstructing renal calculus within the mid right kidney. 5. Marked severity degenerative changes at the level of L5-S1.6. Aortic atherosclerosis.  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  11/30/2020, initial CBC showed white count of 14.2,  hemoglobin 12.8, platelet count 125,000.  Differential showed neutrophilia, increased absolute immature granulocytes.+ MRSA by PCR.  Blood culture showed Staphylococcus aureus,  Repeat CBC on 12/01/2020 morning showed WBC of 13, hemoglobin 11.3, platelet count 80,000.  Elevated INR hematology oncology was consulted for thrombocytopenia, possible DIC. Patient's is status post platelet transfusion to keep platelet count above 100 for neurosurgery.  Platelet responded appropriately after platelet transfusion.  Patient was seen by ID, neurosurgeon. Patient is on IV vancomycin, and and the plan to have epidural abscess drainage this afternoon.   Review of Systems  Unable to perform ROS: Mental status change   No Known Allergies  Patient Active Problem List   Diagnosis Date Noted   Encephalopathy acute 11/30/2020     History reviewed. No pertinent past medical history.   History reviewed. No pertinent surgical history.  Social History   Socioeconomic History   Marital status: Married    Spouse name: Not on file   Number of children: Not on file   Years of education: Not on file   Highest education level: Not on file  Occupational History   Not on file  Tobacco Use   Smoking status: Not on file   Smokeless tobacco: Not on file  Substance and Sexual Activity   Alcohol use: Yes   Drug use: Not on file   Sexual activity: Not on file  Other Topics Concern   Not on file  Social History Narrative   Not on file   Social Determinants of Health   Financial Resource Strain: Not on file  Food Insecurity: Not on file  Transportation Needs: Not on file  Physical Activity: Not on file  Stress: Not on file  Social Connections: Not on file  Intimate Partner Violence: Not on file  No family history on file.   Current Facility-Administered Medications:    [MAR Hold] acetaminophen (TYLENOL) tablet 650 mg, 650 mg, Oral, Q6H PRN **OR** [MAR Hold] acetaminophen (TYLENOL) suppository  650 mg, 650 mg, Rectal, Q6H PRN, Richarda Osmond, MD, 650 mg at 11/30/20 2152   [facility] Hold] ceftaroline (TEFLARO) 600 mg in sodium chloride 0.9 % 100 mL IVPB, 600 mg, Intravenous, Q8H, Ravishankar, Joellyn Quails, MD, Last Rate: 100 mL/hr at 12/01/20 1613, 600 mg at 12/01/20 1613   [MAR Hold] Chlorhexidine Gluconate Cloth 2 % PADS 6 each, 6 each, Topical, Q0600, Richarda Osmond, MD, 6 each at 12/01/20 0427   dextrose 5 % and 0.45 % NaCl with KCl 40 mEq/L infusion, , Intravenous, Continuous, Simonne Maffucci B, MD, Last Rate: 100 mL/hr at 12/01/20 1510, New Bag at 12/01/20 1510   hydrALAZINE (APRESOLINE) 20 MG/ML injection, , , ,    [MAR Hold] hydrALAZINE (APRESOLINE) injection 10-40 mg, 10-40 mg, Intravenous, Q4H PRN, Simonne Maffucci B, MD, 40 mg at 12/01/20 0757   [MAR Hold] labetalol (NORMODYNE) injection 10-20 mg, 10-20 mg, Intravenous, Q4H PRN, Simonne Maffucci B, MD, 20 mg at 12/01/20 0835   [MAR Hold] LORazepam (ATIVAN) tablet 1 mg, 1 mg, Oral, Q6H PRN **OR** [MAR Hold] LORazepam (ATIVAN) injection 1 mg, 1 mg, Intramuscular, Q6H PRN, Richarda Osmond, MD, 1 mg at 11/30/20 2030   [facility] Hold] MEDLINE mouth rinse, 15 mL, Mouth Rinse, BID, McQuaid, Douglas B, MD, 15 mL at 12/01/20 1017   [MAR Hold] morphine 2 MG/ML injection 1-2 mg, 1-2 mg, Intravenous, Q4H PRN, Darel Hong D, NP, 1 mg at 12/01/20 1442   morphine 2 MG/ML injection, , , ,    [MAR Hold] mupirocin ointment (BACTROBAN) 2 % 1 application, 1 application, Nasal, BID, Simonne Maffucci B, MD, 1 application at 11/10/83 1615   nicardipine (CARDENE) 20mg  in 0.86% saline 298ml IV infusion (0.1 mg/ml), 3-15 mg/hr, Intravenous, Continuous, Darel Hong D, NP, Last Rate: 25 mL/hr at 12/01/20 1614, 2.5 mg/hr at 12/01/20 1614   [MAR Hold] ondansetron (ZOFRAN) tablet 4 mg, 4 mg, Oral, Q6H PRN **OR** [MAR Hold] ondansetron (ZOFRAN) injection 4 mg, 4 mg, Intravenous, Q6H PRN, Richarda Osmond, MD   [MAR Hold] oxyCODONE (Oxy IR/ROXICODONE)  immediate release tablet 5 mg, 5 mg, Oral, Q4H PRN, Doristine Mango L, MD   potassium chloride 10 mEq in 100 mL IVPB, 10 mEq, Intravenous, Q1 Hr x 4, McQuaid, Douglas B, MD, Last Rate: 100 mL/hr at 12/01/20 1607, 10 mEq at 12/01/20 1607   [MAR Hold] vancomycin (VANCOREADY) IVPB 1000 mg/200 mL, 1,000 mg, Intravenous, Q12H, Belue, Alver Sorrow, RPH, Last Rate: 200 mL/hr at 12/01/20 0817, Infusion Verify at 12/01/20 2778  Facility-Administered Medications Ordered in Other Encounters:    fentaNYL (SUBLIMAZE) injection, , Intravenous, Anesthesia Intra-op, Fletcher-Harrison, Tawana, CRNA, 50 mcg at 12/01/20 1713   lidocaine (cardiac) 100 mg/63mL (XYLOCAINE) injection 2%, , Intravenous, Anesthesia Intra-op, Fletcher-Harrison, Tawana, CRNA, 100 mg at 12/01/20 1713   propofol (DIPRIVAN) 10 mg/mL bolus/IV push, , Intravenous, Anesthesia Intra-op, Fletcher-Harrison, Tawana, CRNA, 130 mg at 12/01/20 1714   succinylcholine (ANECTINE) syringe, , Intravenous, Anesthesia Intra-op, Fletcher-Harrison, Tawana, CRNA, 100 mg at 12/01/20 1715   Physical exam:  Vitals:   12/01/20 1500 12/01/20 1600 12/01/20 1619 12/01/20 1700  BP: (!) 143/95 135/87 (!) 140/91 135/89  Pulse: (!) 114 (!) 115 (!) 114 (!) 114  Resp: (!) 25 (!) 27 (!) 28 (!) 26  Temp:   99.1 F (37.3 C)   TempSrc:  Oral   SpO2: 91% 92% (!) 89% 93%  Weight:      Height:       Physical Exam HENT:     Head: Normocephalic.  Cardiovascular:     Rate and Rhythm: Normal rate.  Pulmonary:     Effort: Pulmonary effort is normal.  Abdominal:     Palpations: Abdomen is soft.  Skin:    General: Skin is warm and dry.     Findings: No bruising.     Comments: Multiple excoriation on extremities.  Neurological:     Comments: Lethargic        CMP Latest Ref Rng & Units 12/01/2020  Glucose 70 - 99 mg/dL 83  BUN 6 - 20 mg/dL 34(H)  Creatinine 0.61 - 1.24 mg/dL 1.01  Sodium 135 - 145 mmol/L 136  Potassium 3.5 - 5.1 mmol/L 2.3(LL)  Chloride 98 - 111  mmol/L 102  CO2 22 - 32 mmol/L 23  Calcium 8.9 - 10.3 mg/dL 8.1(L)  Total Protein 6.5 - 8.1 g/dL -  Total Bilirubin 0.3 - 1.2 mg/dL -  Alkaline Phos 38 - 126 U/L -  AST 15 - 41 U/L -  ALT 0 - 44 U/L -   CBC Latest Ref Rng & Units 12/01/2020  WBC 4.0 - 10.5 K/uL 15.6(H)  Hemoglobin 13.0 - 17.0 g/dL 11.0(L)  Hematocrit 39.0 - 52.0 % 31.8(L)  Platelets 150 - 400 K/uL 104(L)    RADIOGRAPHIC STUDIES: I have personally reviewed the radiological images as listed and agreed with the findings in the report. DG Lumbar Spine 2-3 Views  Result Date: 11/30/2020 CLINICAL DATA:  Back pain EXAM: LUMBAR SPINE - 2-3 VIEW COMPARISON:  None. FINDINGS: Normal alignment. The vertebral body heights are well preserved. Multilevel ventral endplate spurring and disc space narrowing is identified. This is most severe at the L5-S1 level. Mild facet degenerative change identified at L3-4, L4-5 and L5-S1. IMPRESSION: 1. No acute findings. 2. Degenerative disc disease and facet arthropathy. Electronically Signed   By: Kerby Moors M.D.   On: 11/30/2020 14:24   CT Head Wo Contrast  Result Date: 11/30/2020 CLINICAL DATA:  Altered mental status EXAM: CT HEAD WITHOUT CONTRAST TECHNIQUE: Contiguous axial images were obtained from the base of the skull through the vertex without intravenous contrast. COMPARISON:  None. FINDINGS: Brain: Ventricles are not dilated. There is no shift of midline structures. Cortical sulci are prominent. There is 2 x 1 cm low-density in the left parieto-occipital cortex immediately above the tentorium, possibly suggesting encephalomalacia from previous infarction. There is no focal edema or mass effect. There are no signs of bleeding within the cranium. Vascular: There are scattered arterial calcifications. Skull: Unremarkable. Sinuses/Orbits: Unremarkable. Other: None IMPRESSION: There are no signs of bleeding within the cranium. Ventricles are not dilated. There is no focal mass effect. There  is 2 x 1 cm area of low-density in the left parieto-occipital cortex, possibly suggesting encephalomalacia from previous infarction. Follow-up MRI should be considered for further characterization. Electronically Signed   By: Elmer Picker M.D.   On: 11/30/2020 13:51   CT Cervical Spine Wo Contrast  Result Date: 11/30/2020 CLINICAL DATA:  Intermittent, severe neck pain, frequent falls, fall 3 days ago EXAM: CT CERVICAL SPINE WITHOUT CONTRAST TECHNIQUE: Multidetector CT imaging of the cervical spine was performed without intravenous contrast. Multiplanar CT image reconstructions were also generated. COMPARISON:  None. FINDINGS: Alignment: Normal. Skull base and vertebrae: No acute fracture. No primary bone lesion or focal pathologic process. Soft tissues  and spinal canal: No prevertebral fluid or swelling. No visible canal hematoma. Disc levels: Focally moderate disc space height loss and osteophytosis at C5-C6, with otherwise preserved disc spaces. Small, broad-based posterior disc bulge at C5-C6 (series 6, image 27, series 3, image 61). Upper chest: Paraseptal emphysema. Partially cavitary nodule of the included left pulmonary apex measuring 0.7 x 0.7 cm (series 2, image 97). Other: None. IMPRESSION: 1. No fracture or static subluxation of the cervical spine. 2. Focally moderate disc space height loss and osteophytosis at C5-C6, with otherwise preserved disc spaces. Small, broad-based posterior disc bulge at C5-C6. Cervical disc, neural foraminal, and spinal cord pathology may be further evaluated by MRI if indicated by neurologically localizing signs and symptoms. 3. Partially cavitary nodule of the included left pulmonary apex measuring 0.7 x 0.7 cm. This is generally nonspecific and statistically likely to be infectious or inflammatory, however general differential considerations would include septic embolus and metastatic disease. Consider additional dedicated imaging of the chest at this time if  warranted by acute clinical presentation. Otherwise recommend follow-up CT examination of the chest on a nonemergent basis to evaluate for this and other pulmonary nodules. Emphysema (ICD10-J43.9). Electronically Signed   By: Delanna Ahmadi M.D.   On: 11/30/2020 14:27   MR BRAIN W WO CONTRAST  Addendum Date: 11/30/2020   ADDENDUM REPORT: 11/30/2020 22:08 ADDENDUM: These results were called by telephone at the time of interpretation on 11/30/2020 at 10:08 pm to provider [facility] , who verbally acknowledged these results. Electronically Signed   By: Ulyses Jarred M.D.   On: 11/30/2020 22:08   Result Date: 11/30/2020 CLINICAL DATA:  Neck pain and altered mental status EXAM: MRI HEAD WITHOUT AND WITH CONTRAST MRI CERVICAL SPINE WITHOUT AND WITH CONTRAST TECHNIQUE: Multiplanar, multiecho pulse sequences of the brain and surrounding structures, and cervical spine, to include the craniocervical junction and cervicothoracic junction, were obtained without and with intravenous contrast. CONTRAST:  7 mL Gadavist COMPARISON:  None. FINDINGS: MRI HEAD FINDINGS Brain: No acute infarct, mass effect or extra-axial collection. No acute or chronic hemorrhage. There is multifocal hyperintense T2-weighted signal within the white matter. Parenchymal volume and CSF spaces are normal. 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 The midline structures are normal. Vascular: Major flow voids are preserved. Skull and upper cervical spine: Normal calvarium and skull base. Visualized upper cervical spine and soft tissues are normal. Sinuses/Orbits:No paranasal sinus fluid levels or advanced mucosal thickening. No mastoid or middle ear effusion. Normal orbits. MRI CERVICAL SPINE FINDINGS Alignment: Physiologic. Vertebrae: No fracture, evidence of discitis, or bone lesion. Cord: Spinal cord itself is normal, but there is a circumferential collection the length the cervical spine with peripheral contrast enhancement. The thickness of  the collection is greatest at the C2 and C3 levels, measuring up to 6 mm in the 7 o'clock position of the spinal canal. Posterior Fossa, vertebral arteries, paraspinal tissues: Small prevertebral effusion from C2-C5, measuring 7 mm in thickness. Disc levels: There is mild multilevel degenerative disc disease, greatest at C5-6. There is no bony spinal canal stenosis. The CSF space is effaced along the entire length of the cervical spine due to the above described epidural collection. IMPRESSION: 1. 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 2. No spinal cord signal change. 3. Small prevertebral effusion from C2-C5. 4. No acute intracranial abnormality. 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 Electronically Signed: By: Ulyses Jarred M.D. On: 11/30/2020 21:51   MR CERVICAL SPINE W WO CONTRAST  Addendum Date: 11/30/2020   ADDENDUM REPORT: 11/30/2020 22:08 ADDENDUM: These results were called by telephone at the time of interpretation on 11/30/2020 at 10:08 pm to provider [facility] , who verbally acknowledged these results. Electronically Signed   By: Ulyses Jarred M.D.   On: 11/30/2020 22:08   Result Date: 11/30/2020 CLINICAL DATA:  Neck pain and altered mental status EXAM: MRI HEAD WITHOUT AND WITH CONTRAST MRI CERVICAL SPINE WITHOUT AND WITH CONTRAST TECHNIQUE: Multiplanar, multiecho pulse sequences of the brain and surrounding structures, and cervical spine, to include the craniocervical junction and cervicothoracic junction, were obtained without and with intravenous contrast. CONTRAST:  7 mL Gadavist COMPARISON:  None. FINDINGS: MRI HEAD FINDINGS Brain: No acute infarct, mass effect or extra-axial collection. No acute or chronic hemorrhage. There is multifocal hyperintense T2-weighted signal within the white matter. Parenchymal volume and  CSF spaces are normal. 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 The midline structures are normal. Vascular: Major flow voids are preserved. Skull and upper cervical spine: Normal calvarium and skull base. Visualized upper cervical spine and soft tissues are normal. Sinuses/Orbits:No paranasal sinus fluid levels or advanced mucosal thickening. No mastoid or middle ear effusion. Normal orbits. MRI CERVICAL SPINE FINDINGS Alignment: Physiologic. Vertebrae: No fracture, evidence of discitis, or bone lesion. Cord: Spinal cord itself is normal, but there is a circumferential collection the length the cervical spine with peripheral contrast enhancement. The thickness of the collection is greatest at the C2 and C3 levels, measuring up to 6 mm in the 7 o'clock position of the spinal canal. Posterior Fossa, vertebral arteries, paraspinal tissues: Small prevertebral effusion from C2-C5, measuring 7 mm in thickness. Disc levels: There is mild multilevel degenerative disc disease, greatest at C5-6. There is no bony spinal canal stenosis. The CSF space is effaced along the entire length of the cervical spine due to the above described epidural collection. IMPRESSION: 1. 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 2. No spinal cord signal change. 3. Small prevertebral effusion from C2-C5. 4. No acute intracranial abnormality. 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 Electronically Signed: By: Ulyses Jarred M.D. On: 11/30/2020 21:51   CT CHEST ABDOMEN PELVIS W CONTRAST  Result Date: 11/30/2020 CLINICAL DATA:  Multiple falls. EXAM: CT CHEST, ABDOMEN, AND PELVIS WITH CONTRAST TECHNIQUE: Multidetector CT imaging of the chest, abdomen and pelvis was performed following the standard protocol during bolus administration of intravenous contrast. CONTRAST:  19mL  OMNIPAQUE IOHEXOL 300 MG/ML  SOLN COMPARISON:  None. FINDINGS: CT CHEST FINDINGS Cardiovascular: There is mild calcification of the aortic arch. The ascending thoracic aorta measures 4.4 cm x 4.3 cm. Normal heart size. No pericardial effusion. Mediastinum/Nodes: A 2.6 cm x 2.6 cm anterolateral right paratracheal lymph node is seen. Mild right hilar lymphadenopathy is also noted. Thyroid gland, trachea, and esophagus demonstrate no significant findings. Lungs/Pleura: Subcentimeter noncalcified lung nodules versus focal scars are seen along the posterolateral aspect of the left apex (axial CT images 7 through 11, CT series 4). A 9 mm noncalcified lung nodule is seen within the lateral aspect of the left lower lobe (axial CT image 34, CT series 4). A 1.1 cm noncalcified lung nodule is seen within the posteromedial aspect of the  right lung base (axial CT image 43, CT series 4). An 8 mm noncalcified lung nodule is seen within the lateral aspect of the right lung base (axial CT image 38, CT series 4). A 5 mm anterior left basilar noncalcified lung nodule is present (axial CT image 38, CT series 4). There is no evidence of an acute infiltrate, pleural effusion or pneumothorax. Musculoskeletal: No chest wall mass or suspicious bone lesions identified. CT ABDOMEN PELVIS FINDINGS Hepatobiliary: No focal liver abnormality is seen. No gallstones, gallbladder wall thickening, or biliary dilatation. Pancreas: Unremarkable. No pancreatic ductal dilatation or surrounding inflammatory changes. Spleen: Normal in size without focal abnormality. Adrenals/Urinary Tract: Adrenal glands are unremarkable. Kidneys are normal in size, without obstructing renal calculi or focal lesion. A 1.2 cm x 1.1 cm nonobstructing renal calculus is seen within the mid right kidney. Mild right-sided hydronephrosis is seen. No ureteral calculi are identified. Mild Peri ureteral inflammatory fat stranding is seen. This extends along the anterior aspect of  the right psoas muscle. Bladder is unremarkable. Stomach/Bowel: Stomach is within normal limits. Appendix appears normal. No evidence of bowel wall thickening, distention, or inflammatory changes. Vascular/Lymphatic: Aortic atherosclerosis. No enlarged abdominal or pelvic lymph nodes. Reproductive: Prostate is unremarkable. Other: No abdominal wall hernia or abnormality. No abdominopelvic ascites. Musculoskeletal: No acute osseous abnormalities are identified. Marked severity degenerative changes are seen at the level of L5-S1. IMPRESSION: 1. Mild right-sided periureteral inflammatory fat stranding which may represent sequelae associated with acute pyelonephritis versus recently passed renal calculus. Correlation with urinalysis is recommended. 2. Multiple noncalcified lung nodules within both lungs, as described above. Non-contrast chest CT at 3-6 months is recommended. If the nodules are stable at time of repeat CT, then future CT at 18-24 months (from today's scan) is considered optional for low-risk patients, but is recommended for high-risk patients. This recommendation follows the consensus statement: Guidelines for Management of Incidental Pulmonary Nodules Detected on CT Images: From the Fleischner Society 2017; Radiology 2017; 284:228-243. 3. Ascending thoracic aortic aneurysm, measuring 4.4 cm x 4.3 cm. 4. 1.2 cm x 1.1 cm nonobstructing renal calculus within the mid right kidney. 5. Marked severity degenerative changes at the level of L5-S1. 6. Aortic atherosclerosis. Aortic Atherosclerosis (ICD10-I70.0). Electronically Signed   By: Virgina Norfolk M.D.   On: 11/30/2020 16:25   CT T-SPINE NO CHARGE  Result Date: 11/30/2020 CLINICAL DATA:  Altered mental status.  Back pain. EXAM: CT THORACIC SPINE WITHOUT CONTRAST TECHNIQUE: Multidetector CT images of the thoracic were obtained using the standard protocol without intravenous contrast. COMPARISON:  CT chest 11/30/2020 FINDINGS: Alignment: Normal  Vertebrae: No thoracic region fracture or focal bone lesion. Paraspinal and other soft tissues: Negative Disc levels: No significant thoracic region degenerative change. No apparent stenosis of the canal or foramina. No significant facet arthritis. IMPRESSION: No acute or traumatic finding. No significant abnormality in the thoracic region. Electronically Signed   By: Nelson Chimes M.D.   On: 11/30/2020 16:14   CT L-SPINE NO CHARGE  Result Date: 11/30/2020 CLINICAL DATA:  Neck and back pain.  Lethargic.  Multiple falls. EXAM: CT LUMBAR SPINE WITHOUT CONTRAST TECHNIQUE: Multidetector CT imaging of the lumbar spine was performed without intravenous contrast administration. Multiplanar CT image reconstructions were also generated. COMPARISON:  Radiography 11/30/2020 FINDINGS: Segmentation: 5 lumbar type vertebral bodies. Alignment: No malalignment. Vertebrae: No evidence of fracture. Chronic discogenic sclerotic change of the endplates at L2-X5. Paraspinal and other soft tissues: Negative Disc levels: No significant disc level finding at L3-4 or above.  L4-5: Circumferential protrusion of the disc. Facet and ligamentous hypertrophy. Multifactorial stenosis at this level that could cause neural compression. L5-S1: Chronic disc degeneration with sclerotic bone changes as noted above. Shallow protrusion of the disc. Stenosis of the subarticular lateral recesses that could cause neural compression. IMPRESSION: No acute lumbar region finding. 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 Electronically Signed   By: Nelson Chimes M.D.   On: 11/30/2020 16:13   DG Chest Port 1 View  Result Date: 12/01/2020 CLINICAL DATA:  Acute respiratory failure with hypoxia EXAM: PORTABLE CHEST 1 VIEW COMPARISON:  November 30, 2020. FINDINGS: Stable cardiomediastinal silhouette. Mild central pulmonary vascular congestion is noted. Increased bilateral lung opacities  are noted concerning for edema or pneumonia. Bony thorax is unremarkable. IMPRESSION: Mild central pulmonary vascular congestion. Increased bilateral lung opacities are noted concerning for edema or pneumonia. Electronically Signed   By: Marijo Conception M.D.   On: 12/01/2020 09:09   DG Chest Port 1 View  Result Date: 11/30/2020 CLINICAL DATA:  Questionable sepsis.  Evaluate for abnormality. EXAM: PORTABLE CHEST 1 VIEW COMPARISON:  None. FINDINGS: Lungs are clear. Heart and mediastinum are within normal limits. Trachea is midline. No acute bone abnormality. Negative for a pneumothorax. IMPRESSION: No active disease. Electronically Signed   By: Markus Daft M.D.   On: 11/30/2020 14:19   ECHOCARDIOGRAM COMPLETE  Result Date: 12/01/2020    ECHOCARDIOGRAM REPORT   Patient Name:   DOREN KASPAR Date of Exam: 12/01/2020 Medical Rec #:  [id number]   Height:       69.0 in Accession #:    [id number]  Weight:       161.8 lb Date of Birth:  [birth year]   BSA:          1.888 m Patient Age:    75 years    BP:           139/97 mmHg Patient Gender: M           HR:           112 bpm. Exam Location:  [facility] Procedure: 2D Echo, Cardiac Doppler and Color Doppler Indications:     Bacteremia R78.81  History:         Patient has no prior history of Echocardiogram examinations. No                  past medical history on file.  Sonographer:     [name] Referring Phys:  [id number] Tsosie Billing Diagnosing Phys: Kate Sable MD  Sonographer Comments: Suboptimal apical window. IMPRESSIONS  1. Left ventricular ejection fraction, by estimation, is 70 to 75%. The left ventricle has hyperdynamic function. The left ventricle has no regional wall motion abnormalities. Left ventricular diastolic parameters are consistent with Grade I diastolic dysfunction (impaired relaxation).  2. Right ventricular systolic function is normal. The right ventricular size is normal.  3. The mitral valve is normal in structure. No evidence of mitral  valve regurgitation.  4. The aortic valve is grossly normal. Aortic valve regurgitation is not visualized.  5. Aortic dilatation noted. There is mild dilatation of the aortic root, measuring 41 mm. Conclusion(s)/Recommendation(s): No evidence of valvular vegetations on this transthoracic echocardiogram. Consider a transesophageal echocardiogram to exclude infective endocarditis if clinically indicated. FINDINGS  Left Ventricle: Left ventricular ejection fraction, by estimation, is 70 to 75%. The left ventricle has hyperdynamic function. The left ventricle has no regional wall  motion abnormalities. The left ventricular internal cavity size was normal in size. There is no left ventricular hypertrophy. Left ventricular diastolic parameters are consistent with Grade I diastolic dysfunction (impaired relaxation). Right Ventricle: The right ventricular size is normal. No increase in right ventricular wall thickness. Right ventricular systolic function is normal. Left Atrium: Left atrial size was normal in size. Right Atrium: Right atrial size was normal in size. Pericardium: There is no evidence of pericardial effusion. Mitral Valve: The mitral valve is normal in structure. No evidence of mitral valve regurgitation. Tricuspid Valve: The tricuspid valve is normal in structure. Tricuspid valve regurgitation is not demonstrated. Aortic Valve: The aortic valve is grossly normal. Aortic valve regurgitation is not visualized. Aortic valve mean gradient measures 8.0 mmHg. Aortic valve peak gradient measures 13.7 mmHg. Aortic valve area, by VTI measures 4.02 cm. Pulmonic Valve: The pulmonic valve was not well visualized. Pulmonic valve regurgitation is not visualized. Aorta: Aortic dilatation noted. There is mild dilatation of the aortic root, measuring 41 mm. Venous: The inferior vena cava was not well visualized. IAS/Shunts: No atrial level shunt detected by color flow Doppler.  LEFT VENTRICLE PLAX 2D LVIDd:         4.41 cm    Diastology LVIDs:         2.81 cm   LV e' medial:    7.29 cm/s LV PW:         1.16 cm   LV E/e' medial:  10.8 LV IVS:        1.27 cm   LV e' lateral:   7.83 cm/s LVOT diam:     2.30 cm   LV E/e' lateral: 10.0 LV SV:         96 LV SV Index:   51 LVOT Area:     4.15 cm  RIGHT VENTRICLE RV S prime:     19.70 cm/s LEFT ATRIUM             Index        RIGHT ATRIUM           Index LA diam:        3.40 cm 1.80 cm/m   RA Area:     20.40 cm LA Vol (A2C):   52.1 ml 27.59 ml/m  RA Volume:   64.80 ml  34.32 ml/m LA Vol (A4C):   57.3 ml 30.35 ml/m LA Biplane Vol: 55.3 ml 29.29 ml/m  AORTIC VALVE                     PULMONIC VALVE AV Area (Vmax):    3.77 cm      PV Vmax:        1.52 m/s AV Area (Vmean):   3.59 cm      PV Vmean:       106.000 cm/s AV Area (VTI):     4.02 cm      PV VTI:         0.236 m AV Vmax:           185.00 cm/s   PV Peak grad:   9.2 mmHg AV Vmean:          125.000 cm/s  PV Mean grad:   5.0 mmHg AV VTI:            0.240 m       RVOT Peak grad: 10 mmHg AV Peak Grad:      13.7 mmHg AV Mean Grad:  8.0 mmHg LVOT Vmax:         168.00 cm/s LVOT Vmean:        108.000 cm/s LVOT VTI:          0.232 m LVOT/AV VTI ratio: 0.97  AORTA Ao Root diam: 3.83 cm MITRAL VALVE                TRICUSPID VALVE MV Area (PHT): 4.80 cm     TR Peak grad:   14.3 mmHg MV Decel Time: 158 msec     TR Vmax:        189.00 cm/s MV E velocity: 78.40 cm/s MV A velocity: 110.00 cm/s  SHUNTS MV E/A ratio:  0.71         Systemic VTI:  0.23 m                             Systemic Diam: 2.30 cm                             Pulmonic VTI:  0.183 m Kate Sable MD Electronically signed by Kate Sable MD Signature Date/Time: 12/01/2020/3:53:46 PM    Final    US Abdomen Limited RUQ (LIVER/GB)  Result Date: 12/01/2020 CLINICAL DATA:  Abnormal LFTs EXAM: ULTRASOUND ABDOMEN LIMITED RIGHT UPPER QUADRANT COMPARISON:  CT from 11/30/2020 FINDINGS: Gallbladder: Gallbladder is well distended with evidence of gallbladder sludge. No  definitive cholelithiasis is seen. No wall thickening or pericholecystic fluid is noted. Negative sonographic Murphy's sign is elicited. Common bile duct: Diameter: 5.3 mm. Liver: No focal lesion identified. Within normal limits in parenchymal echogenicity. Portal vein is patent on color Doppler imaging with normal direction of blood flow towards the liver. Other: Note is made of known right renal stones without obstructing change. IMPRESSION: Gallbladder sludge without evidence cholelithiasis. Nonobstructing right renal calculi similar to that seen on prior CT. Electronically Signed   By: Inez Catalina M.D.   On: 12/01/2020 15:29   DG Lumbar Puncture Fluoro Guide  Result Date: 11/30/2020 CLINICAL DATA:  Altered mental status with neck pain and back pain concern for meningitis request received for lumbar puncture. EXAM: DIAGNOSTIC LUMBAR PUNCTURE UNDER FLUOROSCOPIC GUIDANCE COMPARISON:  CT head imaging report reviewed prior to the procedure performed same day. FLUOROSCOPY TIME:  Fluoroscopy Time:  0.2 minute Radiation Exposure Index (if provided by the fluoroscopic device): 1.10 mGy Number of Acquired Spot Images: 1 PROCEDURE: Informed consent was obtained from the patient's wife prior to the procedure, including potential complications of bleeding, infection, CSF leak and need for additional procedures, inability to perform the procedure if patient is unable to cooperate, nerve damage, headache, allergy, and pain. With the patient prone, the lower back was prepped with Betadine. 1% Lidocaine was used for local anesthesia. Lumbar puncture was attempted to be performed at the L4-L5 level using a 22 gauge needle, however the patient was unable to tolerate the procedure or lie flat, the needle was removed in its entirety and the procedure was aborted. No immediate complications, sterile dressing applied. IMPRESSION: Unsuccessful attempt at fluoroscopic guided lumbar puncture secondary to patient's inability to  tolerate the procedure, procedure was aborted for safety reasons. The ordering provider was contacted regarding the inability to perform the procedure. Read By: Tsosie Billing PA-C Electronically Signed   By: Maurine Simmering M.D.   On: 11/30/2020 17:01    Assessment and plan-  #Thrombocytopenia, most  likely secondary to acute infection. Added smear, immature platelet fraction Fibrinogen is elevated at 721, less likely DIC. Platelet count was elevated appropriately after platelet transfusion.  #Epidural abscess, plan surgery for epidural abscess drainage this afternoon.  #MRSA bacteremia, Appreciate ID recommendation..  Continue IV vancomycin. HIV is negative  #Lung nodules #Lymphadenopathy, 2.6 byx 2.6 cm anterior lateral right paratracheal lymph node and mild right hilar lymphadenopathy Outpatient follow-up   Thank you for allowing me to participate in the care of this patient.    Earlie Server, MD, PhD  12/01/2020

## 2020-12-01 NOTE — Progress Notes (Signed)
Patient being taken from ICU to OR

## 2020-12-01 NOTE — Progress Notes (Signed)
[facility]  Repeat labs reviewed: K low, PLT better Replace K now  Roselie Awkward, MD [place] [facility] Pager: [phone] Cell: [phone] After 7:00 pm call Elink  [phone]

## 2020-12-01 NOTE — Progress Notes (Signed)
PHARMACY - PHYSICIAN COMMUNICATION CRITICAL VALUE ALERT - BLOOD CULTURE IDENTIFICATION (BCID)  BCID Results:  4 of 4 bottles w/ MRSA  Name of physician contacted: Mauricia Area, MD  Changes to prescribed antibiotics required: Continue Vancomycin and Flagyl, stop Cefepime.  Renda Rolls, PharmD, [facility] 12/01/2020 1:42 AM

## 2020-12-01 NOTE — Interval H&P Note (Signed)
History and Physical Interval Note:  12/01/2020 4:34 PM  Caleb Fisher  has presented today for surgery, with the diagnosis of Epidural abscess.  The various methods of treatment have been discussed with the patient and family. After consideration of risks, benefits and other options for treatment, the patient has consented to: Cervical C2-4 laminectomy for epidural abscess evacuation as a surgical intervention.  The patient's history has been reviewed, patient examined, no change in status, stable for surgery.  I have reviewed the patient's chart and labs.  Questions were answered to the patient's wife's satisfaction.    My partner Dr. Lacinda Axon was prepared to do surgery last evening, but the patient was not stable for surgery.  We discussed the case this morning and he formally transitioned care to me.  I have communicated with Dr. Delaine Lame in infectious disease who has recommend washout for source control of the MRSA infection.    I discussed the planned procedure at length with the patient's wife, including the risks, benefits, alternatives, and indications. The risks discussed include but are not limited to bleeding, infection, need for reoperation, spinal fluid leak, stroke, vision loss, anesthetic complication, coma, paralysis, and even death. I also described in detail that improvement was not guaranteed.  The patient's wife expressed understanding of these risks, and asked that we proceed with surgery. I described the surgery in layman's terms, and gave ample opportunity for questions, which were answered to the best of my ability.  Heart sounds normal no MRG. Chest Clear to Auscultation Bilaterally.   Jayli Fogleman

## 2020-12-01 NOTE — Progress Notes (Addendum)
BRIEF OVERNIGHT PROGRESS REPORT   SUBJECTIVE:  Notified by patient;s RN that patient with increased restless, worsening mental status and generalized pain with abnormal vital signs  OBJECTIVE:  On arrival to the bedside, he was febrile 104.2 (40.1) with blood pressure 200/131 mm Hg and pulse rate 141 beats/min, RR 40 breaths per minute. Patient reporting focal severe back pain. There were no focal neurological deficits; exam as below:  Physical Examination  GENERAL: 45 year-old critically ill patient lying in the bed restless, lethargic and diaphoretic EYES: Pupils equal, round, reactive to light and accommodation. No scleral icterus. Extraocular muscles intact.  HEENT: Head atraumatic, normocephalic. Oropharynx and nasopharynx clear.  NECK:  Supple, no jugular venous distention. No thyroid enlargement, no tenderness.  LUNGS: Normal breath sounds bilaterally, no wheezing, rales,rhonchi or crepitation. No use of accessory muscles of respiration.  CARDIOVASCULAR: S1, S2 normal. No murmurs, rubs, or gallops.  ABDOMEN: Soft, nontender, nondistended. Bowel sounds present. No organomegaly or mass.  EXTREMITIES: No pedal edema, cyanosis, or clubbing.  NEUROLOGIC:  Mental Status: Opens eye to verbal stimuli. Speech slow and almost incomprehensible.  Able to follow simple commands with some difficulty.  Cranial Nerves: II: Discs flat bilaterally; Visual fields grossly normal, pupils equal, round, reactive to light and accommodation III,IV, VI: ptosis not present, extra-ocular motions intact bilaterally V,VII: smile symmetric, facial light touch sensation intact VIII: hearing normal bilaterally IX,X: gag reflex checked XI: Unable to perform bilateral shoulder shrug XII: midline tongue extension Muscle strength in all extremities 4/5, Breaks gravity, able to pull against resistance but unable to resist push. Positive radicular back pain and percussion tenderness. Positive straight leg and  Kernig's,Brudzinksi signs. Sensation intact. Gait not checked. SKIN: No obvious rash, lesion, or ulcer.    Significant Diagnostic Tests:  11/15: MRI Brain/cervical Spine>1. 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 2. No spinal cord signal change. 3. Small prevertebral effusion from C2-C5. 11/15: Noncontrast CT head >There are no signs of bleeding within the cranium. Ventricles are not dilated. There is no focal mass effect. There is 2 x 1 cm area of low-density in the left parieto-occipital cortex, possibly suggesting encephalomalacia from previous infarct 11/15: CT Cervical Spine>. No fracture or static subluxation of the cervical spine. 2. Focally moderate disc space height loss and osteophytosis at C5-C6, with otherwise preserved disc spaces. Small, broad-based posterior disc bulge at C5-C6. Cervical disc, neural foraminal, and spinal cord pathology may be further evaluated by MRI if indicated by neurologically localizing signs and symptoms. 3. Partially cavitary nodule of the included left pulmonary apex measuring 0.7 x 0.7 cm.This is generally nonspecific and statistically likely to be infectious or inflammatory, however general differential considerations would include septic embolus and metastatic disease 11/15: CTA Chest, abdomen and pelvis>1. Mild right-sided periureteral inflammatory fat stranding which may represent sequelae associated with acute pyelonephritis versus recently passed renal calculus. Correlation with urinalysis is recommended. 2. Multiple noncalcified lung nodules within both lungs, as described above. Non-contrast chest CT at 3-6 months is recommended. If the nodules are stable at time of repeat CT, then future CT at 18-24 months (from today's scan) is considered optional for low-risk patients, but is recommended for high-risk patients.  This recommendation follows the consensus statement: Guidelines for Management of Incidental Pulmonary Nodules Detected on CT Images: From the Fleischner Society 2017; Radiology 2017; 284:228-243. 3. Ascending thoracic aortic aneurysm,  measuring 4.4 cm x 4.3 cm. 4. 1.2 cm x 1.1 cm nonobstructing renal calculus within the mid right kidney.  Micro Data:  11/15: SARS-CoV-2 PCR> negative 11/15: Influenza PCR> negative 11/15: Blood culture x2> 11/15: Urine Culture> 11/15: MRSA PCR>>   Antimicrobials:  Acyclovir 11/15 >> x 1 Dose Cefepime 11/15 >> Metronidazole 11/15>> Vancomycin 11/15 >>  ASSESSMENT & PLAN:   45 yr male admitted with falls, neck and back pain and confusion and AMS- had a temp of 104 in the ED- initial CT head and neck without contrast was not yielding. 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  Severe Sepsis due to epidural abscess of the cervical spine  Lactic: 2.2, Baseline PCT: 17.28, MRI cervical spine: + Epidural abscess of the cervical spine Initial interventions/workup included: 2.5 L of NS & Cefepime/ Vancomycin/ Metronidazole - Supplemental oxygen as needed, to maintain SpO2 > 90% - High risk for intubation - BCID results:  4 of 4 bottles with MRSA - Unable to obtain Lumbar Puncture  for  CSF Analysis - Consider HSV PCR, HIV, Entero / other viral PCR, fungal Cx, AFB Smear + Cx, TB PCR, Crypto Ag  - Trend lactic/ PCT - Monitor WBC/ fever curve - Continue IV antibiotic treatment with Cefepime/ Vancomycin/ Metronidazole per ID recommendations. - Strict I/O's - Discussed findings with on call Neurologist Dr. Cheral Marker, Neurosurgeon Dr. Lacinda Axon for possible emergent decompressive laminectomy and debridement of infected tissues pending DIC panel and coags f/u - Discussed with on call ID  specialist Dr. Delaine Lame with recommendations for abx as above and agree with drainage of abscess. - Consult placed STAT to PCCM  Acute Encephalopathy in the setting of sepsis as above - Supportive care - High risk for decompensation - Frequent Neuro checks - Seizure precautions -Treatment of underlying cause as above - Avoid sedatives as able  Thrombocytopenia - Unknown Etiology Abnormal DIC Panel - Monitor for s/s bleeding - Hematology consult     = Goals of Care = Code Status Order: FULL  Primary Emergency Contact: Carelli,Cynthia Wishes to pursue full aggressive treatment and intervention options, including CPR and intubation, but goals of care will be addressed on going with family if that should become necessary.    Rufina Falco, DNP, CCRN, FNP-C, AGACNP-BC Acute Care Nurse Practitioner  [facility] Pager: [phone] [place] [facility]

## 2020-12-01 NOTE — Anesthesia Preprocedure Evaluation (Addendum)
Anesthesia Evaluation  Patient identified by MRN, date of birth, ID band Patient awake and Patient confused  General Assessment Comment:Pt awake and alert to self, place but has a lethargic blunted affect and intermittently tries to get out of bed. He is redirectable. He moves all extremities to command with no gross motor or sensory deficits. He has tachypnea with 2 L [place].   Reviewed: Allergy & Precautions, NPO status , Patient's Chart, lab work & pertinent test results  History of Anesthesia Complications Negative for: history of anesthetic complications  Airway Mallampati: III  TM Distance: >3 FB Neck ROM: full   Comment: Pt did not fully cooperative for adequate pharyngeal exam Dental  (+) Poor Dentition, Missing, Chipped Unable to visualize all teeth. Pt denies having loose teeth:   Pulmonary shortness of breath, Current SmokerPatient did not abstain from smoking.,  Respiratory distress and hypoxemia on 2L [place] with tachypnea  tachypnea  + rhonchi        Cardiovascular negative cardio ROS   Rhythm:Irregular Rate:Tachycardia  Troponemia Tachycardia   Neuro/Psych Cervical epidural abscess with sepsis negative neurological ROS  negative psych ROS   GI/Hepatic negative GI ROS, Neg liver ROS,   Endo/Other  negative endocrine ROSTSH low  Renal/GU Renal disease (AKI)  negative genitourinary   Musculoskeletal negative musculoskeletal ROS (+)   Abdominal Pt with intermittent gaurding  Peds negative pediatric ROS (+)  Hematology  (+) Blood dyscrasia (thrombocytopenia), anemia ,   Anesthesia Other Findings Pt with sepsis and MRSA bacteremia and epidural abscess. 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 Lactic acidosis has cleared but troponemia is being followed and has increased. Sinus tachy on ECG. NSTEMI vs demand ischemia.   TSH low - T4 am per primary  Pt has confusion and head/neck pain. Wife states that his  last known normal conversations was 11/11. She reports he takes large amounts of goody powder to treat chronic back pain. She denies that patient has further medical problems.Per notes, pt ccasionally takes narcotics without prescription. She denies that patient uses IV opioids.   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 Patient vitals showed severe hypertension, tachypnea, and tachycardia. Temperature was 99.4.   They were treated with IV fluids and antibiotics.  MRI Brain: IMPRESSION: 1. 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 2. No spinal cord signal change. 3. Small prevertebral effusion from C2-C5. 4. No acute intracranial abnormality. 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  Reproductive/Obstetrics negative OB ROS                          Anesthesia Physical Anesthesia Plan  ASA: 4 and emergent  Anesthesia Plan: General ETT   Post-op Pain Management:    Induction: Intravenous  PONV Risk Score and Plan: Treatment may vary due to age or medical condition  Airway Management Planned: Oral ETT and Video Laryngoscope Planned  Additional Equipment: Arterial line  Intra-op Plan:   Post-operative Plan: Extubation in OR and Possible Post-op intubation/ventilation  Informed Consent: I have reviewed the patients History and Physical, chart, labs and discussed the procedure including the risks, benefits and alternatives for the proposed anesthesia with the patient or authorized representative who has indicated his/her understanding and acceptance.     Dental advisory given and Consent reviewed  with POA  Plan Discussed with: CRNA, Anesthesiologist and Surgeon  Anesthesia Plan Comments: (History and consent via the  patients wife  Patient consented for risks of anesthesia including but not limited to:  - adverse reactions to medications - damage to eyes, teeth, lips or other oral mucosa - nerve damage due to positioning  - sore throat or hoarseness - Damage to heart, brain, nerves, lungs, other parts of body or loss of life  Patient voiced understanding.)      Anesthesia Quick Evaluation

## 2020-12-01 NOTE — Consult Note (Addendum)
NAME:  Caleb Fisher, MRN:  [id number], DOB:  1975-02-28, LOS: 1 ADMISSION DATE:  11/30/2020, CONSULTATION DATE:  12/01/2020 REFERRING MD:  Doristine Mango MD, CHIEF COMPLAINT:  Spinal Abscess, MRSA bacteremia   History of Present Illness:  45 year old smoker with no significant past medical history presenting with altered mental status, neck pain, frequent falls and sepsis.  He was started on broad antibiotic coverage, attempted LP in the ED and IR which was unsuccessful CT imaging with no acute process.  He then had a MRI spine showing epidural cervical abscess around C2-C5 Neurosurgery consulted and he was taken to the OR however labs in the PACU revealed thrombocytopenia and hypokalemia hence surgery was canceled and he was transferred to ICU for further stabilization and correction of potassium and platelets.  Pertinent  Medical History   History reviewed. No pertinent past medical history.   Significant Hospital Events: Including procedures, antibiotic start and stop dates in addition to other pertinent events   11/16 Admit  Interim History / Subjective:     Objective   Blood pressure (!) 179/114, pulse (!) 109, temperature 98.9 F (37.2 C), resp. rate (!) 30, height 5\' 9"  (1.753 m), weight 73.4 kg, SpO2 94 %.         Intake/Output Summary (Last 24 hours) at 12/01/2020 0326 Last data filed at 12/01/2020 0219 Gross per 24 hour  Intake 2238.09 ml  Output 1900 ml  Net 338.09 ml   Filed Weights   11/30/20 1311 11/30/20 2216  Weight: 72.6 kg 73.4 kg    Examination: Gen:      No acute distress HEENT:  EOMI, sclera anicteric Neck:     No masses; no thyromegaly Lungs:    Clear to auscultation bilaterally; normal respiratory effort CV:         Regular rate and rhythm; no murmurs Abd:      + bowel sounds; soft, non-tender; no palpable masses, no distension Ext:    No edema; adequate peripheral perfusion Skin:      Warm and dry; no rash Neuro: Somnolent, arousable.   Oriented x2.  Moves all extremities with no gross focal deficits  Resolved Hospital Problem list     Assessment & Plan:  MRSA bacteremia, epidural abscess Appreciate input for Dr. Lacinda Axon, Neurosurgery Plan is to take him back to the OR after correction of platelets and potassium  Sepsis, present on admission MRSA bacteremia Lactic acidosis improved Continue IV fluid hydration Vanco, Flagyl to continue per ID Echocardiogram has been ordered to look for vegetations  Thrombocytopenia, elevated INR Likely from sepsis.  Transfuse platelets for goal of greater than 100 for surgery Hematology consult placed for a.m  Hypokalemia Potassium repletion ordered  AKI Urinary retention CT imaging suggest that he passed a stone which may be the initial source of bacteremia Continue Foley Monitor labs  Elevated transaminases, troponin due to sepsis Likely due to sepsis, demand ischemia Follow labs  Hypertension Hydralazine as needed  Wife updated at bedside  Best Practice (right click and "Reselect all SmartList Selections" daily)   Diet/type: NPO DVT prophylaxis: SCD GI prophylaxis: N/A Lines: N/A Foley:  Yes, and it is still needed Code Status:  full code Last date of multidisciplinary goals of care discussion []   Labs   CBC: Recent Labs  Lab 11/30/20 1310 12/01/20 0030 12/01/20 0147 12/01/20 0157  WBC 14.2* 8.8  --  13.0*  NEUTROABS 12.8*  --   --   --   HGB 12.8* 9.8*  --  11.3*  HCT 36.6* 28.1*  --  32.6*  MCV 85.1 85.9  --  83.8  PLT 125* NO PLT 77* 80*    Basic Metabolic Panel: Recent Labs  Lab 11/30/20 1310  NA 135  K 3.0*  CL 99  CO2 23  GLUCOSE 117*  BUN 54*  CREATININE 1.58*  CALCIUM 8.4*  MG 2.4   GFR: Estimated Creatinine Clearance: 58.4 mL/min (A) (by C-G formula based on SCr of 1.58 mg/dL (H)). Recent Labs  Lab 11/30/20 1310 11/30/20 1643 11/30/20 2237 12/01/20 0030 12/01/20 0147 12/01/20 0157  PROCALCITON 17.28  --   --   --   --   22.89  WBC 14.2*  --   --  8.8  --  13.0*  LATICACIDVEN 2.0* 2.2* 1.7  --  1.9  --     Liver Function Tests: Recent Labs  Lab 11/30/20 1310  AST 102*  ALT 55*  ALKPHOS 166*  BILITOT 1.1  PROT 6.5  ALBUMIN 2.9*   No results for input(s): LIPASE, AMYLASE in the last 168 hours. Recent Labs  Lab 11/30/20 1454  AMMONIA 14    ABG    Component Value Date/Time   HCO3 24.8 11/30/2020 1431   O2SAT 52.8 11/30/2020 1431     Coagulation Profile: Recent Labs  Lab 11/30/20 1310 12/01/20 0147  INR 1.2 1.3*    Cardiac Enzymes: No results for input(s): CKTOTAL, CKMB, CKMBINDEX, TROPONINI in the last 168 hours.  HbA1C: No results found for: HGBA1C  CBG: No results for input(s): GLUCAP in the last 168 hours.  Review of Systems:   Unable to obtain  Past Medical History:  He,  has no past medical history on file.   Surgical History:  History reviewed. No pertinent surgical history.   Social History:   reports current alcohol use.   Family History:  His family history is not on file.   Allergies No Known Allergies   Home Medications  Prior to Admission medications   Medication Sig Start Date End Date Taking? Authorizing Provider  Aspirin-Salicylamide-Caffeine (BC HEADACHE POWDER PO) Take by mouth.   Yes [provider]  ibuprofen (ADVIL) 800 MG tablet Take 800 mg by mouth every 8 (eight) hours as needed.   Yes [provider]     Critical care time:    The patient is critically ill with multiple organ system failure and requires high complexity decision making for assessment and support, frequent evaluation and titration of therapies, advanced monitoring, review of radiographic studies and interpretation of complex data.   Critical Care Time devoted to patient care services, exclusive of separately billable procedures, described in this note is 45 minutes.   Marshell Garfinkel MD [facility] See Amion for pager  If no  response to pager , please call [phone] until 7pm After 7:00 pm call Elink  [phone] 12/01/2020, 4:24 AM

## 2020-12-01 NOTE — H&P (View-Only) (Signed)
Neurosurgery-New Consultation Evaluation 12/01/2020 Caleb Fisher [id number]  Identifying Statement: Caleb Fisher is a 45 y.o. male from [place] [id number] with   Physician Requesting Consultation: Rufina Falco, hospitalist  History of Present Illness: Caleb Fisher is here for evaluation of altered mental status but was also endorsing recent falls and neck pain.  He was noted by his wife to be confused for the past couple of days and on arrival there was concern for sepsis.  A lumbar puncture was attempted but unable to be obtained but blood cultures were sent and the patient was started on antibiotics.  Given the sepsis concern, imaging of the body was obtained first with CT which was unrevealing but then with an MRI of the total spine which did reveal concern for epidural abscess of the cervical spine.  We are consulted to evaluate and treat.  Past Medical History:  History reviewed. No pertinent past medical history.  Social History: Social History   Socioeconomic History   Marital status: Married    Spouse name: Not on file   Number of children: Not on file   Years of education: Not on file   Highest education level: Not on file  Occupational History   Not on file  Tobacco Use   Smoking status: Not on file   Smokeless tobacco: Not on file  Substance and Sexual Activity   Alcohol use: Yes   Drug use: Not on file   Sexual activity: Not on file  Other Topics Concern   Not on file  Social History Narrative   Not on file   Social Determinants of Health   Financial Resource Strain: Not on file  Food Insecurity: Not on file  Transportation Needs: Not on file  Physical Activity: Not on file  Stress: Not on file  Social Connections: Not on file  Intimate Partner Violence: Not on file    Family History: No family history on file.  Review of Systems:  Review of Systems -review of systems unable to be obtained given patient's altered mental status  Physical Exam: BP (!)  151/99 (BP Location: Left Arm)   Pulse (!) 112   Temp 98.4 F (36.9 C) (Oral)   Resp (!) 31   Ht 5\' 9"  (1.753 m)   Wt 73.4 kg   SpO2 96%   BMI 23.90 kg/m  Body mass index is 23.9 kg/m. Body surface area is 1.89 meters squared. General appearance: Arouses to voice Head: Normocephalic, atraumatic Ext: Extremities are warm  Neurologic exam:  Mental status: alertness: Arouses to voice, oriented to name but stated wrong hospital Cranial nerves:  Eyes open to voice, able to count fingers Face appears to be symmetric Hearing is grossly intact Motor:strength is 5 out of 5 in bilateral biceps, triceps, grip.  He has 5-5 strength in bilateral hip flexion, dorsiflexion of the feet.  Follows commands in all extremities Sensory: intact to light touch in all extremities Gait: Not tested  Laboratory: Results for orders placed or performed during the hospital encounter of 11/30/20  Blood Culture (routine x 2)   Specimen: BLOOD  Result Value Ref Range   Specimen Description BLOOD LEFT ANTECUBITAL    Special Requests      BOTTLES DRAWN AEROBIC AND ANAEROBIC Blood Culture results may not be optimal due to an excessive volume of blood received in culture bottles   Culture  Setup Time      Organism ID to follow GRAM POSITIVE COCCI AEROBIC BOTTLE ONLY CRITICAL RESULT CALLED TO, READ  BACK BY AND VERIFIED WITH: Performed at [facility], [address] [place], [place] [address]    Culture GRAM POSITIVE COCCI    Report Status PENDING   Resp Panel by RT-PCR (Flu A&B, Covid) Nasopharyngeal Swab   Specimen: Nasopharyngeal Swab; Nasopharyngeal(NP) swabs in vial transport medium  Result Value Ref Range   SARS Coronavirus 2 by RT PCR NEGATIVE NEGATIVE   Influenza A by PCR NEGATIVE NEGATIVE   Influenza B by PCR NEGATIVE NEGATIVE  Comprehensive metabolic panel  Result Value Ref Range   Sodium 135 135 - 145 mmol/L   Potassium 3.0 (L) 3.5 - 5.1 mmol/L   Chloride 99 98 - 111 mmol/L   CO2  23 22 - 32 mmol/L   Glucose, Bld 117 (H) 70 - 99 mg/dL   BUN 54 (H) 6 - 20 mg/dL   Creatinine, Ser 1.58 (H) 0.61 - 1.24 mg/dL   Calcium 8.4 (L) 8.9 - 10.3 mg/dL   Total Protein 6.5 6.5 - 8.1 g/dL   Albumin 2.9 (L) 3.5 - 5.0 g/dL   AST 102 (H) 15 - 41 U/L   ALT 55 (H) 0 - 44 U/L   Alkaline Phosphatase 166 (H) 38 - 126 U/L   Total Bilirubin 1.1 0.3 - 1.2 mg/dL   GFR, Estimated 54 (L) >60 mL/min   Anion gap 13 5 - 15  Ethanol  Result Value Ref Range   Alcohol, Ethyl (B) <10 <10 mg/dL  CBC with Differential  Result Value Ref Range   WBC 14.2 (H) 4.0 - 10.5 K/uL   RBC 4.30 4.22 - 5.81 MIL/uL   Hemoglobin 12.8 (L) 13.0 - 17.0 g/dL   HCT 36.6 (L) 39.0 - 52.0 %   MCV 85.1 80.0 - 100.0 fL   MCH 29.8 26.0 - 34.0 pg   MCHC 35.0 30.0 - 36.0 g/dL   RDW 14.9 11.5 - 15.5 %   Platelets 125 (L) 150 - 400 K/uL   nRBC 0.0 0.0 - 0.2 %   Neutrophils Relative % 90 %   Neutro Abs 12.8 (H) 1.7 - 7.7 K/uL   Lymphocytes Relative 4 %   Lymphs Abs 0.6 (L) 0.7 - 4.0 K/uL   Monocytes Relative 4 %   Monocytes Absolute 0.6 0.1 - 1.0 K/uL   Eosinophils Relative 0 %   Eosinophils Absolute 0.0 0.0 - 0.5 K/uL   Basophils Relative 1 %   Basophils Absolute 0.1 0.0 - 0.1 K/uL   WBC Morphology Abnormal lymphocytes present    RBC Morphology MIXED RBC POPULATION    Smear Review PLATELETS APPEAR ADEQUATE    Immature Granulocytes 1 %   Abs Immature Granulocytes 0.13 (H) 0.00 - 0.07 K/uL  Urine Drug Screen, Qualitative  Result Value Ref Range   Tricyclic, Ur Screen NONE DETECTED NONE DETECTED   Amphetamines, Ur Screen NONE DETECTED NONE DETECTED   MDMA (Ecstasy)Ur Screen NONE DETECTED NONE DETECTED   Cocaine Metabolite,Ur [place] NONE DETECTED NONE DETECTED   Opiate, Ur Screen POSITIVE (A) NONE DETECTED   Phencyclidine (PCP) Ur S NONE DETECTED NONE DETECTED   Cannabinoid 50 Ng, Ur [place] NONE DETECTED NONE DETECTED   Barbiturates, Ur Screen NONE DETECTED NONE DETECTED   Benzodiazepine, Ur Scrn NONE DETECTED NONE  DETECTED   Methadone Scn, Ur NONE DETECTED NONE DETECTED  Salicylate level  Result Value Ref Range   Salicylate Lvl 18.2 7.0 - 30.0 mg/dL  Lactic acid, plasma  Result Value Ref Range   Lactic Acid, Venous 2.0 (HH) 0.5 -  1.9 mmol/L  Lactic acid, plasma  Result Value Ref Range   Lactic Acid, Venous 2.2 (HH) 0.5 - 1.9 mmol/L  Magnesium  Result Value Ref Range   Magnesium 2.4 1.7 - 2.4 mg/dL  TSH  Result Value Ref Range   TSH 0.267 (L) 0.350 - 4.500 uIU/mL  Procalcitonin - Baseline  Result Value Ref Range   Procalcitonin 17.28 ng/mL  Protime-INR  Result Value Ref Range   Prothrombin Time 15.6 (H) 11.4 - 15.2 seconds   INR 1.2 0.8 - 1.2  APTT  Result Value Ref Range   aPTT 39 (H) 24 - 36 seconds  Urinalysis, Complete w Microscopic Urine, Clean Catch  Result Value Ref Range   Color, Urine AMBER (A) YELLOW   APPearance HAZY (A) CLEAR   Specific Gravity, Urine 1.024 1.005 - 1.030   pH 5.0 5.0 - 8.0   Glucose, UA NEGATIVE NEGATIVE mg/dL   Hgb urine dipstick MODERATE (A) NEGATIVE   Bilirubin Urine NEGATIVE NEGATIVE   Ketones, ur NEGATIVE NEGATIVE mg/dL   Protein, ur 100 (A) NEGATIVE mg/dL   Nitrite NEGATIVE NEGATIVE   Leukocytes,Ua NEGATIVE NEGATIVE   RBC / HPF 21-50 0 - 5 RBC/hpf   WBC, UA 6-10 0 - 5 WBC/hpf   Bacteria, UA NONE SEEN NONE SEEN   Squamous Epithelial / LPF 0-5 0 - 5   Mucus PRESENT    Hyaline Casts, UA PRESENT   Ammonia  Result Value Ref Range   Ammonia 14 9 - 35 umol/L  Blood gas, venous  Result Value Ref Range   pH, Ven 7.40 7.250 - 7.430   pCO2, Ven 40 (L) 44.0 - 60.0 mmHg   pO2, Ven PENDING 32.0 - 45.0 mmHg   Bicarbonate 24.8 20.0 - 28.0 mmol/L   Acid-Base Excess 0.0 0.0 - 2.0 mmol/L   O2 Saturation 52.8 %   Patient temperature 37.0    Collection site VENOUS    Sample type VENOUS   Acetaminophen level  Result Value Ref Range   Acetaminophen (Tylenol), Serum <10 (L) 10 - 30 ug/mL  Lactic acid, plasma  Result Value Ref Range   Lactic Acid,  Venous 1.7 0.5 - 1.9 mmol/L  Troponin I (High Sensitivity)  Result Value Ref Range   Troponin I (High Sensitivity) 54 (H) <18 ng/L  Troponin I (High Sensitivity)  Result Value Ref Range   Troponin I (High Sensitivity) 75 (H) <18 ng/L  Troponin I (High Sensitivity)  Result Value Ref Range   Troponin I (High Sensitivity) 193 (HH) <18 ng/L   I personally reviewed labs  Imaging: Results for orders placed during the hospital encounter of 11/30/20  MR BRAIN W WO CONTRAST  Addendum 11/30/2020 10:11 PM ADDENDUM REPORT: 11/30/2020 22:08  ADDENDUM: These results were called by telephone at the time of interpretation on 11/30/2020 at 10:08 pm to provider [facility] , who verbally acknowledged these results.   Electronically Signed By: Ulyses Jarred M.D. On: 11/30/2020 22:08  Narrative CLINICAL DATA:  Neck pain and altered mental status  EXAM: MRI HEAD WITHOUT AND WITH CONTRAST  MRI CERVICAL SPINE WITHOUT AND WITH CONTRAST  TECHNIQUE: Multiplanar, multiecho pulse sequences of the brain and surrounding structures, and cervical spine, to include the craniocervical junction and cervicothoracic junction, were obtained without and with intravenous contrast.  CONTRAST:  7 mL Gadavist  COMPARISON:  None.  FINDINGS: MRI HEAD FINDINGS  Brain: No acute infarct, mass effect or extra-axial collection. No acute or chronic hemorrhage. There is multifocal hyperintense T2-weighted  signal within the white matter. Parenchymal volume and CSF spaces are normal. 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 The midline structures are normal.  Vascular: Major flow voids are preserved.  Skull and upper cervical spine: Normal calvarium and skull base. Visualized upper cervical spine and soft tissues are normal.  Sinuses/Orbits:No paranasal sinus fluid levels or advanced mucosal thickening. No mastoid or middle ear effusion. Normal orbits.  MRI CERVICAL SPINE FINDINGS  Alignment:  Physiologic.  Vertebrae: No fracture, evidence of discitis, or bone lesion.  Cord: Spinal cord itself is normal, but there is a circumferential collection the length the cervical spine with peripheral contrast enhancement. The thickness of the collection is greatest at the C2 and C3 levels, measuring up to 6 mm in the 7 o'clock position of the spinal canal.  Posterior Fossa, vertebral arteries, paraspinal tissues: Small prevertebral effusion from C2-C5, measuring 7 mm in thickness.  Disc levels:  There is mild multilevel degenerative disc disease, greatest at C5-6. There is no bony spinal canal stenosis. The CSF space is effaced along the entire length of the cervical spine due to the above described epidural collection.  IMPRESSION: 1. 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 2. No spinal cord signal change. 3. Small prevertebral effusion from C2-C5. 4. No acute intracranial abnormality. 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  Electronically Signed: By: Ulyses Jarred M.D. On: 11/30/2020 21:51  I personally reviewed radiology studies to include:   Impression/Plan:  Mr. Glennon is here for evaluation of sepsis and found to have epidural abscess in the cervical spine.  Given this finding, I did discuss with the wife that we could proceed with a decompression or neuro to gain more specimen to guide antibiotic therapy as well as decompress the spinal cord.  His exam at this time appears intact but we did discuss that with the infection, he could have a decline without a decompression.  She expressed understanding and would like to proceed with the surgery.  We discussed the C2-6 laminectomy and decompression.  We discussed the risk of hematoma, infection, damage to the spinal cord, risk of anesthesia.   1.   Diagnosis: Cervical epidural abscess  2.  Plan -Proceed with cervical laminectomy and decompression

## 2020-12-01 NOTE — Anesthesia Postprocedure Evaluation (Signed)
Anesthesia Post Note  Patient: Caleb Fisher  Procedure(s) Performed: POSTERIOR CERVICAL LAMINECTOMY FOR EPIDURAL ABSCESS C2-C4 (Neck)  Patient location during evaluation: SICU Anesthesia Type: General Level of consciousness: sedated Pain management: pain level controlled Vital Signs Assessment: post-procedure vital signs reviewed and stable Respiratory status: patient remains intubated per anesthesia plan Cardiovascular status: stable Postop Assessment: no apparent nausea or vomiting Anesthetic complications: no   No notable events documented.   Last Vitals:  Vitals:   12/01/20 2100 12/01/20 2200  BP: 119/74 114/76  Pulse: (!) 106 95  Resp: 17 18  Temp:    SpO2: 95% 96%    Last Pain:  Vitals:   12/01/20 1900  TempSrc: Oral  PainSc:                  Precious Haws Ormand Senn

## 2020-12-01 NOTE — Consult Note (Addendum)
NAME: Caleb Fisher  DOB: 1975-08-03  MRN: [id number]  Date/Time: 12/01/2020 11:08 AM  REQUESTING PROVIDER: Stark Klein Subjective:  REASON FOR CONSULT: MRSA bacteremia ?No history available from patient because of his mental status Wife at bed side- history from her Chart reviewed Caleb Fisher is a 45 y.o. male who is not engaged in medical care, uses BC powder, advil, aleve every day  and sometimes percocet for back pain and body pain, presents with severe neck pain, altered mental status, fall and fever As per his wife pt has been ill since saturday, he has had severe neck pain , inability to move his neck, some confusion- On Saturday he was at work ( she works in the same place as well) and she was called to pick him up as he was not doing well-  On Sunday patient was in bed and was rolling out of it just to get himself up- she does not think he fell As he was getting worse she brought him to the ED   Apparently 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 As per wife he has been forgetful and missing things for a few months to a year- she does not have an explanation for this- thinks it could be dementia? She says he seldom drinks, does not do any illegal substance, no IVDA He gets percocet from outside the medical system Wife says he picks on his skin as he feels small bumps on his forearms and has some wounds-  Wife says many years ago he fell off from height and hurt his sacrum  In the ED temp went up to 104.4, BP 151/99. Temp 98.4, pulse rate 112 and RR 31 and sats 96% Wbc was 14.2, Hb 12.8, cr 1.58, plt 125   History reviewed. No pertinent past medical history.  History reviewed. No pertinent surgical history.  Social History   Socioeconomic History   Marital status: Married    Spouse name: Not on file   Number of children: Not on file   Years of education: Not on file   Highest education level: Not on file   Occupational History   Not on file  Tobacco Use   Smoking status: Not on file   Smokeless tobacco: Not on file  Substance and Sexual Activity   Alcohol use: Yes   Drug use: Not on file   Sexual activity: Not on file  Other Topics Concern   Not on file  Social History Narrative   Not on file   Social Determinants of Health   Financial Resource Strain: Not on file  Food Insecurity: Not on file  Transportation Needs: Not on file  Physical Activity: Not on file  Stress: Not on file  Social Connections: Not on file  Intimate Partner Violence: Not on file  FH Wife says all his male members of his family died young Dad died of sepsis/kidney issues in 2016  No Known Allergies I? Current Facility-Administered Medications  Medication Dose Route Frequency Provider Last Rate Last Admin   acetaminophen (TYLENOL) tablet 650 mg  650 mg Oral Q6H PRN Richarda Osmond, MD       Or   acetaminophen (TYLENOL) suppository 650 mg  650 mg Rectal Q6H PRN Richarda Osmond, MD   650 mg at 11/30/20 2152   Chlorhexidine Gluconate Cloth 2 % PADS 6 each  6 each Topical Q0600 Richarda Osmond, MD  6 each at 12/01/20 0427   enoxaparin (LOVENOX) injection 40 mg  40 mg Subcutaneous Q24H Richarda Osmond, MD   40 mg at 11/30/20 1842   hydrALAZINE (APRESOLINE) 20 MG/ML injection            hydrALAZINE (APRESOLINE) injection 10-40 mg  10-40 mg Intravenous Q4H PRN Simonne Maffucci B, MD   40 mg at 12/01/20 0757   labetalol (NORMODYNE) injection 10-20 mg  10-20 mg Intravenous Q4H PRN Simonne Maffucci B, MD   20 mg at 12/01/20 0835   LORazepam (ATIVAN) tablet 1 mg  1 mg Oral Q6H PRN Richarda Osmond, MD       Or   LORazepam (ATIVAN) injection 1 mg  1 mg Intramuscular Q6H PRN Richarda Osmond, MD   1 mg at 11/30/20 2030   MEDLINE mouth rinse  15 mL Mouth Rinse BID Simonne Maffucci B, MD   15 mL at 12/01/20 1017   metroNIDAZOLE (FLAGYL) IVPB 500 mg  500 mg Intravenous Q6H Tsosie Billing,  MD   Stopped at 12/01/20 0705   morphine 2 MG/ML injection 1-2 mg  1-2 mg Intravenous Q4H PRN Darel Hong D, NP   1 mg at 12/01/20 0855   morphine 2 MG/ML injection            nicardipine (CARDENE) 20mg  in 0.86% saline 273ml IV infusion (0.1 mg/ml)  3-15 mg/hr Intravenous Continuous Darel Hong D, NP 30 mL/hr at 12/01/20 0944 3 mg/hr at 12/01/20 0944   ondansetron (ZOFRAN) tablet 4 mg  4 mg Oral Q6H PRN Richarda Osmond, MD       Or   ondansetron [facility]) injection 4 mg  4 mg Intravenous Q6H PRN Richarda Osmond, MD       oxyCODONE (Oxy IR/ROXICODONE) immediate release tablet 5 mg  5 mg Oral Q4H PRN Richarda Osmond, MD       vancomycin (VANCOREADY) IVPB 1000 mg/200 mL  1,000 mg Intravenous Q12H Renda Rolls, RPH 200 mL/hr at 12/01/20 0817 Infusion Verify at 12/01/20 0817     Abtx:  Anti-infectives (From admission, onward)    Start     Dose/Rate Route Frequency Ordered Stop   12/01/20 0800  cefTRIAXone (ROCEPHIN) 2 g in sodium chloride 0.9 % 100 mL IVPB  Status:  Discontinued        2 g 200 mL/hr over 30 Minutes Intravenous Every 24 hours 11/30/20 2311 12/01/20 0032   12/01/20 0700  vancomycin (VANCOREADY) IVPB 1000 mg/200 mL        1,000 mg 200 mL/hr over 60 Minutes Intravenous Every 12 hours 11/30/20 2319     12/01/20 0700  ceFEPIme (MAXIPIME) 2 g in sodium chloride 0.9 % 100 mL IVPB  Status:  Discontinued        2 g 200 mL/hr over 30 Minutes Intravenous Every 8 hours 12/01/20 0054 12/01/20 0128   12/01/20 0618  metroNIDAZOLE (FLAGYL) IVPB 500 mg        500 mg 100 mL/hr over 60 Minutes Intravenous Every 6 hours 12/01/20 0031     12/01/20 0600  cefTRIAXone (ROCEPHIN) 2 g in sodium chloride 0.9 % 100 mL IVPB  Status:  Discontinued        2 g 200 mL/hr over 30 Minutes Intravenous Every 12 hours 12/01/20 0035 12/01/20 0049   12/01/20 0045  cefTRIAXone (ROCEPHIN) 2 g in sodium chloride 0.9 % 100 mL IVPB  Status:  Discontinued        2 g 200  mL/hr over 30 Minutes  Intravenous Every 12 hours 12/01/20 0032 12/01/20 0035   12/01/20 0000  metroNIDAZOLE (FLAGYL) IVPB 500 mg  Status:  Discontinued        500 mg 100 mL/hr over 60 Minutes Intravenous Every 12 hours 11/30/20 2311 12/01/20 0031   11/30/20 2300  ceFEPIme (MAXIPIME) 2 g in sodium chloride 0.9 % 100 mL IVPB  Status:  Discontinued        2 g 200 mL/hr over 30 Minutes Intravenous Every 8 hours 11/30/20 1816 12/01/20 0000   11/30/20 1930  acyclovir (ZOVIRAX) 725 mg in dextrose 5 % 150 mL IVPB  Status:  Discontinued        10 mg/kg  72.6 kg 164.5 mL/hr over 60 Minutes Intravenous Every 8 hours 11/30/20 1915 12/01/20 0032   11/30/20 1500  vancomycin (VANCOREADY) IVPB 1750 mg/350 mL        1,750 mg 175 mL/hr over 120 Minutes Intravenous  Once 11/30/20 1443 11/30/20 2200   11/30/20 1445  ceFEPIme (MAXIPIME) 2 g in sodium chloride 0.9 % 100 mL IVPB        2 g 200 mL/hr over 30 Minutes Intravenous  Once 11/30/20 1436 11/30/20 1728   11/30/20 1445  metroNIDAZOLE (FLAGYL) IVPB 500 mg        500 mg 100 mL/hr over 60 Minutes Intravenous  Once 11/30/20 1436 11/30/20 1835   11/30/20 1445  vancomycin (VANCOCIN) IVPB 1000 mg/200 mL premix  Status:  Discontinued        1,000 mg 200 mL/hr over 60 Minutes Intravenous  Once 11/30/20 1436 11/30/20 1443       REVIEW OF SYSTEMS:  NA Objective:  VITALS:  BP (!) 144/99   Pulse (!) 116   Temp 98.4 F (36.9 C) (Oral)   Resp (!) 24   Ht 5\' 9"  (1.753 m)   Wt 73.4 kg   SpO2 90%   BMI 23.90 kg/m  PHYSICAL EXAM:  General: obtunded Mumbles when his name is called Was able to say his wife's name Gets agitated when questions are persistent Head: Normocephalic, without obvious abnormality, atraumatic. Eyes: Conjunctivae clear, anicteric sclerae. Pupils are equal ENT cannot assess Neck: Supple, symmetrical, no adenopathy, thyroid: non tender no carotid bruit and no JVD. Back: did not examine Lungs:b/l air entry Heart: Tachycardia Abdomen: Soft,  non-tender,not distended. Bowel sounds normal. No masses Extremities: atraumatic, no cyanosis. No edema. No clubbing Skin: excoriations over forearms Lymph: Cervical, supraclavicular normal. Neurologic: able to move all his limbs Pertinent Labs Lab Results CBC    Component Value Date/Time   WBC 13.0 (H) 12/01/2020 0157   RBC 3.89 (L) 12/01/2020 0157   HGB 11.3 (L) 12/01/2020 0157   HCT 32.6 (L) 12/01/2020 0157   PLT 80 (L) 12/01/2020 0157   MCV 83.8 12/01/2020 0157   MCH 29.0 12/01/2020 0157   MCHC 34.7 12/01/2020 0157   RDW 14.9 12/01/2020 0157   LYMPHSABS 0.6 (L) 11/30/2020 1310   MONOABS 0.6 11/30/2020 1310   EOSABS 0.0 11/30/2020 1310   BASOSABS 0.1 11/30/2020 1310    CMP Latest Ref Rng & Units 12/01/2020 11/30/2020  Glucose 70 - 99 mg/dL 86 117(H)  BUN 6 - 20 mg/dL 38(H) 54(H)  Creatinine 0.61 - 1.24 mg/dL 1.15 1.58(H)  Sodium 135 - 145 mmol/L 137 135  Potassium 3.5 - 5.1 mmol/L 2.4(LL) 3.0(L)  Chloride 98 - 111 mmol/L 103 99  CO2 22 - 32 mmol/L 23 23  Calcium 8.9 - 10.3 mg/dL 8.2(L) 8.4(L)  Total Protein 6.5 - 8.1 g/dL 5.3(L) 6.5  Total Bilirubin 0.3 - 1.2 mg/dL 1.5(H) 1.1  Alkaline Phos 38 - 126 U/L 129(H) 166(H)  AST 15 - 41 U/L 97(H) 102(H)  ALT 0 - 44 U/L 49(H) 55(H)      Microbiology: Recent Results (from the past 240 hour(s))  Blood Culture (routine x 2)     Status: None (Preliminary result)   Collection Time: 11/30/20  2:54 PM   Specimen: BLOOD  Result Value Ref Range Status   Specimen Description   Final    BLOOD LEFT ANTECUBITAL Performed at [facility], [address] [place], [place] [address]    Special Requests   Final    BOTTLES DRAWN AEROBIC AND ANAEROBIC Blood Culture results may not be optimal due to an excessive volume of blood received in culture bottles Performed at [facility], [address] [place], [place] [address]    Culture  Setup Time   Final    Organism ID to follow [place] CRITICAL RESULT CALLED TO, READ BACK BY AND VERIFIED WITH: NATHAN BELUE @0011  ON 12/01/20 SKL    Culture GRAM POSITIVE COCCI  Final   Report Status PENDING  Incomplete  Blood Culture (routine x 2)     Status: None (Preliminary result)   Collection Time: 11/30/20  2:54 PM   Specimen: BLOOD  Result Value Ref Range Status   Specimen Description   Final    BLOOD BLOOD RIGHT HAND Performed at [facility], [address] [place], [place] [address]    Special Requests   Final    BOTTLES DRAWN AEROBIC AND ANAEROBIC Blood Culture adequate volume Performed at [facility], [address] [place],  [address]    Culture  Setup Time   Final    GRAM POSITIVE COCCI IN BOTH AEROBIC AND ANAEROBIC BOTTLES CRITICAL VALUE NOTED.  VALUE IS CONSISTENT WITH PREVIOUSLY REPORTED AND CALLED VALUE.    Culture GRAM POSITIVE COCCI  Final   Report Status PENDING  Incomplete  Resp Panel by RT-PCR (Flu A&B, Covid) Nasopharyngeal Swab     Status: None   Collection Time: 11/30/20  2:54 PM   Specimen: Nasopharyngeal Swab; Nasopharyngeal(NP) swabs in vial transport medium  Result Value Ref Range Status   SARS Coronavirus 2 by RT PCR NEGATIVE NEGATIVE Final    Comment: (NOTE) SARS-CoV-2 target nucleic acids are NOT DETECTED.  The SARS-CoV-2 RNA is generally detectable in upper respiratory specimens during the acute phase of infection. The lowest concentration of SARS-CoV-2 viral copies this assay can detect is 138 copies/mL. A negative result does not preclude SARS-Cov-2 infection and should not be used as the sole basis for treatment or other patient management decisions. A negative result may occur with  improper specimen collection/handling, submission of specimen other than nasopharyngeal swab, presence of viral mutation(s) within the areas targeted by this assay, and inadequate number of viral copies(<138 copies/mL). A negative result must be  combined with clinical observations, patient history, and epidemiological information. The expected result is Negative.  Fact Sheet for Patients:  EntrepreneurPulse.com.au  Fact Sheet for Healthcare Providers:  IncredibleEmployment.be  This test is no t yet approved or cleared by the [place] FDA and  has been authorized for detection and/or diagnosis of SARS-CoV-2 by FDA under an Emergency Use Authorization (EUA). This EUA will remain  in effect (meaning this test can be used) for the duration of the COVID-19  declaration under Section 564(b)(1) of the Act, 21 U.S.C.section 360bbb-3(b)(1), unless the authorization is terminated  or revoked sooner.       Influenza A by PCR NEGATIVE NEGATIVE Final   Influenza B by PCR NEGATIVE NEGATIVE Final    Comment: (NOTE) The Xpert Xpress SARS-CoV-2/FLU/RSV plus assay is intended as an aid in the diagnosis of influenza from Nasopharyngeal swab specimens and should not be used as a sole basis for treatment. Nasal washings and aspirates are unacceptable for Xpert Xpress SARS-CoV-2/FLU/RSV testing.  Fact Sheet for Patients: EntrepreneurPulse.com.au  Fact Sheet for Healthcare Providers: IncredibleEmployment.be  This test is not yet approved or cleared by the [place] FDA and has been authorized for detection and/or diagnosis of SARS-CoV-2 by FDA under an Emergency Use Authorization (EUA). This EUA will remain in effect (meaning this test can be used) for the duration of the COVID-19 declaration under Section 564(b)(1) of the Act, 21 U.S.C. section 360bbb-3(b)(1), unless the authorization is terminated or revoked.  Performed at [facility], [address] [place], [place] [address]   Blood Culture ID Panel (Reflexed)     Status: Abnormal   Collection Time: 11/30/20  2:54 PM  Result Value Ref Range Status   Enterococcus faecalis NOT DETECTED NOT  DETECTED Final   Enterococcus Faecium NOT DETECTED NOT DETECTED Final   Listeria monocytogenes NOT DETECTED NOT DETECTED Final   Staphylococcus species DETECTED (A) NOT DETECTED Final    Comment: CRITICAL RESULT CALLED TO, READ BACK BY AND VERIFIED WITH: NATHAN BELUE @0011  ON 12/01/20 SKL    Staphylococcus aureus (BCID) DETECTED (A) NOT DETECTED Final    Comment: Methicillin (oxacillin)-resistant Staphylococcus aureus (MRSA). MRSA is predictably resistant to beta-lactam antibiotics (except ceftaroline). Preferred therapy is vancomycin unless clinically contraindicated. Patient requires contact precautions if  hospitalized. CRITICAL RESULT CALLED TO, READ BACK BY AND VERIFIED WITH: NATHAN BELUE @0011  ON 12/01/20 SKL    Staphylococcus epidermidis NOT DETECTED NOT DETECTED Final   Staphylococcus lugdunensis NOT DETECTED NOT DETECTED Final   Streptococcus species NOT DETECTED NOT DETECTED Final   Streptococcus agalactiae NOT DETECTED NOT DETECTED Final   Streptococcus pneumoniae NOT DETECTED NOT DETECTED Final   Streptococcus pyogenes NOT DETECTED NOT DETECTED Final   A.calcoaceticus-baumannii NOT DETECTED NOT DETECTED Final   Bacteroides fragilis NOT DETECTED NOT DETECTED Final   Enterobacterales NOT DETECTED NOT DETECTED Final   Enterobacter cloacae complex NOT DETECTED NOT DETECTED Final   Escherichia coli NOT DETECTED NOT DETECTED Final   Klebsiella aerogenes NOT DETECTED NOT DETECTED Final   Klebsiella oxytoca NOT DETECTED NOT DETECTED Final   Klebsiella pneumoniae NOT DETECTED NOT DETECTED Final   Proteus species NOT DETECTED NOT DETECTED Final   Salmonella species NOT DETECTED NOT DETECTED Final   Serratia marcescens NOT DETECTED NOT DETECTED Final   Haemophilus influenzae NOT DETECTED NOT DETECTED Final   Neisseria meningitidis NOT DETECTED NOT DETECTED Final   Pseudomonas aeruginosa NOT DETECTED NOT DETECTED Final   Stenotrophomonas maltophilia NOT DETECTED NOT DETECTED Final    Candida albicans NOT DETECTED NOT DETECTED Final   Candida auris NOT DETECTED NOT DETECTED Final   Candida glabrata NOT DETECTED NOT DETECTED Final   Candida krusei NOT DETECTED NOT DETECTED Final   Candida parapsilosis NOT DETECTED NOT DETECTED Final   Candida tropicalis NOT DETECTED NOT DETECTED Final   Cryptococcus neoformans/gattii NOT DETECTED NOT DETECTED Final   Meth resistant mecA/C and MREJ DETECTED (A) NOT DETECTED Final    Comment: CRITICAL RESULT CALLED TO, READ BACK  BY AND VERIFIED WITH: NATHAN BELUE @0011  ON 12/01/20 SKL Performed at [facility], [address], [place], [place] [address]     IMAGING RESULTS:  I have personally reviewed the films ?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  MRI brain Findings of chronic microvascular ischemia and remote left occipital infarct  Impression/Recommendation  Encephalopathy- secondary to infection  and possible meds, also receiving morphine here ? ?MRSA BACTEREMIA in a patient with no PMH except for back pain Has multiple excoriations on the forearms - could be the source- will do culture On vancomycin Cefepime and flagyl have been discontinued  CERVICAL Epidural abscess c2-c5 Recommend draining this so as to decrease the bioburden and also for antibiotic to work better especially with MRSA  B/l pulmonary nodules- likely septic emboli-  Need to r/o endocarditis- 2 d echo to r/o endocarditis  Thrombocytopenia could be from infection But with high AST/ need to r/o alcohol use- but wife says he seldom drinks  Anemia- could be dilutional but he has been taking BC powder, advil, ibuprofen so need to r/o GI loss  AkI secondary to infection, dehydration and possible NSAID related Improved with Iv fluids ?  Hypokalemia- being corrected  HIV non  reactive ___________________________________________________ Discussed with wife, requesting provider and care team Note:  This document was prepared using Dragon voice recognition software and may include unintentional dictation errors.

## 2020-12-01 NOTE — Anesthesia Procedure Notes (Signed)
Procedure Name: Intubation Date/Time: 12/01/2020 5:33 PM Performed by: Kelton Pillar, CRNA Pre-anesthesia Checklist: Patient identified, Emergency Drugs available, Suction available and Patient being monitored Patient Re-evaluated:Patient Re-evaluated prior to induction Oxygen Delivery Method: Circle system utilized Preoxygenation: Pre-oxygenation with 100% oxygen Induction Type: IV induction Ventilation: Mask ventilation without difficulty Laryngoscope Size: McGraph and 3 Grade View: Grade I Tube type: Oral Tube size: 7.0 mm Number of attempts: 1 Airway Equipment and Method: Stylet and Oral airway Placement Confirmation: ETT inserted through vocal cords under direct vision, positive ETCO2, breath sounds checked- equal and bilateral and CO2 detector Secured at: 21 cm Tube secured with: Tape Dental Injury: Teeth and Oropharynx as per pre-operative assessment

## 2020-12-01 NOTE — Progress Notes (Signed)
Per md, do not need to infuse remaining 1 Platelet and 1 PRBC's.

## 2020-12-02 ENCOUNTER — Encounter: Payer: Self-pay | Admitting: Neurosurgery

## 2020-12-02 ENCOUNTER — Inpatient Hospital Stay: Payer: BC Managed Care – PPO

## 2020-12-02 DIAGNOSIS — I160 Hypertensive urgency: Secondary | ICD-10-CM | POA: Diagnosis not present

## 2020-12-02 DIAGNOSIS — R7881 Bacteremia: Secondary | ICD-10-CM | POA: Diagnosis not present

## 2020-12-02 DIAGNOSIS — G9341 Metabolic encephalopathy: Secondary | ICD-10-CM | POA: Diagnosis not present

## 2020-12-02 DIAGNOSIS — B9562 Methicillin resistant Staphylococcus aureus infection as the cause of diseases classified elsewhere: Secondary | ICD-10-CM | POA: Diagnosis not present

## 2020-12-02 DIAGNOSIS — G062 Extradural and subdural abscess, unspecified: Secondary | ICD-10-CM | POA: Diagnosis not present

## 2020-12-02 DIAGNOSIS — G061 Intraspinal abscess and granuloma: Secondary | ICD-10-CM | POA: Diagnosis not present

## 2020-12-02 DIAGNOSIS — G9340 Encephalopathy, unspecified: Secondary | ICD-10-CM | POA: Diagnosis not present

## 2020-12-02 LAB — COMPREHENSIVE METABOLIC PANEL
ALT: 32 U/L (ref 0–44)
AST: 40 U/L (ref 15–41)
Albumin: 1.9 g/dL — ABNORMAL LOW (ref 3.5–5.0)
Alkaline Phosphatase: 107 U/L (ref 38–126)
Anion gap: 13 (ref 5–15)
BUN: 36 mg/dL — ABNORMAL HIGH (ref 6–20)
CO2: 26 mmol/L (ref 22–32)
Calcium: 8.1 mg/dL — ABNORMAL LOW (ref 8.9–10.3)
Chloride: 100 mmol/L (ref 98–111)
Creatinine, Ser: 0.76 mg/dL (ref 0.61–1.24)
GFR, Estimated: >60 mL/min (ref >60)
Glucose, Bld: 171 mg/dL — ABNORMAL HIGH (ref 70–99)
Potassium: 2.6 mmol/L — CL (ref 3.5–5.1)
Sodium: 139 mmol/L (ref 135–145)
Total Bilirubin: 1.4 mg/dL — ABNORMAL HIGH (ref 0.3–1.2)
Total Protein: 5.2 g/dL — ABNORMAL LOW (ref 6.5–8.1)

## 2020-12-02 LAB — BASIC METABOLIC PANEL
Anion gap: 5 (ref 5–15)
BUN: 38 mg/dL — ABNORMAL HIGH (ref 6–20)
CO2: 27 mmol/L (ref 22–32)
Calcium: 8.3 mg/dL — ABNORMAL LOW (ref 8.9–10.3)
Chloride: 106 mmol/L (ref 98–111)
Creatinine, Ser: 0.69 mg/dL (ref 0.61–1.24)
GFR, Estimated: >60 mL/min (ref >60)
Glucose, Bld: 181 mg/dL — ABNORMAL HIGH (ref 70–99)
Potassium: 3.1 mmol/L — ABNORMAL LOW (ref 3.5–5.1)
Sodium: 138 mmol/L (ref 135–145)

## 2020-12-02 LAB — CBC WITH DIFFERENTIAL/PLATELET
Abs Immature Granulocytes: 0.08 10*3/uL — ABNORMAL HIGH (ref 0.00–0.07)
Basophils Absolute: 0.1 10*3/uL (ref 0.0–0.1)
Basophils Relative: 1 %
Eosinophils Absolute: 0 10*3/uL (ref 0.0–0.5)
Eosinophils Relative: 0 %
HCT: 32.6 % — ABNORMAL LOW (ref 39.0–52.0)
Hemoglobin: 11.3 g/dL — ABNORMAL LOW (ref 13.0–17.0)
Immature Granulocytes: 1 %
Lymphocytes Relative: 6 %
Lymphs Abs: 0.9 10*3/uL (ref 0.7–4.0)
MCH: 29.4 pg (ref 26.0–34.0)
MCHC: 34.7 g/dL (ref 30.0–36.0)
MCV: 84.9 fL (ref 80.0–100.0)
Monocytes Absolute: 0.6 10*3/uL (ref 0.1–1.0)
Monocytes Relative: 4 %
Neutro Abs: 13.5 10*3/uL — ABNORMAL HIGH (ref 1.7–7.7)
Neutrophils Relative %: 88 %
Platelets: 92 10*3/uL — ABNORMAL LOW (ref 150–400)
RBC: 3.84 MIL/uL — ABNORMAL LOW (ref 4.22–5.81)
RDW: 15.5 % (ref 11.5–15.5)
WBC: 15.2 10*3/uL — ABNORMAL HIGH (ref 4.0–10.5)
nRBC: 0 % (ref 0.0–0.2)

## 2020-12-02 LAB — URINE CULTURE: Culture: NO GROWTH

## 2020-12-02 LAB — PROTIME-INR
INR: 1.2 (ref 0.8–1.2)
Prothrombin Time: 15.6 seconds — ABNORMAL HIGH (ref 11.4–15.2)

## 2020-12-02 LAB — VANCOMYCIN, PEAK: Vancomycin Pk: 19 ug/mL — ABNORMAL LOW (ref 30–40)

## 2020-12-02 LAB — MAGNESIUM: Magnesium: 2.3 mg/dL (ref 1.7–2.4)

## 2020-12-02 LAB — APTT: aPTT: 33 seconds (ref 24–36)

## 2020-12-02 LAB — TRIGLYCERIDES: Triglycerides: 198 mg/dL — ABNORMAL HIGH (ref <150)

## 2020-12-02 LAB — POTASSIUM: Potassium: 2.9 mmol/L — ABNORMAL LOW (ref 3.5–5.1)

## 2020-12-02 MED ORDER — ENOXAPARIN SODIUM 40 MG/0.4ML IJ SOSY
40.0000 mg | PREFILLED_SYRINGE | INTRAMUSCULAR | Status: DC
  Administered 2020-12-02 – 2020-12-08 (×7): 40 mg via SUBCUTANEOUS
  Filled 2020-12-02 (×7): qty 0.4

## 2020-12-02 MED ORDER — POTASSIUM CHLORIDE 10 MEQ/100ML IV SOLN
10.0000 meq | INTRAVENOUS | Status: AC
  Administered 2020-12-02 (×4): 10 meq via INTRAVENOUS
  Filled 2020-12-02 (×4): qty 100

## 2020-12-02 MED ORDER — ACETAMINOPHEN 500 MG PO TABS
1000.0000 mg | ORAL_TABLET | Freq: Four times a day (QID) | ORAL | Status: AC
  Administered 2020-12-02 – 2020-12-04 (×8): 1000 mg via ORAL
  Filled 2020-12-02 (×8): qty 2

## 2020-12-02 MED ORDER — DEXMEDETOMIDINE HCL IN NACL 400 MCG/100ML IV SOLN
0.4000 ug/kg/h | INTRAVENOUS | Status: DC
  Administered 2020-12-02: 08:00:00 0.4 ug/kg/h via INTRAVENOUS
  Filled 2020-12-02: qty 100

## 2020-12-02 MED ORDER — OXYCODONE HCL 5 MG PO TABS
5.0000 mg | ORAL_TABLET | Freq: Four times a day (QID) | ORAL | Status: DC
  Administered 2020-12-02 – 2020-12-12 (×37): 5 mg via ORAL
  Filled 2020-12-02 (×37): qty 1

## 2020-12-02 MED ORDER — POTASSIUM CHLORIDE 10 MEQ/100ML IV SOLN
10.0000 meq | INTRAVENOUS | Status: AC
  Administered 2020-12-02 (×4): 10 meq via INTRAVENOUS
  Filled 2020-12-02 (×4): qty 100

## 2020-12-02 MED ORDER — POTASSIUM CHLORIDE 20 MEQ PO PACK: 40.0000 meq | PACK | Freq: Two times a day (BID) | ORAL | Status: DC

## 2020-12-02 MED ORDER — KETOROLAC TROMETHAMINE 15 MG/ML IJ SOLN
15.0000 mg | Freq: Four times a day (QID) | INTRAMUSCULAR | Status: AC | PRN
  Administered 2020-12-03 – 2020-12-06 (×7): 15 mg via INTRAVENOUS
  Filled 2020-12-02 (×7): qty 1

## 2020-12-02 NOTE — Progress Notes (Signed)
Pts wife gave me fmla ppwk, I put in chart folder, and messaged neurosurgeon to complete.

## 2020-12-02 NOTE — Progress Notes (Signed)
NAME:  Caleb Fisher, MRN:  [id number], DOB:  07-Dec-1975, LOS: 2 ADMISSION DATE:  11/30/2020 CONSULTATION DATE: 12/01/2020 REFERRING MD:  Doristine Mango MD CHIEF COMPLAINT:  Spinal Abscess, MRSA bacteremia    BRIEF SYNOPSIS: 45 y/o M presented to ED with AMS, neck and back pain. Noted to be septic, started on broad spectrum abx. Found to have MRSA bacteremia and cervical epidural abscess. Being seen by ID and neurosurg for possible laminectomy with washout once K+ and platelets stabilize. Currently on vancomycin.   History of Present Illness:  Caleb Fisher is a 45 year old male smoker with no significant past medical history presenting with altered mental status, neck pain, frequent falls and sepsis.  He was started on broad antibiotic coverage, attempted LP in the ED and IR which was unsuccessful CT imaging with no acute process.  He then had a MRI spine showing epidural cervical abscess around C2-C5 Neurosurgery consulted and he was taken to the OR however labs in the PACU revealed thrombocytopenia and hypokalemia hence surgery was canceled and he was transferred to ICU for further stabilization and correction of potassium and platelets.  Pertinent  Medical History  No pertinent past medical history reported by wife  Current smoker  Opioid abuse   Significant Hospital Events: Including procedures, antibiotic start and stop dates in addition to other pertinent events   11/15: seen in ED for AMS, back & neck pain x2 days  11/16: admitted to ICU for management of MRSA bacteremia & cervical epidural abscess. Given multiple units of platelets and potassium to stabilize prior to possible neurosurgery laminectomy with washout.   11/17: S/P hemilaminectomy with washout, drain placed. Intubated overnight. Extubated without issue this AM. Continues to be hypokalemia, despite replacement.   Micro Data:  11/15: blood cultures - MRSA +  11/16: MRSA PCR- positive 11/16: urine culture- negative  11/16:  Wound culture - pending   Antimicrobials:  11/15: Cefepime x1, metronidazole, ceftriaxone 11/16: vancomycin, ceftaroline   Interim History / Subjective:  S/P hemilaminectomy with washout, drain placed. Intubated overnight. Extubated without issue this AM. Continues to be hypokalemic, despite replacement. Patient doing well, following commands.   Objective   Blood pressure (!) 144/95, pulse 91, temperature 98.4 F (36.9 C), temperature source Oral, resp. rate 18, height 5\' 9"  (1.753 m), weight 73.4 kg, SpO2 95 %.    Vent Mode: PSV FiO2 (%):  [40 %-50 %] 40 % Set Rate:  [15 bmp-16 bmp] 16 bmp Vt Set:  [450 mL] 450 mL PEEP:  [5 cmH20] 5 cmH20 Pressure Support:  [5 cmH20] 5 cmH20   Intake/Output Summary (Last 24 hours) at 12/02/2020 1207 Last data filed at 12/02/2020 1100 Gross per 24 hour  Intake 2885.87 ml  Output 3425 ml  Net -539.13 ml   Filed Weights   11/30/20 1311 11/30/20 2216  Weight: 72.6 kg 73.4 kg   REVIEW OF SYSTEMS: Unable to attain d/t metabolic encephalopathy   PHYSICAL EXAMINATION:  GENERAL: 45 year-old critically ill patient lying in bed. Intubated, following commands.  EYES: Pupils equal, round, reactive to light and accommodation. Mydriatic pupils. No scleral icterus. Extraocular muscles intact.  HEENT: Head atraumatic, normocephalic. Oropharynx and nasopharynx clear.  NECK:  Stiffness, no jugular venous distention. No thyroid enlargement, no tenderness.  LUNGS: Clear breath sounds bilaterally. No use of accessory muscles of respiration.  CARDIOVASCULAR: S1, S2 normal. Sinus tachycardia. No murmurs, rubs, or gallops.  ABDOMEN: Soft, nontender, nondistended. Bowel sounds present. No organomegaly or mass.  EXTREMITIES: No pedal edema, cyanosis, or  clubbing. Scattered excoriated lesions to both arms.  NEUROLOGIC: Able to move all limb. Some weakness in R shoulder. Able to follow simple commands.    Labs/imaging that I havepersonally reviewed  (right click  and "Reselect all SmartList Selections" daily)  11/15: UDS- positive for opioids  11/15: MRI cervical spine- C-spine C2-5 circumfrential epidural abscess, no spinal cord signal change 11/15: MRI brain- No acute intracranial abnormality. 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 11/16: Echo- EF- 89-38%; grade I diastolic dysfunction, no vegetation   Resolved Hospital Problem list    SYNOPSIS: 45 y/o M presented to ED with AMS, neck and back pain. Noted to be septic, started on broad spectrum abx. Found to have MRSA bacteremia and cervical epidural abscess. Being seen by ID and neurosurg for possible laminectomy with washout once K+ and platelets stabilize. Currently on vancomycin & ceftaroline.    ASSESSMENT AND PLAN  Sepsis  MRSA bacteremia with epidural spinal abscess  - ID consult - per ID continue vancomycin & ceftaroline  - Neurosurgery consult- s/p cervical hemilaminectomy and washout (11/16) - currently has drain (x1-2 d) - Follow cultures, fever, curve, trend WBCs - Source unknown - UA & CXR negative, echo negative for vegetation, wife denies known IVDU, skin excoriations? Skin culture obtained - follow results.   Acute metabolic encephalopathy (resolving) - Query opioid withdrawal vs. Meningeal spread of bacteria  -LP unable to be attained after multiple attempts  -Continue vancomycin & ceftaroline  - Give morphine PRN, aspiration precautions   Hypokalemia  - Replace K+ aggressively - continues to be low despite aggressive replacement. Will consider more in depth workup for cause. Paradoxical hypokalemia after replacement? Aldosterone secreting adenoma?  - Pharmacy consult   Thrombocytopenia  - Likely from sepsis/infection  - Replace platelets as needed to stabilize   Transaminitis  - Elevated AST - Could be from liver shock or alcohol abuse although wife says he seldom drinks  - Will check RUQ [place]  AKI - Improved with IV fluids  -  Trend CMP  Hypertension  - Per neurosurgery MAP goal: 70-110 - Continue nicardipine gtt as needed  - PRN hydralazine & labetalol     Best practice (right click and "Reselect all SmartList Selections" daily)  Diet: NPO Pain/Anxiety/Delirium protocol (if indicated): No VAP protocol (if indicated): Not indicated DVT prophylaxis: LMWH and Contraindicated GI prophylaxis: N/A Glucose control:  SSI No Central venous access:  N/A Arterial line:  N/A Foley:  N/A Mobility:  bed rest  Code Status:  FULL Disposition:ICU  Labs   CBC: Recent Labs  Lab 11/30/20 1310 12/01/20 0030 12/01/20 0147 12/01/20 0157 12/01/20 1047 12/02/20 0327  WBC 14.2* 8.8  --  13.0* 15.6* 15.2*  NEUTROABS 12.8*  --   --   --  14.0* 13.5*  HGB 12.8* 9.8*  --  11.3* 11.0* 11.3*  HCT 36.6* 28.1*  --  32.6* 31.8* 32.6*  MCV 85.1 85.9  --  83.8 84.4 84.9  PLT 125* NO PLT 77* 80* 104* 92*    Basic Metabolic Panel: Recent Labs  Lab 11/30/20 1310 12/01/20 0157 12/01/20 1047 12/02/20 0327  NA 135 137 136 139  K 3.0* 2.4* 2.3* 2.6*  CL 99 103 102 100  CO2 23 23 23 26   GLUCOSE 117* 86 83 171*  BUN 54* 38* 34* 36*  CREATININE 1.58* 1.15 1.01 0.76  CALCIUM 8.4* 8.2* 8.1* 8.1*  MG 2.4  --   --  2.3   GFR: Estimated Creatinine  Clearance: 115.4 mL/min (by C-G formula based on SCr of 0.76 mg/dL). Recent Labs  Lab 11/30/20 1310 11/30/20 1643 11/30/20 2237 12/01/20 0030 12/01/20 0147 12/01/20 0157 12/01/20 1047 12/02/20 0327  PROCALCITON 17.28  --   --   --   --  22.89  --   --   WBC 14.2*  --   --  8.8  --  13.0* 15.6* 15.2*  LATICACIDVEN 2.0* 2.2* 1.7  --  1.9  --   --   --     Liver Function Tests: Recent Labs  Lab 11/30/20 1310 12/01/20 0157 12/02/20 0327  AST 102* 97* 40  ALT 55* 49* 32  ALKPHOS 166* 129* 107  BILITOT 1.1 1.5* 1.4*  PROT 6.5 5.3* 5.2*  ALBUMIN 2.9* 2.2* 1.9*   No results for input(s): LIPASE, AMYLASE in the last 168 hours. Recent Labs  Lab 11/30/20 1454   AMMONIA 14    ABG    Component Value Date/Time   PHART 7.39 12/01/2020 2325   PCO2ART 44 12/01/2020 2325   PO2ART 124 (H) 12/01/2020 2325   HCO3 26.6 12/01/2020 2325   O2SAT 98.8 12/01/2020 2325     Coagulation Profile: Recent Labs  Lab 11/30/20 1310 12/01/20 0147 12/01/20 1047 12/02/20 0327  INR 1.2 1.3* 1.4* 1.2    Cardiac Enzymes: No results for input(s): CKTOTAL, CKMB, CKMBINDEX, TROPONINI in the last 168 hours.  HbA1C: Hgb A1c MFr Bld  Date/Time Value Ref Range Status  12/01/2020 01:57 AM 5.8 (H) 4.8 - 5.6 % Final    Comment:    (NOTE) Pre diabetes:          5.7%-6.4%  Diabetes:              >6.4%  Glycemic control for   <7.0% adults with diabetes     CBG: Recent Labs  Lab 12/01/20 1131  GLUCAP 97     Past Medical History:  He,  has no past medical history on file.   Surgical History:   Past Surgical History:  Procedure Laterality Date   POSTERIOR CERVICAL LAMINECTOMY FOR EPIDURAL ABSCESS N/A 12/01/2020   Procedure: POSTERIOR CERVICAL LAMINECTOMY FOR EPIDURAL ABSCESS C2-C4;  Surgeon: Meade Maw, MD;  Location: [facility] ORS;  Service: Neurosurgery;  Laterality: N/A;     Social History:   reports current alcohol use.   Family History:  His family history is not on file.   Allergies No Known Allergies   Home Medications  Prior to Admission medications   Medication Sig Start Date End Date Taking? Authorizing Provider  Aspirin-Salicylamide-Caffeine (BC HEADACHE POWDER PO) Take by mouth.   Yes [provider]  ibuprofen (ADVIL) 800 MG tablet Take 800 mg by mouth every 8 (eight) hours as needed.   Yes [provider]    Vic Blackbird PA-S [place] [facility]

## 2020-12-02 NOTE — Progress Notes (Signed)
Attending:    Subjective: Had posterior hemilaminectomy on R fro mC2 to C4 for epidural abscess drainage last night Remained extubated Following commands, passing SBT  Objective: Vitals:   12/02/20 0900 12/02/20 1000 12/02/20 1100 12/02/20 1156  BP: (!) 134/92 125/84 (!) 138/91 (!) 144/95  Pulse: 97 89 91   Resp: 16 17 18    Temp:    98.4 F (36.9 C)  TempSrc:    Oral  SpO2: 96% 94% 95%   Weight:      Height:       Vent Mode: PSV FiO2 (%):  [40 %-50 %] 40 % Set Rate:  [15 bmp-16 bmp] 16 bmp Vt Set:  [450 mL] 450 mL PEEP:  [5 cmH20] 5 cmH20 Pressure Support:  [5 cmH20] 5 cmH20  Intake/Output Summary (Last 24 hours) at 12/02/2020 1208 Last data filed at 12/02/2020 1100 Gross per 24 hour  Intake 2885.87 ml  Output 3425 ml  Net -539.13 ml    General:  In bed on vent HENT: NCAT ETT in place PULM: CTA B, vent supported breathing CV: RRR, no mgr GI: BS+, soft, nontender MSK: normal bulk and tone Neuro: awake, following commands    CBC    Component Value Date/Time   WBC 15.2 (H) 12/02/2020 0327   RBC 3.84 (L) 12/02/2020 0327   HGB 11.3 (L) 12/02/2020 0327   HCT 32.6 (L) 12/02/2020 0327   PLT 92 (L) 12/02/2020 0327   MCV 84.9 12/02/2020 0327   MCH 29.4 12/02/2020 0327   MCHC 34.7 12/02/2020 0327   RDW 15.5 12/02/2020 0327   LYMPHSABS 0.9 12/02/2020 0327   MONOABS 0.6 12/02/2020 0327   EOSABS 0.0 12/02/2020 0327   BASOSABS 0.1 12/02/2020 0327    BMET    Component Value Date/Time   NA 139 12/02/2020 0327   K 2.6 (LL) 12/02/2020 0327   CL 100 12/02/2020 0327   CO2 26 12/02/2020 0327   GLUCOSE 171 (H) 12/02/2020 0327   BUN 36 (H) 12/02/2020 0327   CREATININE 0.76 12/02/2020 0327   CALCIUM 8.1 (L) 12/02/2020 0327   GFRNONAA >60 12/02/2020 0327    CXR images no infiltrate, lungs clear, ett in place  Impression/Plan: Need for intubation post operatively> now passing SBT, following commands, extubate Metabolic encephalopathy> still poorly understood,  suspect he is withdrawing from something but his wife denies that he takes anything other than opiates, precedex today, prn pain control with morphine MRSA bacteremia > continue vancomycin per ID Hypertension with hypokalemia> hyperaldosteronism? Defer to [place] per [facility]  My cc time 31 minutes  Roselie Awkward, MD [facility] Pager: [phone] Cell: [phone] After 7pm: [phone]

## 2020-12-02 NOTE — Progress Notes (Signed)
Date of Admission:  11/30/2020     ID: Caleb Fisher is a 45 y.o. male  Active Problems:   Encephalopathy acute   Altered mental status   MRSA bacteremia   Abscess in epidural space of cervical spine   Hypertensive urgency   Hypokalemia   Thrombocytopenia (HCC)   Acute metabolic encephalopathy   Severe sepsis (HCC)    Subjective: Patient is doing much better He is alert and oriented As per patient he takes Percocet 5 to 7 tablets a day He denies IV drug use or any other illegal drug use Drinks couple of cans of beer a day Patient underwent posterior hemilaminectomy on the right side from C2-C4 for epidural abscess drainage.  This was done by Dr. Marcell Barlow on 12/01/2020. Medications:   acetaminophen  1,000 mg Oral Q6H   chlorhexidine gluconate (MEDLINE KIT)  15 mL Mouth Rinse BID   Chlorhexidine Gluconate Cloth  6 each Topical Q0600   enoxaparin (LOVENOX) injection  40 mg Subcutaneous Q24H   mouth rinse  15 mL Mouth Rinse 10 times per day   mupirocin ointment  1 application Nasal BID   oxyCODONE  5 mg Oral Q6H    Objective: Vital signs in last 24 hours: Temp:  [97.9 F (36.6 C)-101.1 F (38.4 C)] 98.4 F (36.9 C) (11/17 1156) Pulse Rate:  [87-118] 93 (11/17 1300) Resp:  [15-28] 19 (11/17 1300) BP: (114-179)/(69-101) 143/90 (11/17 1300) SpO2:  [89 %-100 %] 94 % (11/17 1300) FiO2 (%):  [40 %-50 %] 40 % (11/17 0845)  PHYSICAL EXAM:  General: Alert, cooperative, no distress, appears stated age.  Head: Normocephalic, without obvious abnormality, atraumatic. Eyes: Conjunctivae clear, anicteric sclerae. Pupils are equal ENT Nares normal. No drainage or sinus tenderness. Lips, mucosa, and tongue normal. No Thrush Neck:  symmetrical, no adenopathy, thyroid: non tender no carotid bruit and no JVD. Back: No CVA tenderness. Lungs: Clear to auscultation bilaterally. No Wheezing or Rhonchi. No rales. Heart: Regular rate and rhythm, no murmur, rub or gallop. Abdomen: Soft,  non-tender,not distended. Bowel sounds normal. No masses Extremities: Excoriated lesions forearms. Skin: As above Lymph: Cervical, supraclavicular normal. Neurologic: Grossly non-focal  Lab Results Recent Labs    12/01/20 1047 12/02/20 0327 12/02/20 1309  WBC 15.6* 15.2*  --   HGB 11.0* 11.3*  --   HCT 31.8* 32.6*  --   NA 136 139  --   K 2.3* 2.6* 2.9*  CL 102 100  --   CO2 23 26  --   BUN 34* 36*  --   CREATININE 1.01 0.76  --    Liver Panel Recent Labs    12/01/20 0157 12/02/20 0327  PROT 5.3* 5.2*  ALBUMIN 2.2* 1.9*  AST 97* 40  ALT 49* 32  ALKPHOS 129* 107  BILITOT 1.5* 1.4*   Microbiology: 11/30/2020 blood culture 4 out of 4 MRSA MRSA nares positive Wound culture of the right forearm sent on 12/01/2020 gram-positive cocci on stain 12/01/2020 epidural abscess culture and gram stain gram-positive cocci Studies/Results: DG Cervical Spine 2-3 Views  Result Date: 12/01/2020 CLINICAL DATA:  Epidural abscess EXAM: CERVICAL SPINE - 2-3 VIEW; DG C-ARM 1-60 MIN COMPARISON:  MRI cervical spine 11/30/2020 FINDINGS: AP and lateral C-arm images were obtained of the cervical spine. The lateral view, there are surgical instruments posteriorly at C2-3. Instruments are posterior to the spinous processes of C2 and C3. No skeletal abnormality. IMPRESSION: Surgical localization C2-3 posteriorly. Electronically Signed   By: Marlan Palau M.D.  On: 12/01/2020 18:10   MR BRAIN W WO CONTRAST  Addendum Date: 11/30/2020   ADDENDUM REPORT: 11/30/2020 22:08 ADDENDUM: These results were called by telephone at the time of interpretation on 11/30/2020 at 10:08 pm to provider [facility] , who verbally acknowledged these results. Electronically Signed   By: Ulyses Jarred M.D.   On: 11/30/2020 22:08   Result Date: 11/30/2020 CLINICAL DATA:  Neck pain and altered mental status EXAM: MRI HEAD WITHOUT AND WITH CONTRAST MRI CERVICAL SPINE WITHOUT AND WITH CONTRAST TECHNIQUE: Multiplanar,  multiecho pulse sequences of the brain and surrounding structures, and cervical spine, to include the craniocervical junction and cervicothoracic junction, were obtained without and with intravenous contrast. CONTRAST:  7 mL Gadavist COMPARISON:  None. FINDINGS: MRI HEAD FINDINGS Brain: No acute infarct, mass effect or extra-axial collection. No acute or chronic hemorrhage. There is multifocal hyperintense T2-weighted signal within the white matter. Parenchymal volume and CSF spaces are normal. 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 The midline structures are normal. Vascular: Major flow voids are preserved. Skull and upper cervical spine: Normal calvarium and skull base. Visualized upper cervical spine and soft tissues are normal. Sinuses/Orbits:No paranasal sinus fluid levels or advanced mucosal thickening. No mastoid or middle ear effusion. Normal orbits. MRI CERVICAL SPINE FINDINGS Alignment: Physiologic. Vertebrae: No fracture, evidence of discitis, or bone lesion. Cord: Spinal cord itself is normal, but there is a circumferential collection the length the cervical spine with peripheral contrast enhancement. The thickness of the collection is greatest at the C2 and C3 levels, measuring up to 6 mm in the 7 o'clock position of the spinal canal. Posterior Fossa, vertebral arteries, paraspinal tissues: Small prevertebral effusion from C2-C5, measuring 7 mm in thickness. Disc levels: There is mild multilevel degenerative disc disease, greatest at C5-6. There is no bony spinal canal stenosis. The CSF space is effaced along the entire length of the cervical spine due to the above described epidural collection. IMPRESSION: 1. 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 2. No spinal cord signal change. 3. Small prevertebral effusion from C2-C5. 4. No acute intracranial abnormality.  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 Electronically Signed: By: Ulyses Jarred M.D. On: 11/30/2020 21:51   MR CERVICAL SPINE W WO CONTRAST  Addendum Date: 11/30/2020   ADDENDUM REPORT: 11/30/2020 22:08 ADDENDUM: These results were called by telephone at the time of interpretation on 11/30/2020 at 10:08 pm to provider [facility] , who verbally acknowledged these results. Electronically Signed   By: Ulyses Jarred M.D.   On: 11/30/2020 22:08   Result Date: 11/30/2020 CLINICAL DATA:  Neck pain and altered mental status EXAM: MRI HEAD WITHOUT AND WITH CONTRAST MRI CERVICAL SPINE WITHOUT AND WITH CONTRAST TECHNIQUE: Multiplanar, multiecho pulse sequences of the brain and surrounding structures, and cervical spine, to include the craniocervical junction and cervicothoracic junction, were obtained without and with intravenous contrast. CONTRAST:  7 mL Gadavist COMPARISON:  None. FINDINGS: MRI HEAD FINDINGS Brain: No acute infarct, mass effect or extra-axial collection. No acute or chronic hemorrhage. There is multifocal hyperintense T2-weighted signal within the white matter. Parenchymal volume and CSF spaces are normal. 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 The midline structures are normal. Vascular: Major flow voids are preserved. Skull and upper cervical spine: Normal calvarium and skull base. Visualized upper cervical spine and soft tissues are  normal. Sinuses/Orbits:No paranasal sinus fluid levels or advanced mucosal thickening. No mastoid or middle ear effusion. Normal orbits. MRI CERVICAL SPINE FINDINGS Alignment: Physiologic. Vertebrae: No fracture, evidence of discitis, or bone lesion. Cord: Spinal cord itself is normal, but there is a circumferential collection the length the cervical spine with peripheral contrast enhancement. The thickness of the collection is greatest at the C2 and C3 levels, measuring up to 6 mm in the 7 o'clock position of the spinal  canal. Posterior Fossa, vertebral arteries, paraspinal tissues: Small prevertebral effusion from C2-C5, measuring 7 mm in thickness. Disc levels: There is mild multilevel degenerative disc disease, greatest at C5-6. There is no bony spinal canal stenosis. The CSF space is effaced along the entire length of the cervical spine due to the above described epidural collection. IMPRESSION: 1. 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 2. No spinal cord signal change. 3. Small prevertebral effusion from C2-C5. 4. No acute intracranial abnormality. 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 Electronically Signed: By: Ulyses Jarred M.D. On: 11/30/2020 21:51   CT CHEST ABDOMEN PELVIS W CONTRAST  Result Date: 11/30/2020 CLINICAL DATA:  Multiple falls. EXAM: CT CHEST, ABDOMEN, AND PELVIS WITH CONTRAST TECHNIQUE: Multidetector CT imaging of the chest, abdomen and pelvis was performed following the standard protocol during bolus administration of intravenous contrast. CONTRAST:  15mL OMNIPAQUE IOHEXOL 300 MG/ML  SOLN COMPARISON:  None. FINDINGS: CT CHEST FINDINGS Cardiovascular: There is mild calcification of the aortic arch. The ascending thoracic aorta measures 4.4 cm x 4.3 cm. Normal heart size. No pericardial effusion. Mediastinum/Nodes: A 2.6 cm x 2.6 cm anterolateral right paratracheal lymph node is seen. Mild right hilar lymphadenopathy is also noted. Thyroid gland, trachea, and esophagus demonstrate no significant findings. Lungs/Pleura: Subcentimeter noncalcified lung nodules versus focal scars are seen along the posterolateral aspect of the left apex (axial CT images 7 through 11, CT series 4). A 9 mm noncalcified lung nodule is seen within the lateral aspect of the left lower lobe (axial CT image 34, CT series 4). A 1.1 cm noncalcified lung nodule  is seen within the posteromedial aspect of the right lung base (axial CT image 43, CT series 4). An 8 mm noncalcified lung nodule is seen within the lateral aspect of the right lung base (axial CT image 38, CT series 4). A 5 mm anterior left basilar noncalcified lung nodule is present (axial CT image 38, CT series 4). There is no evidence of an acute infiltrate, pleural effusion or pneumothorax. Musculoskeletal: No chest wall mass or suspicious bone lesions identified. CT ABDOMEN PELVIS FINDINGS Hepatobiliary: No focal liver abnormality is seen. No gallstones, gallbladder wall thickening, or biliary dilatation. Pancreas: Unremarkable. No pancreatic ductal dilatation or surrounding inflammatory changes. Spleen: Normal in size without focal abnormality. Adrenals/Urinary Tract: Adrenal glands are unremarkable. Kidneys are normal in size, without obstructing renal calculi or focal lesion. A 1.2 cm x 1.1 cm nonobstructing renal calculus is seen within the mid right kidney. Mild right-sided hydronephrosis is seen. No ureteral calculi are identified. Mild Peri ureteral inflammatory fat stranding is seen. This extends along the anterior aspect of the right psoas muscle. Bladder is unremarkable. Stomach/Bowel: Stomach is within normal limits. Appendix appears normal. No evidence of bowel wall thickening, distention, or inflammatory changes. Vascular/Lymphatic: Aortic atherosclerosis. No enlarged abdominal or pelvic lymph nodes. Reproductive: Prostate is unremarkable. Other: No  abdominal wall hernia or abnormality. No abdominopelvic ascites. Musculoskeletal: No acute osseous abnormalities are identified. Marked severity degenerative changes are seen at the level of L5-S1. IMPRESSION: 1. Mild right-sided periureteral inflammatory fat stranding which may represent sequelae associated with acute pyelonephritis versus recently passed renal calculus. Correlation with urinalysis is recommended. 2. Multiple noncalcified lung nodules  within both lungs, as described above. Non-contrast chest CT at 3-6 months is recommended. If the nodules are stable at time of repeat CT, then future CT at 18-24 months (from today's scan) is considered optional for low-risk patients, but is recommended for high-risk patients. This recommendation follows the consensus statement: Guidelines for Management of Incidental Pulmonary Nodules Detected on CT Images: From the Fleischner Society 2017; Radiology 2017; 284:228-243. 3. Ascending thoracic aortic aneurysm, measuring 4.4 cm x 4.3 cm. 4. 1.2 cm x 1.1 cm nonobstructing renal calculus within the mid right kidney. 5. Marked severity degenerative changes at the level of L5-S1. 6. Aortic atherosclerosis. Aortic Atherosclerosis (ICD10-I70.0). Electronically Signed   By: Virgina Norfolk M.D.   On: 11/30/2020 16:25   CT T-SPINE NO CHARGE  Result Date: 11/30/2020 CLINICAL DATA:  Altered mental status.  Back pain. EXAM: CT THORACIC SPINE WITHOUT CONTRAST TECHNIQUE: Multidetector CT images of the thoracic were obtained using the standard protocol without intravenous contrast. COMPARISON:  CT chest 11/30/2020 FINDINGS: Alignment: Normal Vertebrae: No thoracic region fracture or focal bone lesion. Paraspinal and other soft tissues: Negative Disc levels: No significant thoracic region degenerative change. No apparent stenosis of the canal or foramina. No significant facet arthritis. IMPRESSION: No acute or traumatic finding. No significant abnormality in the thoracic region. Electronically Signed   By: Nelson Chimes M.D.   On: 11/30/2020 16:14   CT L-SPINE NO CHARGE  Result Date: 11/30/2020 CLINICAL DATA:  Neck and back pain.  Lethargic.  Multiple falls. EXAM: CT LUMBAR SPINE WITHOUT CONTRAST TECHNIQUE: Multidetector CT imaging of the lumbar spine was performed without intravenous contrast administration. Multiplanar CT image reconstructions were also generated. COMPARISON:  Radiography 11/30/2020 FINDINGS:  Segmentation: 5 lumbar type vertebral bodies. Alignment: No malalignment. Vertebrae: No evidence of fracture. Chronic discogenic sclerotic change of the endplates at J8-S5. Paraspinal and other soft tissues: Negative Disc levels: No significant disc level finding at L3-4 or above. L4-5: Circumferential protrusion of the disc. Facet and ligamentous hypertrophy. Multifactorial stenosis at this level that could cause neural compression. L5-S1: Chronic disc degeneration with sclerotic bone changes as noted above. Shallow protrusion of the disc. Stenosis of the subarticular lateral recesses that could cause neural compression. IMPRESSION: No acute lumbar region finding. 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 Electronically Signed   By: Nelson Chimes M.D.   On: 11/30/2020 16:13   DG Chest Port 1 View  Result Date: 12/02/2020 CLINICAL DATA:  Acute respiratory failure with hypoxia. EXAM: PORTABLE CHEST 1 VIEW COMPARISON:  December 01, 2020. FINDINGS: Stable cardiomediastinal silhouette. Endotracheal tube is in good position. Stable mild interstitial densities are noted which may represent minimal pulmonary edema or possibly atypical inflammation, or possibly scarring. Bony thorax is unremarkable. IMPRESSION: Stable mild interstitial densities are noted bilaterally as described above. Stable support apparatus. Electronically Signed   By: Marijo Conception M.D.   On: 12/02/2020 08:11   DG Chest Port 1 View  Result Date: 12/01/2020 CLINICAL DATA:  Status post intubation. EXAM: PORTABLE CHEST 1 VIEW COMPARISON:  Chest radiograph dated 12/01/2020. FINDINGS: Endotracheal tube with tip approximately 5  cm above the carina. Bilateral interstitial prominence, left greater right. No focal consolidation, pleural effusion, pneumothorax. The cardiac silhouette is within normal limits. No acute osseous pathology. IMPRESSION: 1. Endotracheal tube above the  carina. 2. Bilateral interstitial prominence. No focal consolidation. Electronically Signed   By: Anner Crete M.D.   On: 12/01/2020 20:05   DG Chest Port 1 View  Result Date: 12/01/2020 CLINICAL DATA:  Acute respiratory failure with hypoxia EXAM: PORTABLE CHEST 1 VIEW COMPARISON:  November 30, 2020. FINDINGS: Stable cardiomediastinal silhouette. Mild central pulmonary vascular congestion is noted. Increased bilateral lung opacities are noted concerning for edema or pneumonia. Bony thorax is unremarkable. IMPRESSION: Mild central pulmonary vascular congestion. Increased bilateral lung opacities are noted concerning for edema or pneumonia. Electronically Signed   By: Marijo Conception M.D.   On: 12/01/2020 09:09   DG C-Arm 1-60 Min  Result Date: 12/01/2020 CLINICAL DATA:  Epidural abscess EXAM: CERVICAL SPINE - 2-3 VIEW; DG C-ARM 1-60 MIN COMPARISON:  MRI cervical spine 11/30/2020 FINDINGS: AP and lateral C-arm images were obtained of the cervical spine. The lateral view, there are surgical instruments posteriorly at C2-3. Instruments are posterior to the spinous processes of C2 and C3. No skeletal abnormality. IMPRESSION: Surgical localization C2-3 posteriorly. Electronically Signed   By: Franchot Gallo M.D.   On: 12/01/2020 18:10   ECHOCARDIOGRAM COMPLETE  Result Date: 12/01/2020    ECHOCARDIOGRAM REPORT   Patient Name:   DREAM NODAL Date of Exam: 12/01/2020 Medical Rec #:  [id number]   Height:       69.0 in Accession #:    [id number]  Weight:       161.8 lb Date of Birth:  [birth year]   BSA:          1.888 m Patient Age:    25 years    BP:           139/97 mmHg Patient Gender: M           HR:           112 bpm. Exam Location:  [facility] Procedure: 2D Echo, Cardiac Doppler and Color Doppler Indications:     Bacteremia R78.81  History:         Patient has no prior history of Echocardiogram examinations. No                  past medical history on file.  Sonographer:     [name] Referring Phys:  [id number]  Tsosie Billing Diagnosing Phys: Kate Sable MD  Sonographer Comments: Suboptimal apical window. IMPRESSIONS  1. Left ventricular ejection fraction, by estimation, is 70 to 75%. The left ventricle has hyperdynamic function. The left ventricle has no regional wall motion abnormalities. Left ventricular diastolic parameters are consistent with Grade I diastolic dysfunction (impaired relaxation).  2. Right ventricular systolic function is normal. The right ventricular size is normal.  3. The mitral valve is normal in structure. No evidence of mitral valve regurgitation.  4. The aortic valve is grossly normal. Aortic valve regurgitation is not visualized.  5. Aortic dilatation noted. There is mild dilatation of the aortic root, measuring 41 mm. Conclusion(s)/Recommendation(s): No evidence of valvular vegetations on this transthoracic echocardiogram. Consider a transesophageal echocardiogram to exclude infective endocarditis if clinically indicated. FINDINGS  Left Ventricle: Left ventricular ejection fraction, by estimation, is 70 to 75%. The left ventricle has hyperdynamic function. The left ventricle has no regional wall motion abnormalities. The left ventricular internal cavity size was normal in size.  There is no left ventricular hypertrophy. Left ventricular diastolic parameters are consistent with Grade I diastolic dysfunction (impaired relaxation). Right Ventricle: The right ventricular size is normal. No increase in right ventricular wall thickness. Right ventricular systolic function is normal. Left Atrium: Left atrial size was normal in size. Right Atrium: Right atrial size was normal in size. Pericardium: There is no evidence of pericardial effusion. Mitral Valve: The mitral valve is normal in structure. No evidence of mitral valve regurgitation. Tricuspid Valve: The tricuspid valve is normal in structure. Tricuspid valve regurgitation is not demonstrated. Aortic Valve: The aortic valve is grossly  normal. Aortic valve regurgitation is not visualized. Aortic valve mean gradient measures 8.0 mmHg. Aortic valve peak gradient measures 13.7 mmHg. Aortic valve area, by VTI measures 4.02 cm. Pulmonic Valve: The pulmonic valve was not well visualized. Pulmonic valve regurgitation is not visualized. Aorta: Aortic dilatation noted. There is mild dilatation of the aortic root, measuring 41 mm. Venous: The inferior vena cava was not well visualized. IAS/Shunts: No atrial level shunt detected by color flow Doppler.  LEFT VENTRICLE PLAX 2D LVIDd:         4.41 cm   Diastology LVIDs:         2.81 cm   LV e' medial:    7.29 cm/s LV PW:         1.16 cm   LV E/e' medial:  10.8 LV IVS:        1.27 cm   LV e' lateral:   7.83 cm/s LVOT diam:     2.30 cm   LV E/e' lateral: 10.0 LV SV:         96 LV SV Index:   51 LVOT Area:     4.15 cm  RIGHT VENTRICLE RV S prime:     19.70 cm/s LEFT ATRIUM             Index        RIGHT ATRIUM           Index LA diam:        3.40 cm 1.80 cm/m   RA Area:     20.40 cm LA Vol (A2C):   52.1 ml 27.59 ml/m  RA Volume:   64.80 ml  34.32 ml/m LA Vol (A4C):   57.3 ml 30.35 ml/m LA Biplane Vol: 55.3 ml 29.29 ml/m  AORTIC VALVE                     PULMONIC VALVE AV Area (Vmax):    3.77 cm      PV Vmax:        1.52 m/s AV Area (Vmean):   3.59 cm      PV Vmean:       106.000 cm/s AV Area (VTI):     4.02 cm      PV VTI:         0.236 m AV Vmax:           185.00 cm/s   PV Peak grad:   9.2 mmHg AV Vmean:          125.000 cm/s  PV Mean grad:   5.0 mmHg AV VTI:            0.240 m       RVOT Peak grad: 10 mmHg AV Peak Grad:      13.7 mmHg AV Mean Grad:      8.0 mmHg LVOT Vmax:  168.00 cm/s LVOT Vmean:        108.000 cm/s LVOT VTI:          0.232 m LVOT/AV VTI ratio: 0.97  AORTA Ao Root diam: 3.83 cm MITRAL VALVE                TRICUSPID VALVE MV Area (PHT): 4.80 cm     TR Peak grad:   14.3 mmHg MV Decel Time: 158 msec     TR Vmax:        189.00 cm/s MV E velocity: 78.40 cm/s MV A velocity: 110.00  cm/s  SHUNTS MV E/A ratio:  0.71         Systemic VTI:  0.23 m                             Systemic Diam: 2.30 cm                             Pulmonic VTI:  0.183 m Kate Sable MD Electronically signed by Kate Sable MD Signature Date/Time: 12/01/2020/3:53:46 PM    Final    US Abdomen Limited RUQ (LIVER/GB)  Result Date: 12/01/2020 CLINICAL DATA:  Abnormal LFTs EXAM: ULTRASOUND ABDOMEN LIMITED RIGHT UPPER QUADRANT COMPARISON:  CT from 11/30/2020 FINDINGS: Gallbladder: Gallbladder is well distended with evidence of gallbladder sludge. No definitive cholelithiasis is seen. No wall thickening or pericholecystic fluid is noted. Negative sonographic Murphy's sign is elicited. Common bile duct: Diameter: 5.3 mm. Liver: No focal lesion identified. Within normal limits in parenchymal echogenicity. Portal vein is patent on color Doppler imaging with normal direction of blood flow towards the liver. Other: Note is made of known right renal stones without obstructing change. IMPRESSION: Gallbladder sludge without evidence cholelithiasis. Nonobstructing right renal calculi similar to that seen on prior CT. Electronically Signed   By: Inez Catalina M.D.   On: 12/01/2020 15:29   DG Lumbar Puncture Fluoro Guide  Result Date: 11/30/2020 CLINICAL DATA:  Altered mental status with neck pain and back pain concern for meningitis request received for lumbar puncture. EXAM: DIAGNOSTIC LUMBAR PUNCTURE UNDER FLUOROSCOPIC GUIDANCE COMPARISON:  CT head imaging report reviewed prior to the procedure performed same day. FLUOROSCOPY TIME:  Fluoroscopy Time:  0.2 minute Radiation Exposure Index (if provided by the fluoroscopic device): 1.10 mGy Number of Acquired Spot Images: 1 PROCEDURE: Informed consent was obtained from the patient's wife prior to the procedure, including potential complications of bleeding, infection, CSF leak and need for additional procedures, inability to perform the procedure if patient is unable to  cooperate, nerve damage, headache, allergy, and pain. With the patient prone, the lower back was prepped with Betadine. 1% Lidocaine was used for local anesthesia. Lumbar puncture was attempted to be performed at the L4-L5 level using a 22 gauge needle, however the patient was unable to tolerate the procedure or lie flat, the needle was removed in its entirety and the procedure was aborted. No immediate complications, sterile dressing applied. IMPRESSION: Unsuccessful attempt at fluoroscopic guided lumbar puncture secondary to patient's inability to tolerate the procedure, procedure was aborted for safety reasons. The ordering provider was contacted regarding the inability to perform the procedure. Read By: Tsosie Billing PA-C Electronically Signed   By: Maurine Simmering M.D.   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 Patient is on vancomycin  and ceftaroline.  The latter was added because of the intensity of his infection and to await MIC as well as therapeutic levels of vancomycin. Repeat blood culture has been sent Patient will need minimum 6 weeks of IV antibiotic.  Cervical epidural abscess- C2-C5- underwent right hemilaminectomy C2-C4 washout of the abscess.  Cultures have been sent on the results of gram-positive cocci.  Bilateral pulmonary nodules.  Likely septic emboli.  2D echo done  Thrombocytopenia.  Likely related to infection stable  Encephalopathy has resolved  AKI has resolved.  Discussed the management with the patient and his wife in great detail.

## 2020-12-02 NOTE — Progress Notes (Addendum)
    Attending Progress Note  History: Caleb Fisher is a 45 y.o male presenting with AMS and sepsis. Blood cultures showed MRSA bacteremia. 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 Pt underwent C2-4 right hemilaminectomies for evacuation of epidural on 12/01/20  POD1: Pt remained intubated overnight for agitation.  Physical Exam: Vitals:   12/02/20 0800 12/02/20 0808  BP: (!) 142/93   Pulse: (!) 102   Resp: 16   Temp:    SpO2: 97% 96%     CNI Intubated but arouses to voice  Follows commands x 4. Moves all extremities against gravity. HV drain in place with scant output.  Data:  Recent Labs  Lab 12/01/20 0157 12/01/20 1047 12/02/20 0327  NA 137 136 139  K 2.4* 2.3* 2.6*  CL 103 102 100  CO2 23 23 26   BUN 38* 34* 36*  CREATININE 1.15 1.01 0.76  GLUCOSE 86 83 171*  CALCIUM 8.2* 8.1* 8.1*   Recent Labs  Lab 12/02/20 0327  AST 40  ALT 32  ALKPHOS 107     Recent Labs  Lab 12/01/20 0157 12/01/20 1047 12/02/20 0327  WBC 13.0* 15.6* 15.2*  HGB 11.3* 11.0* 11.3*  HCT 32.6* 31.8* 32.6*  PLT 80* 104* 92*   Recent Labs  Lab 12/01/20 0147 12/01/20 1047 12/02/20 0327  APTT 41* 40* 33  INR 1.3* 1.4* 1.2         Other tests/results:  MRI C-spine IMPRESSION: 1. 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 2. No spinal cord signal change. 3. Small prevertebral effusion from C2-C5. 4. No acute intracranial abnormality. 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   Electronically Signed: By: Ulyses Jarred M.D. On: 11/30/2020 21:51    Assessment/Plan:  Caleb Fisher is a 45 y.o presenting with MRSA bacteremia and a cervical epidural abscess status post C2-4 right hemilaminectomies for evacuation of epidural on 12/01/20.  - interoperative cultures show: Few gram positive cocci - ABX  coverage per ID recommendations - Plan to work towards extubation. - no activity restrictions from a neurosurgical standpoint - Continue to follow Hemovac output. - Resume every hour neurochecks after extubation - Ok to start DVT ppx from neurosurgical standpoint - Remainder of plan per critical care recommendations.  Cooper Render PA-C Department of Neurosurgery

## 2020-12-02 NOTE — Progress Notes (Signed)
OT Cancellation Note  Patient Details Name: Caleb Fisher MRN: [id number] DOB: 10/11/1975   Cancelled Treatment:    Reason Eval/Treat Not Completed: Medical issues which prohibited therapy. Chart reviewed - pt noted to have K+ critically low at 2.6; contraindicated for exertional activity at this time. MD requesting to hold this date. Will continue to follow and initiate services as pt medically appropriate to participate in therapy.    Dessie Coma, M.S. OTR/L  12/02/20, 10:32 AM  ascom [phone]

## 2020-12-02 NOTE — Progress Notes (Signed)
Patient extubated this morning to 2l [place], doing well, alert and oriented to place, time, person, able to move all 4 extremities and following commands.  JP drain minimal output. Precedex gtt stopped, pt remains calm and cooperative. Will continue to monitor.

## 2020-12-02 NOTE — Progress Notes (Signed)
Pharmacy Antibiotic Note  Caleb Fisher is a 45 y.o. 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  Pharmacy has been consulted for Vancomycin dosing.  Patient presented with severe neck pain and confusion. No apparent PMH other than chronic back pain. MRI visualized cervical abscess of neck and spine at C2-C5. CT with bilateral pulmonary nodules, likely septic emboli.   Added antistaphylococcal beta lactam, ceftaroline, while awaiting MIC of MRSA d/t concern for high bioburden. Drainage of abscess performed 11/16. TTE showing left ventricular hyperdynamic function.   Renal function has improved over last 24 hours (Scr 1.01 > 0.76). Febrile with TMAX 101.1  Plan: Continue vancomycin 1000 mg IV every 12 hours Goal AUC 400-550. Expected AUC: 472.3 Cmin: 11.8 SCr used: 0.8 Ordered vancomycin peak tonight and vancomycin trough in the AM  Also on ceftaroline 600 mg every 8 hours as above.  F/u TEE. Monitor culture results, renal function, LOT.  Height: 5\' 9"  (175.3 cm) Weight: 73.4 kg (161 lb 13.1 oz) IBW/kg (Calculated) : 70.7  Temp (24hrs), Avg:98.9 F (37.2 C), Min:97.9 F (36.6 C), Max:101.1 F (38.4 C)  Recent Labs  Lab 11/30/20 1310 11/30/20 1643 11/30/20 2237 12/01/20 0030 12/01/20 0147 12/01/20 0157 12/01/20 1047 12/02/20 0327  WBC 14.2*  --   --  8.8  --  13.0* 15.6* 15.2*  CREATININE 1.58*  --   --   --   --  1.15 1.01 0.76  LATICACIDVEN 2.0* 2.2* 1.7  --  1.9  --   --   --      Estimated Creatinine Clearance: 115.4 mL/min (by C-G formula based on SCr of 0.76 mg/dL).    No Known Allergies  Antimicrobials this admission: 11/15 Cefepime >> x 2 doses 11/15 Flagyl >>  11/15 Vancomycin >>  Microbiology results: 11/15 BCx: 4 of 4 bottle w/ MRSA 11/15 CSFCx: Pending  11/16 Epidural absces: few GPCs 11/16 wound: few GPCs   Thank you for allowing pharmacy to be a part of this patient's care.    Wynelle Cleveland,  PharmD Pharmacy Resident  12/02/2020 9:10 AM

## 2020-12-02 NOTE — Progress Notes (Signed)
[facility]  After further discussion with patient he states that he takes percocet tablets at home, up to 7-8 per day.  Continues to deny IV drug use.  He also drinks 2-3 beers a day. Notes significant chronic pain. Discussed with pharmacy Will schedule acetaminophen, add prn ketorolac. Schedule oxycodone to help reduce withdrawal. Use morphine prn in hospital to help with breakthrough pain. Consider suboxone at some point near hospital discharge. Will need outpatient pain management referral after discharge.  Roselie Awkward, MD [place] [facility] Pager: [phone] Cell: [phone] After 7:00 pm call Elink  [phone]

## 2020-12-02 NOTE — Progress Notes (Signed)
Patient was extubated per doctors order. Patient extubated to 2L nasal cannula. No issues with extubation, no stridor present. Patient talking and joking. Wife at bedside. Patient's oxygen saturation on 2L is 96%.

## 2020-12-02 NOTE — Progress Notes (Signed)
PT Cancellation Note  Patient Details Name: Caleb Fisher MRN: [id number] DOB: Dec 26, 1975   Cancelled Treatment:    Reason Eval/Treat Not Completed: Other (comment). Consult received and chart reviewed. Patient noted with critically low K+ (2.6) , per PT practice guidelines contraindicated for exertional activity at this time, confirmed hold with MD at this time. Will continue efforts next date pending medical stability and appropriateness.  Lieutenant Diego PT, DPT 10:28 AM,12/02/20

## 2020-12-02 NOTE — Progress Notes (Addendum)
PROGRESS NOTE    Caleb Fisher   LPF:[id number]  DOB: [birth year]  PCP: Pcp, No    DOA: 11/30/2020 LOS: 2    Brief Narrative / Hospital Course to Date:   45 year old male with no past medical history other than tobacco abuse, presented to the ED from home on 11/30/2020 with altered mental status, increased back pain and new onset neck pain, and frequent falls.  Evaluation in the ED was consistent with severe sepsis from unclear source, with tachycardia and tachypnea.  Subsequently found to have MRSA bacteremia.  LP was attempted but unable to be completed due to patient agitation and unable to lie still.  Further imaging of the spine revealed a cervical epidural abscess involving C2-C5.  Started on empiric broad-spectrum antibiotics.  Labs also notable for profound hypokalemia, thrombocytopenia.  Admitted to [facility] service with infectious disease, neurosurgery, neurology and PCCM consulted.  Transferred to ICU.  Assessment & Plan   Active Problems:   Encephalopathy acute   Altered mental status   MRSA bacteremia   Abscess in epidural space of cervical spine   Hypertensive urgency   Hypokalemia   Thrombocytopenia (HCC)   Acute metabolic encephalopathy   Severe sepsis (HCC)   Severe sepsis -POA as evidenced by tachycardia, tachypnea, fevers, acute encephalopathy consistent with organ dysfunction and severe sepsis. 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  Opiate withdrawal. LP  was unable to be obtained after multiple attempts, due to patient agitation and inability to lay still. -- Treat infection and other underlying conditions as outlined --Aspiration precautions --Delirium precautions --11/17: On Precedex for sedation per PCCM  Thrombocytopenia -likely due to sepsis, DIC is a concern. Has received platelet transfusions. -- Hematology consulted  Acute kidney injury -likely due to dehydration, possibly NSAID use. Renal function improving with IV fluids. --Monitor BMP  Hypokalemia -refractory.  K continues to be replaced. With hypertension as well, consider hyperaldosteronism --Check urine potassium creatinine --Check aldosterone and renin activity ratio --Further potassium placement and fluids and riders until taking oral safely --Monitor BMP, replace as needed  Hypertension with hypertensive urgency - --Currently on Cardene drip --Per neurosurgery, goal MAP is 70-110 --Monitor BP closely  Transaminitis -with AST elevated, possibly due to shock liver versus alcohol abuse although wife reports patient seldom drinks. Right upper quadrant ultrasound showed gallbladder sludge without cholelithiasis, nonobstructing right renal calculi similar to that seen on prior CT. --Follow LFTs   Patient BMI: Body mass index is 23.9 kg/m.   DVT prophylaxis: enoxaparin (LOVENOX) injection 40 mg Start: 12/02/20 2200 SCD's Start: 12/01/20 1907   Diet:  Diet Orders (From admission, onward)     Start     Ordered   12/02/20 1505  Diet regular Room service appropriate? Yes; Fluid consistency: Thin  Diet effective now       Question Answer Comment  Room service appropriate? Yes   Fluid consistency: Thin      12/02/20 1504              Code Status: Full Code   Subjective 12/02/20    Patient remained intubated  after surgery but was extubated this morning.  Currently sedated on Precedex and resting comfortably.  Nurses report when awake he is alert and  oriented x3.  No acute events reported.  Postop day 1 from neurosurgery with washout of his cervical epidural abscess.   Disposition Plan & Communication   Status is: Inpatient  Remains inpatient appropriate because: Severity of illness.  Remains on IV therapies with evaluation pending for MRSA bacteremia and cervical spinal abscess    Family Communication: Wife and best friend at bedside during encounter 11/16.   Consults, Procedures, Significant Events   Consultants:  Infectious disease Neurosurgery PCCM Neurology Hematology/oncology  Procedures:  Washout of cervical spinal epidural abscess  Antimicrobials:  Anti-infectives (From admission, onward)    Start     Dose/Rate Route Frequency Ordered Stop   12/01/20 1630  ceftaroline (TEFLARO) 600 mg in sodium chloride 0.9 % 100 mL IVPB        600 mg 100 mL/hr over 60 Minutes Intravenous Every 8 hours 12/01/20 1533     12/01/20 0800  cefTRIAXone (ROCEPHIN) 2 g in sodium chloride 0.9 % 100 mL IVPB  Status:  Discontinued        2 g 200 mL/hr over 30 Minutes Intravenous Every 24 hours 11/30/20 2311 12/01/20 0032   12/01/20 0700  vancomycin (VANCOREADY) IVPB 1000 mg/200 mL        1,000 mg 200 mL/hr over 60 Minutes Intravenous Every 12 hours 11/30/20 2319     12/01/20 0700  ceFEPIme (MAXIPIME) 2 g in sodium chloride 0.9 % 100 mL IVPB  Status:  Discontinued        2 g 200 mL/hr over 30 Minutes Intravenous Every 8 hours 12/01/20 0054 12/01/20 0128   12/01/20 0618  metroNIDAZOLE (FLAGYL) IVPB 500 mg  Status:  Discontinued        500 mg 100 mL/hr over 60 Minutes Intravenous Every 6 hours 12/01/20 0031 12/01/20 1237   12/01/20 0600  cefTRIAXone (ROCEPHIN) 2 g in sodium chloride 0.9 % 100 mL IVPB  Status:  Discontinued        2 g 200 mL/hr over 30 Minutes Intravenous Every 12 hours 12/01/20 0035 12/01/20 0049   12/01/20 0045  cefTRIAXone (ROCEPHIN) 2 g in sodium chloride 0.9 % 100 mL IVPB  Status:  Discontinued        2 g 200 mL/hr  over 30 Minutes Intravenous Every 12 hours 12/01/20 0032 12/01/20 0035   12/01/20 0000  metroNIDAZOLE (FLAGYL) IVPB 500 mg  Status:  Discontinued        500 mg 100 mL/hr over 60 Minutes Intravenous Every 12 hours 11/30/20 2311 12/01/20 0031   11/30/20 2300  ceFEPIme (MAXIPIME) 2 g in sodium chloride 0.9 % 100 mL IVPB  Status:  Discontinued        2 g 200 mL/hr over 30 Minutes Intravenous Every 8 hours 11/30/20 1816 12/01/20 0000   11/30/20 1930  acyclovir (ZOVIRAX) 725 mg in dextrose 5 % 150 mL IVPB  Status:  Discontinued        10 mg/kg  72.6 kg 164.5 mL/hr over 60 Minutes Intravenous Every 8 hours 11/30/20 1915 12/01/20 0032   11/30/20 1500  vancomycin (VANCOREADY) IVPB 1750 mg/350 mL        1,750 mg 175 mL/hr over 120 Minutes Intravenous  Once 11/30/20 1443 11/30/20 2200   11/30/20 1445  ceFEPIme (MAXIPIME) 2 g in sodium chloride 0.9 % 100 mL IVPB        2  g 200 mL/hr over 30 Minutes Intravenous  Once 11/30/20 1436 11/30/20 1728   11/30/20 1445  metroNIDAZOLE (FLAGYL) IVPB 500 mg        500 mg 100 mL/hr over 60 Minutes Intravenous  Once 11/30/20 1436 11/30/20 1835   11/30/20 1445  vancomycin (VANCOCIN) IVPB 1000 mg/200 mL premix  Status:  Discontinued        1,000 mg 200 mL/hr over 60 Minutes Intravenous  Once 11/30/20 1436 11/30/20 1443         Micro    Objective   Vitals:   12/02/20 1100 12/02/20 1156 12/02/20 1200 12/02/20 1300  BP: (!) 138/91 (!) 144/95 (!) 137/92 (!) 143/90  Pulse: 91  96 93  Resp: _0 Temp:  98.4 F (36.9 C)    TempSrc:  Oral    SpO2: 95%  95% 94%  Weight:      Height:        Intake/Output Summary (Last 24 hours) at 12/02/2020 1614 Last data filed at 12/02/2020 1531 Gross per 24 hour  Intake 4424.09 ml  Output 2950 ml  Net 1474.09 ml   Filed Weights   11/30/20 1311 11/30/20 2216  Weight: 72.6 kg 73.4 kg    Physical Exam:  General exam: sedating, resting comfortably, no acute distress Respiratory system: Lungs clear  bilaterally, normal respiratory effort, on 2 L/min nasal cannula oxygen. Cardiovascular system: normal S1/S2, regular rate and rhythm, no pedal edema.   Central nervous system: Unable to evaluate as patient currently sedated Extremities: Mittens on bilateral hands, no edema, normal tone Skin: dry, intact, normal temperature Psychiatry: Unable to assess due to sedation  Labs   Data Reviewed: I have personally reviewed following labs and imaging studies  CBC: Recent Labs  Lab 11/30/20 1310 12/01/20 0030 12/01/20 0147 12/01/20 0157 12/01/20 1047 12/02/20 0327  WBC 14.2* 8.8  --  13.0* 15.6* 15.2*  NEUTROABS 12.8*  --   --   --  14.0* 13.5*  HGB 12.8* 9.8*  --  11.3* 11.0* 11.3*  HCT 36.6* 28.1*  --  32.6* 31.8* 32.6*  MCV 85.1 85.9  --  83.8 84.4 84.9  PLT 125* NO PLT 77* 80* 104* 92*   Basic Metabolic Panel: Recent Labs  Lab 11/30/20 1310 12/01/20 0157 12/01/20 1047 12/02/20 0327 12/02/20 1309  NA 135 137 136 139  --   K 3.0* 2.4* 2.3* 2.6* 2.9*  CL 99 103 102 100  --   CO2 _1 --   GLUCOSE 117* 86 83 171*  --   BUN 54* 38* 34* 36*  --   CREATININE 1.58* 1.15 1.01 0.76  --   CALCIUM 8.4* 8.2* 8.1* 8.1*  --   MG 2.4  --   --  2.3  --    GFR: Estimated Creatinine Clearance: 115.4 mL/min (by C-G formula based on SCr of 0.76 mg/dL). Liver Function Tests: Recent Labs  Lab 11/30/20 1310 12/01/20 0157 12/02/20 0327  AST 102* 97* 40  ALT 55* 49* 32  ALKPHOS 166* 129* 107  BILITOT 1.1 1.5* 1.4*  PROT 6.5 5.3* 5.2*  ALBUMIN 2.9* 2.2* 1.9*   No results for input(s): LIPASE, AMYLASE in the last 168 hours. Recent Labs  Lab 11/30/20 1454  AMMONIA 14   Coagulation Profile: Recent Labs  Lab 11/30/20 1310 12/01/20 0147 12/01/20 1047 12/02/20 0327  INR 1.2 1.3* 1.4* 1.2   Cardiac Enzymes: No results for input(s): CKTOTAL, CKMB, CKMBINDEX, TROPONINI in the  last 168 hours. BNP (last 3 results) No results for input(s): PROBNP in the last 8760  hours. HbA1C: Recent Labs    12/01/20 0157  HGBA1C 5.8*   CBG: Recent Labs  Lab 12/01/20 1131  GLUCAP 97   Lipid Profile: Recent Labs    12/01/20 0157 12/02/20 0327  CHOL 75  --   HDL <10*  --   LDLCALC NOT CALCULATED  --   TRIG 147 198*  CHOLHDL NOT CALCULATED  --    Thyroid Function Tests: Recent Labs    11/30/20 1310 12/01/20 0157  TSH 0.267*  --   FREET4  --  1.31*   Anemia Panel: No results for input(s): VITAMINB12, FOLATE, FERRITIN, TIBC, IRON, RETICCTPCT in the last 72 hours. Sepsis Labs: Recent Labs  Lab 11/30/20 1310 11/30/20 1643 11/30/20 2237 12/01/20 0147 12/01/20 0157  PROCALCITON 17.28  --   --   --  22.89  LATICACIDVEN 2.0* 2.2* 1.7 1.9  --     Recent Results (from the past 240 hour(s))  Blood Culture (routine x 2)     Status: Abnormal (Preliminary result)   Collection Time: 11/30/20  2:54 PM   Specimen: BLOOD  Result Value Ref Range Status   Specimen Description   Final    BLOOD LEFT ANTECUBITAL Performed at [facility], [address] [place], [place] [address]    Special Requests   Final    BOTTLES DRAWN AEROBIC AND ANAEROBIC Blood Culture results may not be optimal due to an excessive volume of blood received in culture bottles Performed at [facility], [address] [place], [place] [address]    Culture  Setup Time   Final    Organism ID to follow [place] CRITICAL RESULT CALLED TO, READ BACK BY AND VERIFIED WITH: NATHAN BELUE _0  ON 12/01/20 SKL    Culture (A)  Final    STAPHYLOCOCCUS AUREUS SUSCEPTIBILITIES TO FOLLOW Performed at [facility], [place] [address], [place], [place] [address]    Report Status PENDING  Incomplete  Blood Culture (routine x 2)     Status: Abnormal (Preliminary result)   Collection Time: 11/30/20  2:54 PM   Specimen: BLOOD  Result Value Ref Range Status   Specimen Description   Final    BLOOD BLOOD RIGHT  HAND Performed at [facility], [address] [place], [place] [address]    Special Requests   Final    BOTTLES DRAWN AEROBIC AND ANAEROBIC Blood Culture adequate volume Performed at [facility], [address] [place], [place] [address]    Culture  Setup Time   Final    GRAM POSITIVE COCCI IN BOTH AEROBIC AND ANAEROBIC BOTTLES CRITICAL VALUE NOTED.  VALUE IS CONSISTENT WITH PREVIOUSLY REPORTED AND CALLED VALUE.    Culture STAPHYLOCOCCUS AUREUS (A)  Final   Report Status PENDING  Incomplete  Resp Panel by RT-PCR (Flu A&B, Covid) Nasopharyngeal Swab     Status: None   Collection Time: 11/30/20  2:54 PM   Specimen: Nasopharyngeal Swab; Nasopharyngeal(NP) swabs in vial transport medium  Result Value Ref Range Status   SARS Coronavirus 2 by RT PCR NEGATIVE NEGATIVE Final    Comment: (NOTE) SARS-CoV-2 target nucleic acids are NOT DETECTED.  The SARS-CoV-2 RNA is generally detectable in upper respiratory specimens during the acute phase of infection. The lowest concentration of SARS-CoV-2 viral copies this assay can detect is 138 copies/mL. A negative result does not  preclude SARS-Cov-2 infection and should not be used as the sole basis for treatment or other patient management decisions. A negative result may occur with  improper specimen collection/handling, submission of specimen other than nasopharyngeal swab, presence of viral mutation(s) within the areas targeted by this assay, and inadequate number of viral copies(<138 copies/mL). A negative result must be combined with clinical observations, patient history, and epidemiological information. The expected result is Negative.  Fact Sheet for Patients:  EntrepreneurPulse.com.au  Fact Sheet for Healthcare Providers:  IncredibleEmployment.be  This test is no t yet approved or cleared by the [place] FDA and  has been authorized for detection and/or diagnosis of  SARS-CoV-2 by FDA under an Emergency Use Authorization (EUA). This EUA will remain  in effect (meaning this test can be used) for the duration of the COVID-19 declaration under Section 564(b)(1) of the Act, 21 U.S.C.section 360bbb-3(b)(1), unless the authorization is terminated  or revoked sooner.       Influenza A by PCR NEGATIVE NEGATIVE Final   Influenza B by PCR NEGATIVE NEGATIVE Final    Comment: (NOTE) The Xpert Xpress SARS-CoV-2/FLU/RSV plus assay is intended as an aid in the diagnosis of influenza from Nasopharyngeal swab specimens and should not be used as a sole basis for treatment. Nasal washings and aspirates are unacceptable for Xpert Xpress SARS-CoV-2/FLU/RSV testing.  Fact Sheet for Patients: EntrepreneurPulse.com.au  Fact Sheet for Healthcare Providers: IncredibleEmployment.be  This test is not yet approved or cleared by the [place] FDA and has been authorized for detection and/or diagnosis of SARS-CoV-2 by FDA under an Emergency Use Authorization (EUA). This EUA will remain in effect (meaning this test can be used) for the duration of the COVID-19 declaration under Section 564(b)(1) of the Act, 21 U.S.C. section 360bbb-3(b)(1), unless the authorization is terminated or revoked.  Performed at [facility], [address] [place], [place] [address]   Blood Culture ID Panel (Reflexed)     Status: Abnormal   Collection Time: 11/30/20  2:54 PM  Result Value Ref Range Status   Enterococcus faecalis NOT DETECTED NOT DETECTED Final   Enterococcus Faecium NOT DETECTED NOT DETECTED Final   Listeria monocytogenes NOT DETECTED NOT DETECTED Final   Staphylococcus species DETECTED (A) NOT DETECTED Final    Comment: CRITICAL RESULT CALLED TO, READ BACK BY AND VERIFIED WITH: NATHAN BELUE _0  ON 12/01/20 SKL    Staphylococcus aureus (BCID) DETECTED (A) NOT DETECTED Final    Comment: Methicillin (oxacillin)-resistant  Staphylococcus aureus (MRSA). MRSA is predictably resistant to beta-lactam antibiotics (except ceftaroline). Preferred therapy is vancomycin unless clinically contraindicated. Patient requires contact precautions if  hospitalized. CRITICAL RESULT CALLED TO, READ BACK BY AND VERIFIED WITH: NATHAN BELUE _1  ON 12/01/20 SKL    Staphylococcus epidermidis NOT DETECTED NOT DETECTED Final   Staphylococcus lugdunensis NOT DETECTED NOT DETECTED Final   Streptococcus species NOT DETECTED NOT DETECTED Final   Streptococcus agalactiae NOT DETECTED NOT DETECTED Final   Streptococcus pneumoniae NOT DETECTED NOT DETECTED Final   Streptococcus pyogenes NOT DETECTED NOT DETECTED Final   A.calcoaceticus-baumannii NOT DETECTED NOT DETECTED Final   Bacteroides fragilis NOT DETECTED NOT DETECTED Final   Enterobacterales NOT DETECTED NOT DETECTED Final   Enterobacter cloacae complex NOT DETECTED NOT DETECTED Final   Escherichia coli NOT DETECTED NOT DETECTED Final   Klebsiella aerogenes NOT DETECTED NOT DETECTED Final   Klebsiella oxytoca NOT DETECTED NOT DETECTED Final   Klebsiella pneumoniae NOT DETECTED NOT DETECTED Final   Proteus species NOT DETECTED NOT  DETECTED Final   Salmonella species NOT DETECTED NOT DETECTED Final   Serratia marcescens NOT DETECTED NOT DETECTED Final   Haemophilus influenzae NOT DETECTED NOT DETECTED Final   Neisseria meningitidis NOT DETECTED NOT DETECTED Final   Pseudomonas aeruginosa NOT DETECTED NOT DETECTED Final   Stenotrophomonas maltophilia NOT DETECTED NOT DETECTED Final   Candida albicans NOT DETECTED NOT DETECTED Final   Candida auris NOT DETECTED NOT DETECTED Final   Candida glabrata NOT DETECTED NOT DETECTED Final   Candida krusei NOT DETECTED NOT DETECTED Final   Candida parapsilosis NOT DETECTED NOT DETECTED Final   Candida tropicalis NOT DETECTED NOT DETECTED Final   Cryptococcus neoformans/gattii NOT DETECTED NOT DETECTED Final   Meth resistant mecA/C and  MREJ DETECTED (A) NOT DETECTED Final    Comment: CRITICAL RESULT CALLED TO, READ BACK BY AND VERIFIED WITH: NATHAN BELUE _0  ON 12/01/20 SKL Performed at [facility], [address], [place], [place] [address]   Urine Culture     Status: None   Collection Time: 12/01/20  6:18 AM   Specimen: Urine, Clean Catch  Result Value Ref Range Status   Specimen Description   Final    URINE, CLEAN CATCH Performed at [facility], [address] [place], [place] [address]    Special Requests   Final    NONE Performed at [facility], [address], [place], [place] [address]    Culture   Final    NO GROWTH Performed at [facility], [place] [address], [place], [place] [address]    Report Status 12/02/2020 FINAL  Final  MRSA Next Gen by PCR, Nasal     Status: Abnormal   Collection Time: 12/01/20  8:40 AM   Specimen: Nasal Mucosa; Nasal Swab  Result Value Ref Range Status   MRSA by PCR Next Gen DETECTED (A) NOT DETECTED Final    Comment: RESULT CALLED TO, READ BACK BY AND VERIFIED WITH: CHARLIE FLEETWOOD 12/01/2020 1447 JGF Performed at [facility], [place], [place] [address]   Aerobic Culture w Gram Stain (superficial specimen)     Status: None (Preliminary result)   Collection Time: 12/01/20 12:44 PM   Specimen: Wound  Result Value Ref Range Status   Specimen Description   Final    WOUND Performed at [facility], [address], [place], [place] [address]    Special Requests   Final    NONE Performed at [facility], [address] [place], [place] [address]    Gram Stain   Final    RARE WBC PRESENT, PREDOMINANTLY MONONUCLEAR RARE GRAM POSITIVE COCCI    Culture   Final    CULTURE REINCUBATED FOR BETTER GROWTH Performed at [facility], [place] [address] [place], [place] [address]    Report Status PENDING  Incomplete  Aerobic/Anaerobic Culture w Gram Stain (surgical/deep wound)     Status:  None (Preliminary result)   Collection Time: 12/01/20  5:55 PM   Specimen: PATH Cytology Misc. fluid; Body Fluid  Result Value Ref Range Status   Specimen Description   Final    WOUND EPIDURALABSCESS Performed at [facility], [address], [place],  [address]    Special Requests   Final    NONE Performed at [facility], [place], [place] [address]    Gram Stain   Final    NO SQUAMOUS EPITHELIAL CELLS SEEN FEW WBC SEEN FEW GRAM POSITIVE  COCCI    Culture   Final    CULTURE REINCUBATED FOR BETTER GROWTH Performed at [facility], [place] [address], [place], [place] [address]    Report Status PENDING  Incomplete      Imaging Studies   DG Cervical Spine 2-3 Views  Result Date: 12/01/2020 CLINICAL DATA:  Epidural abscess EXAM: CERVICAL SPINE - 2-3 VIEW; DG C-ARM 1-60 MIN COMPARISON:  MRI cervical spine 11/30/2020 FINDINGS: AP and lateral C-arm images were obtained of the cervical spine. The lateral view, there are surgical instruments posteriorly at C2-3. Instruments are posterior to the spinous processes of C2 and C3. No skeletal abnormality. IMPRESSION: Surgical localization C2-3 posteriorly. Electronically Signed   By: Franchot Gallo M.D.   On: 12/01/2020 18:10   MR BRAIN W WO CONTRAST  Addendum Date: 11/30/2020   ADDENDUM REPORT: 11/30/2020 22:08 ADDENDUM: These results were called by telephone at the time of interpretation on 11/30/2020 at 10:08 pm to provider [facility] , who verbally acknowledged these results. Electronically Signed   By: Ulyses Jarred M.D.   On: 11/30/2020 22:08   Result Date: 11/30/2020 CLINICAL DATA:  Neck pain and altered mental status EXAM: MRI HEAD WITHOUT AND WITH CONTRAST MRI CERVICAL SPINE WITHOUT AND WITH CONTRAST TECHNIQUE: Multiplanar, multiecho pulse sequences of the brain and surrounding structures, and cervical spine, to include the craniocervical junction and cervicothoracic junction, were  obtained without and with intravenous contrast. CONTRAST:  7 mL Gadavist COMPARISON:  None. FINDINGS: MRI HEAD FINDINGS Brain: No acute infarct, mass effect or extra-axial collection. No acute or chronic hemorrhage. There is multifocal hyperintense T2-weighted signal within the white matter. Parenchymal volume and CSF spaces are normal. 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 The midline structures are normal. Vascular: Major flow voids are preserved. Skull and upper cervical spine: Normal calvarium and skull base. Visualized upper cervical spine and soft tissues are normal. Sinuses/Orbits:No paranasal sinus fluid levels or advanced mucosal thickening. No mastoid or middle ear effusion. Normal orbits. MRI CERVICAL SPINE FINDINGS Alignment: Physiologic. Vertebrae: No fracture, evidence of discitis, or bone lesion. Cord: Spinal cord itself is normal, but there is a circumferential collection the length the cervical spine with peripheral contrast enhancement. The thickness of the collection is greatest at the C2 and C3 levels, measuring up to 6 mm in the 7 o'clock position of the spinal canal. Posterior Fossa, vertebral arteries, paraspinal tissues: Small prevertebral effusion from C2-C5, measuring 7 mm in thickness. Disc levels: There is mild multilevel degenerative disc disease, greatest at C5-6. There is no bony spinal canal stenosis. The CSF space is effaced along the entire length of the cervical spine due to the above described epidural collection. IMPRESSION: 1. 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 2. No spinal cord signal change. 3. Small prevertebral effusion from C2-C5. 4. No acute intracranial abnormality. 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 Electronically Signed: By: Ulyses Jarred M.D. On: 11/30/2020 21:51   MR CERVICAL  SPINE W WO CONTRAST  Addendum Date: 11/30/2020   ADDENDUM REPORT: 11/30/2020 22:08 ADDENDUM: These results were called by telephone at the time of interpretation on 11/30/2020 at 10:08 pm to provider [facility] , who verbally acknowledged these results. Electronically Signed   By: Ulyses Jarred M.D.   On: 11/30/2020 22:08   Result Date: 11/30/2020 CLINICAL DATA:  Neck  pain and altered mental status EXAM: MRI HEAD WITHOUT AND WITH CONTRAST MRI CERVICAL SPINE WITHOUT AND WITH CONTRAST TECHNIQUE: Multiplanar, multiecho pulse sequences of the brain and surrounding structures, and cervical spine, to include the craniocervical junction and cervicothoracic junction, were obtained without and with intravenous contrast. CONTRAST:  7 mL Gadavist COMPARISON:  None. FINDINGS: MRI HEAD FINDINGS Brain: No acute infarct, mass effect or extra-axial collection. No acute or chronic hemorrhage. There is multifocal hyperintense T2-weighted signal within the white matter. Parenchymal volume and CSF spaces are normal. 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 The midline structures are normal. Vascular: Major flow voids are preserved. Skull and upper cervical spine: Normal calvarium and skull base. Visualized upper cervical spine and soft tissues are normal. Sinuses/Orbits:No paranasal sinus fluid levels or advanced mucosal thickening. No mastoid or middle ear effusion. Normal orbits. MRI CERVICAL SPINE FINDINGS Alignment: Physiologic. Vertebrae: No fracture, evidence of discitis, or bone lesion. Cord: Spinal cord itself is normal, but there is a circumferential collection the length the cervical spine with peripheral contrast enhancement. The thickness of the collection is greatest at the C2 and C3 levels, measuring up to 6 mm in the 7 o'clock position of the spinal canal. Posterior Fossa, vertebral arteries, paraspinal tissues: Small prevertebral effusion from C2-C5, measuring 7 mm in thickness. Disc levels: There is mild  multilevel degenerative disc disease, greatest at C5-6. There is no bony spinal canal stenosis. The CSF space is effaced along the entire length of the cervical spine due to the above described epidural collection. IMPRESSION: 1. 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 2. No spinal cord signal change. 3. Small prevertebral effusion from C2-C5. 4. No acute intracranial abnormality. 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 Electronically Signed: By: Ulyses Jarred M.D. On: 11/30/2020 21:51   DG Chest Port 1 View  Result Date: 12/02/2020 CLINICAL DATA:  Acute respiratory failure with hypoxia. EXAM: PORTABLE CHEST 1 VIEW COMPARISON:  December 01, 2020. FINDINGS: Stable cardiomediastinal silhouette. Endotracheal tube is in good position. Stable mild interstitial densities are noted which may represent minimal pulmonary edema or possibly atypical inflammation, or possibly scarring. Bony thorax is unremarkable. IMPRESSION: Stable mild interstitial densities are noted bilaterally as described above. Stable support apparatus. Electronically Signed   By: Marijo Conception M.D.   On: 12/02/2020 08:11   DG Chest Port 1 View  Result Date: 12/01/2020 CLINICAL DATA:  Status post intubation. EXAM: PORTABLE CHEST 1 VIEW COMPARISON:  Chest radiograph dated 12/01/2020. FINDINGS: Endotracheal tube with tip approximately 5 cm above the carina. Bilateral interstitial prominence, left greater right. No focal consolidation, pleural effusion, pneumothorax. The cardiac silhouette is within normal limits. No acute osseous pathology. IMPRESSION: 1. Endotracheal tube above the carina. 2. Bilateral interstitial prominence. No focal consolidation. Electronically Signed   By: Anner Crete M.D.   On: 12/01/2020 20:05   DG Chest Port 1 View  Result Date:  12/01/2020 CLINICAL DATA:  Acute respiratory failure with hypoxia EXAM: PORTABLE CHEST 1 VIEW COMPARISON:  November 30, 2020. FINDINGS: Stable cardiomediastinal silhouette. Mild central pulmonary vascular congestion is noted. Increased bilateral lung opacities are noted concerning for edema or pneumonia. Bony thorax is unremarkable. IMPRESSION: Mild central pulmonary vascular congestion. Increased bilateral lung opacities are noted concerning for edema or pneumonia. Electronically Signed   By: Marijo Conception M.D.   On:  12/01/2020 09:09   DG C-Arm 1-60 Min  Result Date: 12/01/2020 CLINICAL DATA:  Epidural abscess EXAM: CERVICAL SPINE - 2-3 VIEW; DG C-ARM 1-60 MIN COMPARISON:  MRI cervical spine 11/30/2020 FINDINGS: AP and lateral C-arm images were obtained of the cervical spine. The lateral view, there are surgical instruments posteriorly at C2-3. Instruments are posterior to the spinous processes of C2 and C3. No skeletal abnormality. IMPRESSION: Surgical localization C2-3 posteriorly. Electronically Signed   By: Franchot Gallo M.D.   On: 12/01/2020 18:10   ECHOCARDIOGRAM COMPLETE  Result Date: 12/01/2020    ECHOCARDIOGRAM REPORT   Patient Name:   JOQUAN LOTZ Date of Exam: 12/01/2020 Medical Rec #:  [id number]   Height:       69.0 in Accession #:    [id number]  Weight:       161.8 lb Date of Birth:  April 12, 1975   BSA:          1.888 m Patient Age:    41 years    BP:           139/97 mmHg Patient Gender: M           HR:           112 bpm. Exam Location:  [facility] Procedure: 2D Echo, Cardiac Doppler and Color Doppler Indications:     Bacteremia R78.81  History:         Patient has no prior history of Echocardiogram examinations. No                  past medical history on file.  Sonographer:     [name] Referring Phys:  [id number] Tsosie Billing Diagnosing Phys: Kate Sable MD  Sonographer Comments: Suboptimal apical window. IMPRESSIONS  1. Left ventricular ejection fraction, by estimation, is 70 to  75%. The left ventricle has hyperdynamic function. The left ventricle has no regional wall motion abnormalities. Left ventricular diastolic parameters are consistent with Grade I diastolic dysfunction (impaired relaxation).  2. Right ventricular systolic function is normal. The right ventricular size is normal.  3. The mitral valve is normal in structure. No evidence of mitral valve regurgitation.  4. The aortic valve is grossly normal. Aortic valve regurgitation is not visualized.  5. Aortic dilatation noted. There is mild dilatation of the aortic root, measuring 41 mm. Conclusion(s)/Recommendation(s): No evidence of valvular vegetations on this transthoracic echocardiogram. Consider a transesophageal echocardiogram to exclude infective endocarditis if clinically indicated. FINDINGS  Left Ventricle: Left ventricular ejection fraction, by estimation, is 70 to 75%. The left ventricle has hyperdynamic function. The left ventricle has no regional wall motion abnormalities. The left ventricular internal cavity size was normal in size. There is no left ventricular hypertrophy. Left ventricular diastolic parameters are consistent with Grade I diastolic dysfunction (impaired relaxation). Right Ventricle: The right ventricular size is normal. No increase in right ventricular wall thickness. Right ventricular systolic function is normal. Left Atrium: Left atrial size was normal in size. Right Atrium: Right atrial size was normal in size. Pericardium: There is no evidence of pericardial effusion. Mitral Valve: The mitral valve is normal in structure. No evidence of mitral valve regurgitation. Tricuspid Valve: The tricuspid valve is normal in structure. Tricuspid valve regurgitation is not demonstrated. Aortic Valve: The aortic valve is grossly normal. Aortic valve regurgitation is not visualized. Aortic valve mean gradient measures 8.0 mmHg. Aortic valve peak gradient measures 13.7 mmHg. Aortic valve area, by VTI measures  4.02 cm. Pulmonic Valve: The pulmonic valve was not well  visualized. Pulmonic valve regurgitation is not visualized. Aorta: Aortic dilatation noted. There is mild dilatation of the aortic root, measuring 41 mm. Venous: The inferior vena cava was not well visualized. IAS/Shunts: No atrial level shunt detected by color flow Doppler.  LEFT VENTRICLE PLAX 2D LVIDd:         4.41 cm   Diastology LVIDs:         2.81 cm   LV e' medial:    7.29 cm/s LV PW:         1.16 cm   LV E/e' medial:  10.8 LV IVS:        1.27 cm   LV e' lateral:   7.83 cm/s LVOT diam:     2.30 cm   LV E/e' lateral: 10.0 LV SV:         96 LV SV Index:   51 LVOT Area:     4.15 cm  RIGHT VENTRICLE RV S prime:     19.70 cm/s LEFT ATRIUM             Index        RIGHT ATRIUM           Index LA diam:        3.40 cm 1.80 cm/m   RA Area:     20.40 cm LA Vol (A2C):   52.1 ml 27.59 ml/m  RA Volume:   64.80 ml  34.32 ml/m LA Vol (A4C):   57.3 ml 30.35 ml/m LA Biplane Vol: 55.3 ml 29.29 ml/m  AORTIC VALVE                     PULMONIC VALVE AV Area (Vmax):    3.77 cm      PV Vmax:        1.52 m/s AV Area (Vmean):   3.59 cm      PV Vmean:       106.000 cm/s AV Area (VTI):     4.02 cm      PV VTI:         0.236 m AV Vmax:           185.00 cm/s   PV Peak grad:   9.2 mmHg AV Vmean:          125.000 cm/s  PV Mean grad:   5.0 mmHg AV VTI:            0.240 m       RVOT Peak grad: 10 mmHg AV Peak Grad:      13.7 mmHg AV Mean Grad:      8.0 mmHg LVOT Vmax:         168.00 cm/s LVOT Vmean:        108.000 cm/s LVOT VTI:          0.232 m LVOT/AV VTI ratio: 0.97  AORTA Ao Root diam: 3.83 cm MITRAL VALVE                TRICUSPID VALVE MV Area (PHT): 4.80 cm     TR Peak grad:   14.3 mmHg MV Decel Time: 158 msec     TR Vmax:        189.00 cm/s MV E velocity: 78.40 cm/s MV A velocity: 110.00 cm/s  SHUNTS MV E/A ratio:  0.71         Systemic VTI:  0.23 m  Systemic Diam: 2.30 cm                             Pulmonic VTI:  0.183 m Kate Sable  MD Electronically signed by Kate Sable MD Signature Date/Time: 12/01/2020/3:53:46 PM    Final    US Abdomen Limited RUQ (LIVER/GB)  Result Date: 12/01/2020 CLINICAL DATA:  Abnormal LFTs EXAM: ULTRASOUND ABDOMEN LIMITED RIGHT UPPER QUADRANT COMPARISON:  CT from 11/30/2020 FINDINGS: Gallbladder: Gallbladder is well distended with evidence of gallbladder sludge. No definitive cholelithiasis is seen. No wall thickening or pericholecystic fluid is noted. Negative sonographic Murphy's sign is elicited. Common bile duct: Diameter: 5.3 mm. Liver: No focal lesion identified. Within normal limits in parenchymal echogenicity. Portal vein is patent on color Doppler imaging with normal direction of blood flow towards the liver. Other: Note is made of known right renal stones without obstructing change. IMPRESSION: Gallbladder sludge without evidence cholelithiasis. Nonobstructing right renal calculi similar to that seen on prior CT. Electronically Signed   By: Inez Catalina M.D.   On: 12/01/2020 15:29   DG Lumbar Puncture Fluoro Guide  Result Date: 11/30/2020 CLINICAL DATA:  Altered mental status with neck pain and back pain concern for meningitis request received for lumbar puncture. EXAM: DIAGNOSTIC LUMBAR PUNCTURE UNDER FLUOROSCOPIC GUIDANCE COMPARISON:  CT head imaging report reviewed prior to the procedure performed same day. FLUOROSCOPY TIME:  Fluoroscopy Time:  0.2 minute Radiation Exposure Index (if provided by the fluoroscopic device): 1.10 mGy Number of Acquired Spot Images: 1 PROCEDURE: Informed consent was obtained from the patient's wife prior to the procedure, including potential complications of bleeding, infection, CSF leak and need for additional procedures, inability to perform the procedure if patient is unable to cooperate, nerve damage, headache, allergy, and pain. With the patient prone, the lower back was prepped with Betadine. 1% Lidocaine was used for local anesthesia. Lumbar puncture was  attempted to be performed at the L4-L5 level using a 22 gauge needle, however the patient was unable to tolerate the procedure or lie flat, the needle was removed in its entirety and the procedure was aborted. No immediate complications, sterile dressing applied. IMPRESSION: Unsuccessful attempt at fluoroscopic guided lumbar puncture secondary to patient's inability to tolerate the procedure, procedure was aborted for safety reasons. The ordering provider was contacted regarding the inability to perform the procedure. Read By: Tsosie Billing PA-C Electronically Signed   By: Maurine Simmering M.D.   On: 11/30/2020 17:01     Medications   Scheduled Meds:  acetaminophen  1,000 mg Oral Q6H   chlorhexidine gluconate (MEDLINE KIT)  15 mL Mouth Rinse BID   Chlorhexidine Gluconate Cloth  6 each Topical Q0600   enoxaparin (LOVENOX) injection  40 mg Subcutaneous Q24H   mouth rinse  15 mL Mouth Rinse 10 times per day   mupirocin ointment  1 application Nasal BID   oxyCODONE  5 mg Oral Q6H   Continuous Infusions:  ceFTAROline (TEFLARO) IV Stopped (12/02/20 1436)   dexmedetomidine (PRECEDEX) IV infusion Stopped (12/02/20 0951)   dextrose 5 % and 0.45 % NaCl with KCl 40 mEq/L 100 mL/hr at 12/02/20 1531   niCARDipine 5 mg/hr (12/02/20 1531)   potassium chloride 10 mEq (12/02/20 1531)   vancomycin Stopped (12/02/20 0853)       LOS: 2 days    Time spent: 25 minutes with greater than 50% at bedside in coordination of care    Ezekiel Slocumb, DO [facility]  12/02/2020, 4:14 PM      If 7PM-7AM, please contact night-coverage. How to contact the [facility] Attending or Consulting provider [place] or covering provider during after hours [place], for this patient?    Check the care team in [facility] and look for a) attending/consulting [facility] provider listed and b) the [facility] team listed Log into [email or web address] and use 's universal password to access. If you do not have the password, please contact the  hospital operator. Locate the [facility] provider you are looking for under [facility] and page to a number that you can be directly reached. If you still have difficulty reaching the provider, please page the [facility] (Director on Call) for the Hospitalists listed on amion for assistance.

## 2020-12-02 NOTE — Progress Notes (Addendum)
[facility] Progress Note Patient Name: Zadyn Yardley DOB: 12-18-1975 MRN: [id number]   Date of Service  12/02/2020  HPI/Events of Note K+ is 2.6, he is getting scheduled KCL and some KCL in his iv fluids but his K+ has been stuck in the low 2's for some time now. Creatinine is 0.73.   eICU Interventions Will order KCL 10 meq iv Q 1 hour x 4 doses to try to catch up on his total body K+ deficit.         Kerry Kass Braiden Rodman 12/02/2020, 4:32 AM

## 2020-12-03 DIAGNOSIS — G062 Extradural and subdural abscess, unspecified: Secondary | ICD-10-CM | POA: Diagnosis not present

## 2020-12-03 DIAGNOSIS — G061 Intraspinal abscess and granuloma: Secondary | ICD-10-CM | POA: Diagnosis not present

## 2020-12-03 DIAGNOSIS — R7881 Bacteremia: Secondary | ICD-10-CM | POA: Diagnosis not present

## 2020-12-03 DIAGNOSIS — B9562 Methicillin resistant Staphylococcus aureus infection as the cause of diseases classified elsewhere: Secondary | ICD-10-CM | POA: Diagnosis not present

## 2020-12-03 LAB — COMPREHENSIVE METABOLIC PANEL
ALT: 27 U/L (ref 0–44)
AST: 33 U/L (ref 15–41)
Albumin: 2 g/dL — ABNORMAL LOW (ref 3.5–5.0)
Alkaline Phosphatase: 111 U/L (ref 38–126)
Anion gap: 6 (ref 5–15)
BUN: 33 mg/dL — ABNORMAL HIGH (ref 6–20)
CO2: 29 mmol/L (ref 22–32)
Calcium: 7.8 mg/dL — ABNORMAL LOW (ref 8.9–10.3)
Chloride: 103 mmol/L (ref 98–111)
Creatinine, Ser: 0.61 mg/dL (ref 0.61–1.24)
GFR, Estimated: >60 mL/min (ref >60)
Glucose, Bld: 191 mg/dL — ABNORMAL HIGH (ref 70–99)
Potassium: 3.4 mmol/L — ABNORMAL LOW (ref 3.5–5.1)
Sodium: 138 mmol/L (ref 135–145)
Total Bilirubin: 1.1 mg/dL (ref 0.3–1.2)
Total Protein: 5.1 g/dL — ABNORMAL LOW (ref 6.5–8.1)

## 2020-12-03 LAB — BLOOD CULTURE ID PANEL (REFLEXED) - BCID2

## 2020-12-03 LAB — CULTURE, BLOOD (ROUTINE X 2): Special Requests: ADEQUATE

## 2020-12-03 LAB — PREPARE PLATELET PHERESIS
Unit division: 0
Unit division: 0
Unit division: 0
Unit division: 0

## 2020-12-03 LAB — BPAM PLATELET PHERESIS
Blood Product Expiration Date: 202211162359
Blood Product Expiration Date: 202211172359
Blood Product Expiration Date: 202211182359
Blood Product Expiration Date: 202211192359
ISSUE DATE / TIME: 202211160426
ISSUE DATE / TIME: 202211160532
ISSUE DATE / TIME: 202211160717
Unit Type and Rh: 5100
Unit Type and Rh: 6200
Unit Type and Rh: 7300
Unit Type and Rh: 7300

## 2020-12-03 LAB — BPAM RBC
Blood Product Expiration Date: 202212142359
Unit Type and Rh: 6200

## 2020-12-03 LAB — TYPE AND SCREEN
ABO/RH(D): A POS
Antibody Screen: NEGATIVE
Unit division: 0

## 2020-12-03 LAB — VANCOMYCIN, TROUGH: Vancomycin Tr: 8 ug/mL — ABNORMAL LOW (ref 15–20)

## 2020-12-03 LAB — PREPARE RBC (CROSSMATCH)

## 2020-12-03 LAB — SURGICAL PATHOLOGY

## 2020-12-03 MED ORDER — VANCOMYCIN HCL 1500 MG/300ML IV SOLN
1500.0000 mg | Freq: Two times a day (BID) | INTRAVENOUS | Status: DC
  Administered 2020-12-03 – 2020-12-07 (×9): 1500 mg via INTRAVENOUS
  Filled 2020-12-03 (×10): qty 300

## 2020-12-03 MED ORDER — ADULT MULTIVITAMIN W/MINERALS CH
1.0000 | ORAL_TABLET | Freq: Every day | ORAL | Status: DC
  Administered 2020-12-04 – 2020-12-17 (×13): 1 via ORAL
  Filled 2020-12-03 (×13): qty 1

## 2020-12-03 MED ORDER — LOSARTAN POTASSIUM 50 MG PO TABS
50.0000 mg | ORAL_TABLET | Freq: Every day | ORAL | Status: DC
  Administered 2020-12-04 – 2020-12-17 (×13): 50 mg via ORAL
  Filled 2020-12-03 (×13): qty 1

## 2020-12-03 MED ORDER — POLYETHYLENE GLYCOL 3350 17 G PO PACK
17.0000 g | PACK | Freq: Every day | ORAL | Status: DC
  Administered 2020-12-05 – 2020-12-14 (×9): 17 g via ORAL
  Filled 2020-12-03 (×10): qty 1

## 2020-12-03 MED ORDER — VANCOMYCIN HCL 1000 MG/200ML IV SOLN: 1000.0000 mg | Freq: Two times a day (BID) | INTRAVENOUS | Status: DC

## 2020-12-03 MED ORDER — SENNOSIDES-DOCUSATE SODIUM 8.6-50 MG PO TABS
1.0000 | ORAL_TABLET | Freq: Two times a day (BID) | ORAL | Status: DC
  Administered 2020-12-03 – 2020-12-08 (×9): 1 via ORAL
  Filled 2020-12-03 (×9): qty 1

## 2020-12-03 MED ORDER — LOSARTAN POTASSIUM 50 MG PO TABS
25.0000 mg | ORAL_TABLET | Freq: Once | ORAL | Status: AC
  Administered 2020-12-03: 25 mg via ORAL
  Filled 2020-12-03: qty 1

## 2020-12-03 MED ORDER — ENSURE ENLIVE PO LIQD
237.0000 mL | Freq: Three times a day (TID) | ORAL | Status: DC
  Administered 2020-12-03 – 2020-12-13 (×21): 237 mL via ORAL

## 2020-12-03 MED ORDER — LACTATED RINGERS IV SOLN: INTRAVENOUS | Status: DC

## 2020-12-03 MED ORDER — HYDRALAZINE HCL 25 MG PO TABS
25.0000 mg | ORAL_TABLET | Freq: Four times a day (QID) | ORAL | Status: DC | PRN
  Administered 2020-12-03 – 2020-12-04 (×2): 25 mg via ORAL
  Filled 2020-12-03 (×2): qty 1

## 2020-12-03 MED ORDER — LOSARTAN POTASSIUM 50 MG PO TABS
25.0000 mg | ORAL_TABLET | Freq: Every day | ORAL | Status: DC
  Administered 2020-12-03: 25 mg via ORAL

## 2020-12-03 NOTE — Progress Notes (Signed)
Date of Admission:  11/30/2020     ID: Caleb Fisher is a 45 y.o. male  Active Problems:   Encephalopathy acute   Altered mental status   MRSA bacteremia   Abscess in epidural space of cervical spine   Hypertensive urgency   Hypokalemia   Thrombocytopenia (HCC)   Acute metabolic encephalopathy   Severe sepsis (HCC)    Subjective: Patient is doing much better He is alert and oriented He participated with PT and walked in the corridor.. Medications:   acetaminophen  1,000 mg Oral Q6H   Chlorhexidine Gluconate Cloth  6 each Topical Q0600   enoxaparin (LOVENOX) injection  40 mg Subcutaneous Q24H   feeding supplement  237 mL Oral TID BM   losartan  25 mg Oral Once   [START ON 12/04/2020] losartan  50 mg Oral Daily   [START ON 12/04/2020] multivitamin with minerals  1 tablet Oral Daily   mupirocin ointment  1 application Nasal BID   oxyCODONE  5 mg Oral Q6H   polyethylene glycol  17 g Oral Daily   senna-docusate  1 tablet Oral BID    Objective: Vital signs in last 24 hours: Temp:  [98.2 F (36.8 C)-99 F (37.2 C)] 99 F (37.2 C) (11/18 1600) Pulse Rate:  [67-98] 80 (11/18 2000) Resp:  [10-20] 16 (11/18 1600) BP: (123-173)/(72-125) 154/125 (11/18 2000) SpO2:  [91 %-98 %] 91 % (11/18 2000)  PHYSICAL EXAM:  General: Alert, cooperative, no distress at rest but has pain on movement, appears stated age.  Head: Normocephalic, without obvious abnormality, atraumatic. Eyes: Conjunctivae clear, anicteric sclerae. Pupils are equal ENT Nares normal. No drainage or sinus tenderness. Lips, mucosa, and tongue normal. No Thrush Nape of the neck surgical site is healthy no carotid bruit and no JVD. Back: No CVA tenderness. Lungs: Clear to auscultation bilaterally. No Wheezing or Rhonchi. No rales. Heart: Regular rate and rhythm, no murmur, rub or gallop. Abdomen: Soft, non-tender,not distended. Bowel sounds normal. No masses Foley in place Extremities: Excoriated lesions  forearms. Skin: As above Lymph: Cervical, supraclavicular normal. Neurologic: Grossly non-focal  Lab Results Recent Labs    12/01/20 1047 12/02/20 0327 12/02/20 1309 12/02/20 1912 12/03/20 0755  WBC 15.6* 15.2*  --   --   --   HGB 11.0* 11.3*  --   --   --   HCT 31.8* 32.6*  --   --   --   NA 136 139  --  138 138  K 2.3* 2.6*   < > 3.1* 3.4*  CL 102 100  --  106 103  CO2 23 26  --  27 29  BUN 34* 36*  --  38* 33*  CREATININE 1.01 0.76  --  0.69 0.61   < > = values in this interval not displayed.   Liver Panel Recent Labs    12/02/20 0327 12/03/20 0755  PROT 5.2* 5.1*  ALBUMIN 1.9* 2.0*  AST 40 33  ALT 32 27  ALKPHOS 107 111  BILITOT 1.4* 1.1   Microbiology: 11/30/2020 blood culture 4 out of 4 MRSA MRSA nares positive Wound culture of the right forearm sent on 12/01/2020: Staph aureus 12/01/2020 epidural abscess culture: Staph aureus 12/02/2020 blood culture: Gram-positive cocci Studies/Results: DG Chest Port 1 View  Result Date: 12/02/2020 CLINICAL DATA:  Acute respiratory failure with hypoxia. EXAM: PORTABLE CHEST 1 VIEW COMPARISON:  December 01, 2020. FINDINGS: Stable cardiomediastinal silhouette. Endotracheal tube is in good position. Stable mild interstitial densities are noted which  may represent minimal pulmonary edema or possibly atypical inflammation, or possibly scarring. Bony thorax is unremarkable. IMPRESSION: Stable mild interstitial densities are noted bilaterally as described above. Stable support apparatus. Electronically Signed   By: Marijo Conception M.D.   On: 12/02/2020 08:11     Assessment/Plan:  MRSA bacteremia with disseminated infection causing epidural abscess and septic emboli in both lungs. Patient is on vancomycin and ceftaroline.   Repeat blood culture 1 out of 2 sets positive.   Will repeat cultures again tomorrow.  Patient will need minimum 6 weeks of IV antibiotic. 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 PICC line cannot be placed or needed can the patient be discharged until he has a negative blood culture.  Cervical epidural abscess- C2-C5- underwent right hemilaminectomy C2-C4 washout of the abscess.  Cultures have been sent on the results of gram-positive cocci.  Bilateral pulmonary nodules.  Likely septic emboli.  2D echo done  Thrombocytopenia.  Likely related to infection stable  Encephalopathy has resolved  AKI has resolved. Informed his nurse to remove the Foley.  Discussed the management with the patient and his wife in great detail.  ID will follow him peripherally this weekend.  Call if needed.

## 2020-12-03 NOTE — Progress Notes (Addendum)
Physical Therapy Evaluation Patient Details Name: Caleb Fisher MRN: [id number] DOB: [birth year] Today's Date: 12/03/2020   History of Present Illness 45 year old male with no past medical history other than tobacco abuse, presented to the ED from home on 11/30/2020 with altered mental status, increased back pain and new onset neck pain, and frequent falls. MRI of the spine shows epidural cervical abscess around C2-C5.  Pt underwent C2-4 right hemilaminectomies for evacuation of epidural on 12/01/20.    PT Comments    Pt received supine in bed, agreeable to therapy. Appears lethargic upon arrival however becomes more alert with clear speech once lights are on and pt sitting EOB. Bed mobility, transfers and ambulation were all performed with CGA - steadying in standing was required on occasion. Overall balance improved as session progressed in both sitting and standing. Ambulation was performed with guarded posture, small stride length and decreased foot clearance. Due to fatigue, pt returned to bed with all needs met. Rec pt d/c home with family support and HHPT to increase global strength and endurance. Would benefit from skilled PT to address above deficits and promote optimal return to PLOF.   Recommendations for follow up therapy are one component of a multi-disciplinary discharge planning process, led by the attending physician.  Recommendations may be updated based on patient status, additional functional criteria and insurance authorization.  Follow Up Recommendations  Home health PT     Assistance Recommended at Discharge Frequent or constant Supervision/Assistance  Equipment Recommendations  None recommended by PT    Recommendations for Other Services       Precautions / Restrictions Precautions Precautions: Fall Restrictions Weight Bearing Restrictions: No Other Position/Activity Restrictions: s/p C2-4 right hemilaminectomies     Mobility  Bed Mobility Overal bed mobility:  Needs Assistance Bed Mobility: Supine to Sit;Sit to Supine     Supine to sit: Min guard Sit to supine: Min guard   General bed mobility comments: Increased time, effort and caution. Cueing to complete transition as pt remained leaning onto RUE for extended time once sitting EOB.    Transfers Overall transfer level: Needs assistance Equipment used: 1 person hand held assist;None Transfers: Sit to/from Stand;Bed to chair/wheelchair/BSC Sit to Stand: Min guard Stand pivot transfers: Min guard         General transfer comment: Increased time and effort to perform. Pt performed stand pivot EOB<>BSC and 2 additional STS in remainder of session. Progressed from HHA to no HHA - CGA at all times.    Ambulation/Gait Ambulation/Gait assistance: Min guard Gait Distance (Feet): 70 Feet Assistive device: None Gait Pattern/deviations: Step-through pattern;Decreased stride length;Decreased step length - right;Decreased step length - left;Narrow base of support       General Gait Details: Guarded posture to ambulate in hall. Very short stride length with decreased velocity.   Stairs             Wheelchair Mobility    Modified Rankin (Stroke Patients Only)       Balance Overall balance assessment: Needs assistance Sitting-balance support: Feet supported;No upper extremity supported Sitting balance-Leahy Scale: Fair Sitting balance - Comments: Progressed from single UE support to no UE support while EOB. CGA provided initially for dizziness. Fair balance with multidirectional trunk leans while on BSC to complete hygiene.   Standing balance support: No upper extremity supported;During functional activity Standing balance-Leahy Scale: Fair Standing balance comment: No apparent LOB however moderate decreased steadiness in both standing and walking requiring CGA.  Cognition Arousal/Alertness: Awake/alert Behavior During Therapy: WFL for  tasks assessed/performed Overall Cognitive Status: Within Functional Limits for tasks assessed                                 General Comments: A&Ox4, appropriately answers PLOF questions however with increased processing time.        Exercises Other Exercises Other Exercises: Stand pivot transfer to [facility]; pt remained sitting on BSC for 25 minutes (unbilled) - successful BM, RN notified. Pt performed hygiene in sitting and again in standing with PT present to steady while standing.    General Comments        Pertinent Vitals/Pain Pain Assessment: 0-10 Pain Score: 6  Pain Location: global spine, especially cervical Pain Descriptors / Indicators: Pressure;Aching;Sharp Pain Intervention(s): Limited activity within patient's tolerance;Monitored during session;Premedicated before session;Repositioned    Home Living Family/patient expects to be discharged to:: Private residence Living Arrangements: Spouse/significant other;Children (adult son; brother) Available Help at Discharge: Family;Available 24 hours/day Type of Home: House Home Access: Stairs to enter Entrance Stairs-Rails: Right Entrance Stairs-Number of Steps: 3   Home Layout: One level        Prior Function            PT Goals (current goals can now be found in the care plan section) Acute Rehab PT Goals Patient Stated Goal: to get back to normal PT Goal Formulation: With patient Time For Goal Achievement: 12/17/20 Potential to Achieve Goals: Fair    Frequency    Min 2X/week      PT Plan      Co-evaluation              AM-PAC PT "6 Clicks" Mobility   Outcome Measure  Help needed turning from your back to your side while in a flat bed without using bedrails?: A Little Help needed moving from lying on your back to sitting on the side of a flat bed without using bedrails?: A Little Help needed moving to and from a bed to a chair (including a wheelchair)?: A Little Help needed  standing up from a chair using your arms (e.g., wheelchair or bedside chair)?: A Little Help needed to walk in hospital room?: A Little Help needed climbing 3-5 steps with a railing? : A Little 6 Click Score: 18    End of Session Equipment Utilized During Treatment: Gait belt Activity Tolerance: Patient tolerated treatment well Patient left: in bed;with call bell/phone within reach Nurse Communication: Mobility status PT Visit Diagnosis: Unsteadiness on feet (R26.81);Other abnormalities of gait and mobility (R26.89);Muscle weakness (generalized) (M62.81);Repeated falls (R29.6);Difficulty in walking, not elsewhere classified (R26.2)     Time: 1030-1140 PT Time Calculation (min) (ACUTE ONLY): 70 min  Charges:  $Gait Training: 8-22 mins $Therapeutic Activity: 23-37 mins                     Patrina Levering PT, DPT 12/03/20 1:36 PM [phone]

## 2020-12-03 NOTE — Progress Notes (Signed)
    Attending Progress Note  History: Caleb Fisher is a 45 y.o male presenting with AMS and sepsis. Blood cultures showed MRSA bacteremia. 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 Pt underwent C2-4 right hemilaminectomies for evacuation of epidural on 12/01/20  POD2: NAEO. Pt extubated yesterday. Complains of neck pain and generalized fatigue. Remains on Cardene drip.  POD1: Pt remained intubated overnight for agitation.  Physical Exam: Vitals:   12/03/20 0400 12/03/20 0500  BP: 123/72 138/87  Pulse: 93 94  Resp: 13 13  Temp: 98.8 F (37.1 C)   SpO2: 92% 92%     CNI Extubated and easily aroused. Oriented to place and self.  Follows commands x 4. Moves all extremities against gravity. HV drain in place with scant output.  Data:  Recent Labs  Lab 12/01/20 1047 12/02/20 0327 12/02/20 1309 12/02/20 1912  NA 136 139  --  138  K 2.3* 2.6*   < > 3.1*  CL 102 100  --  106  CO2 23 26  --  27  BUN 34* 36*  --  38*  CREATININE 1.01 0.76  --  0.69  GLUCOSE 83 171*  --  181*  CALCIUM 8.1* 8.1*  --  8.3*   < > = values in this interval not displayed.    Recent Labs  Lab 12/02/20 0327  AST 40  ALT 32  ALKPHOS 107      Recent Labs  Lab 12/01/20 0157 12/01/20 1047 12/02/20 0327  WBC 13.0* 15.6* 15.2*  HGB 11.3* 11.0* 11.3*  HCT 32.6* 31.8* 32.6*  PLT 80* 104* 92*    Recent Labs  Lab 12/01/20 0147 12/01/20 1047 12/02/20 0327  APTT 41* 40* 33  INR 1.3* 1.4* 1.2          Other tests/results:  MRI C-spine IMPRESSION: 1. 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 2. No spinal cord signal change. 3. Small prevertebral effusion from C2-C5. 4. No acute intracranial abnormality. 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   Electronically Signed: By: Ulyses Jarred M.D. On:  11/30/2020 21:51    Assessment/Plan:  Caleb Fisher is a 45 y.o presenting with MRSA bacteremia and a cervical epidural abscess status post C2-4 right hemilaminectomies for evacuation of epidural on 12/01/20.  - interoperative cultures show: Few gram positive cocci - ABX coverage per ID recommendations - no activity restrictions from a neurosurgical standpoint - Plan to remove hemovac today - q4 hr neuro checks while in ICU - Ok to start DVT ppx from neurosurgical standpoint - Remainder of plan per critical care recommendations.  Cooper Render PA-C Department of Neurosurgery

## 2020-12-03 NOTE — Progress Notes (Addendum)
Pharmacy Antibiotic Note  Caleb Fisher is a 45 y.o. 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  Pharmacy has been consulted for Vancomycin dosing.  Patient presented with severe neck pain and confusion. No apparent PMH other than chronic back pain. MRI visualized cervical abscess of neck and spine at C2-C5. CT with bilateral pulmonary nodules, likely septic emboli.   Added antistaphylococcal beta lactam, ceftaroline, while awaiting MIC of vancomycin d/t concern for high bioburden. S/p C2-4 right hemilaminectomies for evacuation of epidural on 12/01/20. ECHO negative for vegetations.  Renal function improved Scr 0.61 and at BL. Afebrile x 24 hours.  Vancomycin dose given 11/17 @1956  11/17 Vanc pk 19 (@2245  2 hours post-dose) 11/18 Vanc tr 8 (@0755  prior to next dose)   Plan: Increase vancomycin to 1500 mg every 12 hours Goal AUC 400-550. AUC 508.5 Cmin 12.3 Levels at steady state. Tentative plan for peak 11/19 PM and trough 11/20 AM.  Also on ceftaroline 600 mg every 8 hours as above.  Monitor culture results, renal function, LOT.  Expecting IV antibiotic duration of 6 weeks.  Height: 5\' 9"  (175.3 cm) Weight: 73.4 kg (161 lb 13.1 oz) IBW/kg (Calculated) : 70.7  Temp (24hrs), Avg:98.5 F (36.9 C), Min:98.1 F (36.7 C), Max:99.1 F (37.3 C)  Recent Labs  Lab 11/30/20 1310 11/30/20 1643 11/30/20 2237 12/01/20 0030 12/01/20 0147 12/01/20 0157 12/01/20 1047 12/02/20 0327 12/02/20 1912 12/02/20 2245  WBC 14.2*  --   --  8.8  --  13.0* 15.6* 15.2*  --   --   CREATININE 1.58*  --   --   --   --  1.15 1.01 0.76 0.69  --   LATICACIDVEN 2.0* 2.2* 1.7  --  1.9  --   --   --   --   --   VANCOPEAK  --   --   --   --   --   --   --   --   --  19*     Estimated Creatinine Clearance: 115.4 mL/min (by C-G formula based on SCr of 0.69 mg/dL).    No Known Allergies  Antimicrobials this admission: 11/15 Cefepime >> x 2 doses 11/15 Flagyl >>   11/15 Vancomycin >>  Microbiology results: 11/15 BCx: 4 of 4 bottle w/ MRSA 11/15 CSFCx: Pending  11/16 Epidural absces: few GPCs 11/16 wound: few GPCs 11/17 Bcx: NG < 12 hours   Thank you for allowing pharmacy to be a part of this patient's care.    Wynelle Cleveland, PharmD Pharmacy Resident  12/03/2020 7:16 AM

## 2020-12-03 NOTE — Progress Notes (Signed)
Occupational Therapy Evaluation Patient Details Name: Caleb Fisher MRN: [id number] DOB: 1975/08/20 Today's Date: 12/03/2020   History of Present Illness 45 year old male with no past medical history other than tobacco abuse, presented to the ED from home on 11/30/2020 with altered mental status, increased back pain and new onset neck pain, and frequent falls. MRI of the spine shows epidural cervical abscess around C2-C5.  Pt underwent C2-4 right hemilaminectomies for evacuation of epidural on 12/01/20.   Clinical Impression   Caleb Fisher was seen for OT evaluation this date. Prior to hospital admission, pt was independent and working. Pt lives with spouse. Currently pt demonstrates impairments as described below (See OT problem list) which functionally limit his ability to perform ADL/self-care tasks. Pt currently requires CGA for toilet t/f, ambulating, BSC. SUP for pericare, standing. SUP for LBD, don/doff socks, sitting EOB. Ambulated per pt request, in room ~ 10 ft w/ intermittent HHA.  Pt would benefit from skilled OT services to address noted impairments and functional limitations (see below for any additional details) in order to maximize safety and independence while minimizing falls risk and caregiver burden. Upon hospital discharge, recommend no OT follow up.      Recommendations for follow up therapy are one component of a multi-disciplinary discharge planning process, led by the attending physician.  Recommendations may be updated based on patient status, additional functional criteria and insurance authorization.   Follow Up Recommendations  No OT follow up    Assistance Recommended at Discharge Intermittent Supervision/Assistance  Functional Status Assessment  Patient has had a recent decline in their functional status and demonstrates the ability to make significant improvements in function in a reasonable and predictable amount of time.  Equipment Recommendations  BSC/3in1;Other  (comment) (Gait belt, RW)    Recommendations for Other Services       Precautions / Restrictions Precautions Precautions: Fall Restrictions Weight Bearing Restrictions: No      Mobility Bed Mobility Overal bed mobility: Needs Assistance Bed Mobility: Supine to Sit;Sit to Supine     Supine to sit: Min guard Sit to supine: Min guard   General bed mobility comments: Increased time, cueing. Distracted by lines/leads, needed assist for line mgmt.    Transfers Overall transfer level: Needs assistance Equipment used: 1 person hand held assist Transfers: Sit to/from Stand Sit to Stand: Min guard Stand pivot transfers: Min guard         General transfer comment:       Balance Overall balance assessment: Needs assistance Sitting-balance support: Feet supported;No upper extremity supported Sitting balance-Leahy Scale: Good Sitting balance - Comments:    Standing balance support: No upper extremity supported;During functional activity Standing balance-Leahy Scale: Fair Standing balance comment:                            ADL either performed or assessed with clinical judgement   ADL Overall ADL's : Needs assistance/impaired                                       General ADL Comments: CGA for toilet t/f, ambulating, BSC. SUP for pericare, standing. SUP for LBD, don/doff socks, sitting EOB. Ambulated per pt request, in room ~ 10 ft w/ intermittent HHA.     Vision         Perception     Praxis  Pertinent Vitals/Pain Pain Assessment: Faces Pain Score: 6  Faces Pain Scale: Hurts even more Pain Location: global spine, especially cervical Pain Descriptors / Indicators: Pressure;Aching;Sharp Pain Intervention(s): Limited activity within patient's tolerance;Monitored during session;Repositioned     Hand Dominance Right   Extremity/Trunk Assessment Upper Extremity Assessment Upper Extremity Assessment: Overall WFL for tasks  assessed   Lower Extremity Assessment Lower Extremity Assessment: Overall WFL for tasks assessed   Cervical / Trunk Assessment Cervical / Trunk Assessment: Neck Surgery   Communication Communication Communication: No difficulties   Cognition Arousal/Alertness: Awake/alert Behavior During Therapy: WFL for tasks assessed/performed Overall Cognitive Status: Within Functional Limits for tasks assessed                                       General Comments       Exercises Exercises: Other exercises Other Exercises Other Exercises: Pt educ re: OT role, d/c recs, falls prevention Other Exercises: Sup<>sit, sit<>stand, ambulate to BCS, BM during session, pericare   Shoulder Instructions      Home Living Family/patient expects to be discharged to:: Private residence Living Arrangements: Spouse/significant other;Children Available Help at Discharge: Family;Available 24 hours/day Type of Home: House Home Access: Stairs to enter [employer] of Steps: 3 Entrance Stairs-Rails: Can reach both Home Layout: One level     Bathroom Shower/Tub: Teacher, early years/pre: Standard     Home Equipment: None          Prior Functioning/Environment Prior Level of Function : Independent/Modified Independent             Mobility Comments: At baseline, pt is an Clinical biochemist, 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          OT Problem List: Decreased activity tolerance;Impaired balance (sitting and/or standing);Decreased knowledge of precautions;Decreased safety awareness;Decreased coordination      OT Treatment/Interventions: Self-care/ADL training;Therapeutic exercise;Energy conservation;Therapeutic activities    OT Goals(Current goals can be found in the care plan section) Acute Rehab OT Goals Patient Stated Goal: to go home OT Goal Formulation: With patient/family Time For Goal Achievement: 12/17/20 Potential to Achieve Goals: Good ADL  Goals Pt Will Perform Grooming: standing;with modified independence Pt Will Transfer to Toilet: with modified independence;ambulating;regular height toilet Additional ADL Goal #1: Pt will identify and implement 2/2 non-pharmaceutical pain management strategies.  OT Frequency: Min 2X/week   Barriers to D/C:            Co-evaluation              AM-PAC OT "6 Clicks" Daily Activity     Outcome Measure Help from another person eating meals?: None Help from another person taking care of personal grooming?: A Little Help from another person toileting, which includes using toliet, bedpan, or urinal?: A Little Help from another person bathing (including washing, rinsing, drying)?: A Little Help from another person to put on and taking off regular upper body clothing?: None Help from another person to put on and taking off regular lower body clothing?: A Little 6 Click Score: 20   End of Session Nurse Communication: Mobility status  Activity Tolerance: Patient tolerated treatment well Patient left: in bed;with call bell/phone within reach;with family/visitor present;with nursing/sitter in room  OT Visit Diagnosis: Unsteadiness on feet (R26.81);Other abnormalities of gait and mobility (R26.89)                Time: 1420-1500 OT Time Calculation (min):  40 min Charges:  OT General Charges $OT Visit: 1 Visit OT Evaluation $OT Eval Moderate Complexity: 1 Mod OT Treatments $Self Care/Home Management : 23-37 mins  Nino Glow, Markus Daft 12/03/2020, 4:01 PM

## 2020-12-03 NOTE — Progress Notes (Signed)
PROGRESS NOTE    Caleb Fisher   NUU:[id number]  DOB: 06-19-1975  PCP: Pcp, No    DOA: 11/30/2020 LOS: 3    Brief Narrative / Hospital Course to Date:   45 year old male with no past medical history other than tobacco abuse, presented to the ED from home on 11/30/2020 with altered mental status, increased back pain and new onset neck pain, and frequent falls.  Evaluation in the ED was consistent with severe sepsis from unclear source, with tachycardia and tachypnea.  Subsequently found to have MRSA bacteremia.  LP was attempted but unable to be completed due to patient agitation and unable to lie still.  Further imaging of the spine revealed a cervical epidural abscess involving C2-C5.  Started on empiric broad-spectrum antibiotics.  Labs also notable for profound hypokalemia, thrombocytopenia.  Admitted to [facility] service with infectious disease, neurosurgery, neurology and PCCM consulted.  Transferred to ICU.  Assessment & Plan   Active Problems:   Encephalopathy acute   Altered mental status   MRSA bacteremia   Abscess in epidural space of cervical spine   Hypertensive urgency   Hypokalemia   Thrombocytopenia (HCC)   Acute metabolic encephalopathy   Severe sepsis (HCC)   Severe sepsis -POA as evidenced by tachycardia, tachypnea, fevers, acute encephalopathy consistent with organ dysfunction and severe sepsis. 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  most likely due to infection/sepsis, ?opioid withdrawal. LP on admisison was unable to  be obtained after multiple attempts, due to patient agitation and inability to lay still. Briefly on Precedex POD1 s/p extubation in ICU -- Treat infection and other underlying conditions as outlined --Aspiration precautions --Delirium precautions  Thrombocytopenia -likely due to sepsis, DIC is a concern. Has received platelet transfusions. -- Hematology consulted  Acute kidney injury -likely due to dehydration, possibly NSAID use. Renal function improving with IV fluids. --Monitor BMP  Hypokalemia -refractory.  K continues to be replaced. With hypertension as well, consider hyperaldosteronism --Pending urine potassium creatinine --Pending aldo/renin activity ratio --Further potassium placement and fluids and riders until taking oral safely --Monitor BMP, replace as needed  Hypertension with hypertensive urgency - --Stop Cardene drip --Start losartan 25 mg daily & titrate up as needed --Monitor renal function & electrolytes --PRN hydralazine PO or IV labetalol --Per neurosurgery, goal MAP is 70-110 --Monitor BP closely --Transfer out of ICU if BP stable of Cardene gtt  Transaminitis - Resolved. POA with AST elevated, possibly due to shock liver versus alcohol abuse although wife reports patient seldom drinks. Right upper quadrant ultrasound showed gallbladder sludge without cholelithiasis, nonobstructing right renal calculi similar to that seen on prior CT. --Follow LFTs   Patient BMI: Body mass index is 23.9 kg/m.   DVT prophylaxis: enoxaparin (LOVENOX) injection 40 mg Start: 12/02/20 2200 SCD's Start: 12/01/20 1907   Diet:  Diet Orders (From admission, onward)     Start     Ordered   12/02/20 1505  Diet regular Room service appropriate? Yes; Fluid consistency: Thin  Diet effective now       Question Answer Comment  Room service appropriate? Yes   Fluid consistency: Thin  12/02/20 1504              Code Status: Full Code   Subjective 12/03/20     Patient awake sitting up in bed.  Reports neck pain mostly with any movement, okay when still.  Says he's slowly feeling better.  Appetite is fair.   Disposition Plan & Communication   Status is: Inpatient  Remains inpatient appropriate because: Severity of illness.  Remains on IV therapies with evaluation pending for MRSA bacteremia and cervical spinal abscess.    Family Communication: Wife and best friend at bedside during encounter 11/16.   Consults, Procedures, Significant Events   Consultants:  Infectious disease Neurosurgery PCCM Neurology Hematology/oncology  Procedures:  Washout of cervical spinal epidural abscess  Antimicrobials:  Anti-infectives (From admission, onward)    Start     Dose/Rate Route Frequency Ordered Stop   12/03/20 1000  vancomycin (VANCOREADY) IVPB 1000 mg/200 mL  Status:  Discontinued        1,000 mg 200 mL/hr over 60 Minutes Intravenous Every 12 hours 12/03/20 0850 12/03/20 0850   12/03/20 1000  vancomycin (VANCOREADY) IVPB 1500 mg/300 mL        1,500 mg 150 mL/hr over 120 Minutes Intravenous Every 12 hours 12/03/20 0850     12/01/20 1630  ceftaroline (TEFLARO) 600 mg in sodium chloride 0.9 % 100 mL IVPB        600 mg 100 mL/hr over 60 Minutes Intravenous Every 8 hours 12/01/20 1533     12/01/20 0800  cefTRIAXone (ROCEPHIN) 2 g in sodium chloride 0.9 % 100 mL IVPB  Status:  Discontinued        2 g 200 mL/hr over 30 Minutes Intravenous Every 24 hours 11/30/20 2311 12/01/20 0032   12/01/20 0700  vancomycin (VANCOREADY) IVPB 1000 mg/200 mL  Status:  Discontinued        1,000 mg 200 mL/hr over 60 Minutes Intravenous Every 12 hours 11/30/20 2319 12/03/20 0850   12/01/20 0700  ceFEPIme (MAXIPIME) 2 g in sodium chloride 0.9 % 100 mL IVPB  Status:  Discontinued        2 g 200 mL/hr over 30 Minutes Intravenous Every 8 hours 12/01/20 0054 12/01/20 0128   12/01/20 0618  metroNIDAZOLE (FLAGYL) IVPB 500 mg  Status:  Discontinued        500 mg 100  mL/hr over 60 Minutes Intravenous Every 6 hours 12/01/20 0031 12/01/20 1237   12/01/20 0600  cefTRIAXone (ROCEPHIN) 2 g in sodium chloride 0.9 % 100 mL IVPB  Status:  Discontinued        2 g 200 mL/hr over 30 Minutes Intravenous Every 12 hours 12/01/20 0035 12/01/20 0049   12/01/20 0045  cefTRIAXone (ROCEPHIN) 2 g in sodium chloride 0.9 % 100 mL IVPB  Status:  Discontinued        2 g 200 mL/hr over 30 Minutes Intravenous Every 12 hours 12/01/20 0032 12/01/20 0035   12/01/20 0000  metroNIDAZOLE (FLAGYL) IVPB 500 mg  Status:  Discontinued        500 mg 100 mL/hr over 60 Minutes Intravenous Every 12 hours 11/30/20 2311 12/01/20 0031   11/30/20 2300  ceFEPIme (MAXIPIME) 2 g in sodium chloride 0.9 % 100 mL IVPB  Status:  Discontinued        2 g 200 mL/hr over 30 Minutes Intravenous Every 8 hours 11/30/20 1816 12/01/20 0000   11/30/20 1930  acyclovir (ZOVIRAX) 725 mg in dextrose 5 % 150 mL IVPB  Status:  Discontinued        10 mg/kg  72.6 kg 164.5 mL/hr over 60 Minutes Intravenous Every 8 hours 11/30/20 1915 12/01/20 0032   11/30/20 1500  vancomycin (VANCOREADY) IVPB 1750 mg/350 mL        1,750 mg 175 mL/hr over 120 Minutes Intravenous  Once 11/30/20 1443 11/30/20 2200   11/30/20 1445  ceFEPIme (MAXIPIME) 2 g in sodium chloride 0.9 % 100 mL IVPB        2 g 200 mL/hr over 30 Minutes Intravenous  Once 11/30/20 1436 11/30/20 1728   11/30/20 1445  metroNIDAZOLE (FLAGYL) IVPB 500 mg        500 mg 100 mL/hr over 60 Minutes Intravenous  Once 11/30/20 1436 11/30/20 1835   11/30/20 1445  vancomycin (VANCOCIN) IVPB 1000 mg/200 mL premix  Status:  Discontinued        1,000 mg 200 mL/hr over 60 Minutes Intravenous  Once 11/30/20 1436 11/30/20 1443         Micro    Objective   Vitals:   12/03/20 1500 12/03/20 1531 12/03/20 1600 12/03/20 1717  BP: (!) 165/89 (!) 155/100 (!) 150/99 (!) 163/109  Pulse: 67 74 73   Resp:   16   Temp:   99 F (37.2 C)   TempSrc:   Oral   SpO2: 94% 92% 91%    Weight:      Height:        Intake/Output Summary (Last 24 hours) at 12/03/2020 1827 Last data filed at 12/03/2020 1505 Gross per 24 hour  Intake 4105.03 ml  Output 3875 ml  Net 230.03 ml   Filed Weights   11/30/20 1311 11/30/20 2216  Weight: 72.6 kg 73.4 kg    Physical Exam:  General exam: awake, sitting up in bed, , no acute distress Respiratory system: Lungs clear bilaterally, normal respiratory effort, on room air. Cardiovascular system: normal S1/S2, RRR, no audible  murmur Central nervous system: A&Ox4, no gross focal deficits, normal speech Extremities: no edema, moves all, normal tone Psychiatry: normal mood, intact judgement and insight  Labs   Data Reviewed: I have personally reviewed following labs and imaging studies  CBC: Recent Labs  Lab 11/30/20 1310 12/01/20 0030 12/01/20 0147 12/01/20 0157 12/01/20 1047 12/02/20 0327  WBC 14.2* 8.8  --  13.0* 15.6* 15.2*  NEUTROABS 12.8*  --   --   --  14.0* 13.5*  HGB 12.8* 9.8*  --  11.3* 11.0* 11.3*  HCT 36.6* 28.1*  --  32.6* 31.8* 32.6*  MCV 85.1 85.9  --  83.8 84.4 84.9  PLT 125* NO PLT 77* 80* 104* 92*   Basic Metabolic Panel: Recent Labs  Lab 11/30/20 1310 12/01/20 0157 12/01/20 1047 12/02/20 0327 12/02/20 1309 12/02/20 1912 12/03/20 0755  NA 135 137 136 139  --  138 138  K 3.0* 2.4* 2.3* 2.6* 2.9* 3.1* 3.4*  CL 99 103 102 100  --  106 103  CO2 23 23 23 26   --  27 29  GLUCOSE 117* 86 83 171*  --  181* 191*  BUN 54* 38* 34* 36*  --  38* 33*  CREATININE 1.58* 1.15 1.01 0.76  --  0.69 0.61  CALCIUM 8.4* 8.2* 8.1* 8.1*  --  8.3* 7.8*  MG 2.4  --   --  2.3  --   --   --    GFR: Estimated Creatinine Clearance: 115.4 mL/min (by C-G formula based on SCr of 0.61 mg/dL). Liver Function Tests:  Recent Labs  Lab 11/30/20 1310 12/01/20 0157 12/02/20 0327 12/03/20 0755  AST 102* 97* 40 33  ALT 55* 49* 32 27  ALKPHOS 166* 129* 107 111  BILITOT 1.1 1.5* 1.4* 1.1  PROT 6.5 5.3* 5.2* 5.1*  ALBUMIN  2.9* 2.2* 1.9* 2.0*   No results for input(s): LIPASE, AMYLASE in the last 168 hours. Recent Labs  Lab 11/30/20 1454  AMMONIA 14   Coagulation Profile: Recent Labs  Lab 11/30/20 1310 12/01/20 0147 12/01/20 1047 12/02/20 0327  INR 1.2 1.3* 1.4* 1.2   Cardiac Enzymes: No results for input(s): CKTOTAL, CKMB, CKMBINDEX, TROPONINI in the last 168 hours. BNP (last 3 results) No results for input(s): PROBNP in the last 8760 hours. HbA1C: Recent Labs    12/01/20 0157  HGBA1C 5.8*   CBG: Recent Labs  Lab 12/01/20 1131  GLUCAP 97   Lipid Profile: Recent Labs    12/01/20 0157 12/02/20 0327  CHOL 75  --   HDL <10*  --   LDLCALC NOT CALCULATED  --   TRIG 147 198*  CHOLHDL NOT CALCULATED  --    Thyroid Function Tests: Recent Labs    12/01/20 0157  FREET4 1.31*   Anemia Panel: No results for input(s): VITAMINB12, FOLATE, FERRITIN, TIBC, IRON, RETICCTPCT in the last 72 hours. Sepsis Labs: Recent Labs  Lab 11/30/20 1310 11/30/20 1643 11/30/20 2237 12/01/20 0147 12/01/20 0157  PROCALCITON 17.28  --   --   --  22.89  LATICACIDVEN 2.0* 2.2* 1.7 1.9  --     Recent Results (from the past 240 hour(s))  Blood Culture (routine x 2)     Status: Abnormal   Collection Time: 11/30/20  2:54 PM   Specimen: BLOOD  Result Value Ref Range Status   Specimen Description   Final    BLOOD LEFT ANTECUBITAL Performed at [facility], [address] [place], [place] [address]    Special Requests   Final    BOTTLES DRAWN AEROBIC AND ANAEROBIC Blood Culture results may not be optimal due to an excessive volume of blood received in culture bottles Performed at [facility], [address] [place], [place] [address]    Culture  Setup Time   Final    Organism ID to follow [place] CRITICAL RESULT CALLED TO, READ BACK BY AND VERIFIED WITH: NATHAN BELUE @0011  ON 12/01/20 SKL    Culture METHICILLIN RESISTANT  STAPHYLOCOCCUS AUREUS (A)  Final   Report Status 12/03/2020 FINAL  Final   Organism ID, Bacteria METHICILLIN RESISTANT STAPHYLOCOCCUS AUREUS  Final      Susceptibility   Methicillin resistant staphylococcus aureus - MIC*    CIPROFLOXACIN >=8 RESISTANT Resistant     ERYTHROMYCIN >=8 RESISTANT Resistant     GENTAMICIN <=0.5 SENSITIVE Sensitive     OXACILLIN >=4 RESISTANT Resistant     TETRACYCLINE <=1 SENSITIVE Sensitive     VANCOMYCIN 1 SENSITIVE Sensitive     TRIMETH/SULFA <=10 SENSITIVE Sensitive     CLINDAMYCIN <=0.25 SENSITIVE Sensitive     RIFAMPIN <=0.5 SENSITIVE Sensitive     Inducible Clindamycin NEGATIVE Sensitive     * METHICILLIN RESISTANT STAPHYLOCOCCUS AUREUS  Blood Culture (routine x 2)     Status: Abnormal   Collection Time: 11/30/20  2:54 PM   Specimen: BLOOD  Result Value Ref Range Status   Specimen Description   Final    BLOOD BLOOD RIGHT HAND Performed at [facility], [address] [place], [place] [address]    Special Requests   Final    BOTTLES DRAWN AEROBIC AND ANAEROBIC Blood Culture adequate volume Performed at [facility], [address] [place], [place] [address]    Culture  Setup Time   Final    GRAM POSITIVE COCCI IN BOTH AEROBIC AND ANAEROBIC BOTTLES CRITICAL VALUE NOTED.  VALUE IS CONSISTENT WITH PREVIOUSLY REPORTED AND CALLED VALUE.    Culture (A)  Final    STAPHYLOCOCCUS AUREUS SUSCEPTIBILITIES PERFORMED ON PREVIOUS CULTURE WITHIN THE LAST 5 DAYS. Performed at [facility], [place] [address], [place], [place] [address]    Report Status 12/03/2020 FINAL  Final  Resp Panel by RT-PCR (Flu A&B, Covid) Nasopharyngeal Swab     Status: None   Collection Time: 11/30/20  2:54 PM   Specimen: Nasopharyngeal Swab; Nasopharyngeal(NP) swabs in vial transport medium  Result Value Ref Range Status   SARS Coronavirus 2 by RT PCR NEGATIVE NEGATIVE Final    Comment: (NOTE) SARS-CoV-2 target nucleic acids are NOT DETECTED.  The  SARS-CoV-2 RNA is generally detectable in upper respiratory specimens during the acute phase of infection. The lowest concentration of SARS-CoV-2 viral copies this assay can detect is 138 copies/mL. A negative result does not preclude SARS-Cov-2 infection and should not be used as the sole basis for treatment or other patient management decisions. A negative result may occur with  improper specimen collection/handling, submission of specimen other than nasopharyngeal swab, presence of viral mutation(s) within the areas targeted by this assay, and inadequate number of viral copies(<138 copies/mL). A negative result must be combined with clinical observations, patient history, and epidemiological information. The expected result is Negative.  Fact Sheet for Patients:  EntrepreneurPulse.com.au  Fact Sheet for Healthcare Providers:  IncredibleEmployment.be  This test is no t yet approved or cleared by the [place] FDA and  has been authorized for detection and/or diagnosis of SARS-CoV-2 by FDA under an Emergency Use Authorization (EUA). This EUA will remain  in effect (meaning this test can be used) for the duration of the COVID-19 declaration under Section 564(b)(1) of the Act, 21 U.S.C.section 360bbb-3(b)(1), unless the authorization is terminated  or revoked sooner.       Influenza A by PCR NEGATIVE NEGATIVE Final   Influenza B by PCR NEGATIVE NEGATIVE Final    Comment: (NOTE) The Xpert Xpress SARS-CoV-2/FLU/RSV plus assay is intended as an aid in the diagnosis of influenza from Nasopharyngeal swab specimens and should not be used as a sole basis for treatment. Nasal washings and aspirates are unacceptable for Xpert Xpress SARS-CoV-2/FLU/RSV testing.  Fact Sheet for Patients: EntrepreneurPulse.com.au  Fact Sheet for Healthcare Providers: IncredibleEmployment.be  This test is not yet approved or  cleared by the [place] FDA and has been authorized for detection and/or diagnosis of SARS-CoV-2 by FDA under an Emergency Use Authorization (EUA). This EUA will remain in effect (meaning this test can be used) for the duration of the COVID-19 declaration under Section 564(b)(1) of the Act, 21 U.S.C. section 360bbb-3(b)(1), unless the authorization is terminated or revoked.  Performed at [facility], [address] [place],  [address]   Blood Culture ID Panel (Reflexed)     Status: Abnormal   Collection Time: 11/30/20  2:54 PM  Result Value Ref Range Status   Enterococcus faecalis NOT DETECTED NOT DETECTED Final   Enterococcus Faecium NOT DETECTED NOT DETECTED Final   Listeria monocytogenes NOT DETECTED NOT DETECTED Final   Staphylococcus species DETECTED (A)  NOT DETECTED Final    Comment: CRITICAL RESULT CALLED TO, READ BACK BY AND VERIFIED WITH: NATHAN BELUE @0011  ON 12/01/20 SKL    Staphylococcus aureus (BCID) DETECTED (A) NOT DETECTED Final    Comment: Methicillin (oxacillin)-resistant Staphylococcus aureus (MRSA). MRSA is predictably resistant to beta-lactam antibiotics (except ceftaroline). Preferred therapy is vancomycin unless clinically contraindicated. Patient requires contact precautions if  hospitalized. CRITICAL RESULT CALLED TO, READ BACK BY AND VERIFIED WITH: NATHAN BELUE @0011  ON 12/01/20 SKL    Staphylococcus epidermidis NOT DETECTED NOT DETECTED Final   Staphylococcus lugdunensis NOT DETECTED NOT DETECTED Final   Streptococcus species NOT DETECTED NOT DETECTED Final   Streptococcus agalactiae NOT DETECTED NOT DETECTED Final   Streptococcus pneumoniae NOT DETECTED NOT DETECTED Final   Streptococcus pyogenes NOT DETECTED NOT DETECTED Final   A.calcoaceticus-baumannii NOT DETECTED NOT DETECTED Final   Bacteroides fragilis NOT DETECTED NOT DETECTED Final   Enterobacterales NOT DETECTED NOT DETECTED Final   Enterobacter cloacae complex NOT  DETECTED NOT DETECTED Final   Escherichia coli NOT DETECTED NOT DETECTED Final   Klebsiella aerogenes NOT DETECTED NOT DETECTED Final   Klebsiella oxytoca NOT DETECTED NOT DETECTED Final   Klebsiella pneumoniae NOT DETECTED NOT DETECTED Final   Proteus species NOT DETECTED NOT DETECTED Final   Salmonella species NOT DETECTED NOT DETECTED Final   Serratia marcescens NOT DETECTED NOT DETECTED Final   Haemophilus influenzae NOT DETECTED NOT DETECTED Final   Neisseria meningitidis NOT DETECTED NOT DETECTED Final   Pseudomonas aeruginosa NOT DETECTED NOT DETECTED Final   Stenotrophomonas maltophilia NOT DETECTED NOT DETECTED Final   Candida albicans NOT DETECTED NOT DETECTED Final   Candida auris NOT DETECTED NOT DETECTED Final   Candida glabrata NOT DETECTED NOT DETECTED Final   Candida krusei NOT DETECTED NOT DETECTED Final   Candida parapsilosis NOT DETECTED NOT DETECTED Final   Candida tropicalis NOT DETECTED NOT DETECTED Final   Cryptococcus neoformans/gattii NOT DETECTED NOT DETECTED Final   Meth resistant mecA/C and MREJ DETECTED (A) NOT DETECTED Final    Comment: CRITICAL RESULT CALLED TO, READ BACK BY AND VERIFIED WITH: NATHAN BELUE @0011  ON 12/01/20 SKL Performed at [facility], [address] [place], [place] [address]   Urine Culture     Status: None   Collection Time: 12/01/20  6:18 AM   Specimen: Urine, Clean Catch  Result Value Ref Range Status   Specimen Description   Final    URINE, CLEAN CATCH Performed at [facility], [address] [place], [place] [address]    Special Requests   Final    NONE Performed at [facility], [address] [place], [place] [address]    Culture   Final    NO GROWTH Performed at [facility], [address], [place], [place] [address]    Report Status 12/02/2020 FINAL  Final  MRSA Next Gen by PCR, Nasal     Status: Abnormal   Collection Time: 12/01/20  8:40 AM   Specimen: Nasal Mucosa; Nasal  Swab  Result Value Ref Range Status   MRSA by PCR Next Gen DETECTED (A) NOT DETECTED Final    Comment: RESULT CALLED TO, READ BACK BY AND VERIFIED WITH: CHARLIE FLEETWOOD 12/01/2020 1447 JGF Performed at [facility], [address] [place], [place] [address]   Aerobic Culture w Gram Stain (superficial specimen)     Status: None (Preliminary result)   Collection Time: 12/01/20 12:44 PM   Specimen: Wound  Result Value  Ref Range Status   Specimen Description   Final    WOUND Performed at [facility], [address] [place], [place] [address]    Special Requests   Final    NONE Performed at [facility], [place], [place] [address]    Gram Stain   Final    RARE WBC PRESENT, PREDOMINANTLY MONONUCLEAR RARE GRAM POSITIVE COCCI    Culture   Final    FEW STAPHYLOCOCCUS AUREUS SUSCEPTIBILITIES TO FOLLOW Performed at [facility], [place] [address] [place], [place] [address]    Report Status PENDING  Incomplete  Aerobic/Anaerobic Culture w Gram Stain (surgical/deep wound)     Status: None (Preliminary result)   Collection Time: 12/01/20  5:55 PM   Specimen: PATH Cytology Misc. fluid; Body Fluid  Result Value Ref Range Status   Specimen Description   Final    WOUND EPIDURALABSCESS Performed at [facility], [address], [place], [place] [address]    Special Requests   Final    NONE Performed at [facility], [place], [place] [address]    Gram Stain   Final    NO SQUAMOUS EPITHELIAL CELLS SEEN FEW WBC SEEN FEW GRAM POSITIVE COCCI    Culture   Final    FEW STAPHYLOCOCCUS AUREUS SUSCEPTIBILITIES TO FOLLOW Performed at [facility], [place] [address], [place], [place] [address]    Report Status PENDING  Incomplete  CULTURE, BLOOD (ROUTINE X 2) w Reflex to ID Panel     Status: None (Preliminary result)   Collection Time: 12/02/20  7:12 PM   Specimen: BLOOD  Result Value Ref Range  Status   Specimen Description BLOOD LEFT ANTECUBITAL  Final   Special Requests   Final    BOTTLES DRAWN AEROBIC AND ANAEROBIC Blood Culture adequate volume   Culture   Final    NO GROWTH < 12 HOURS Performed at [facility], [address], [place], [place] [address]    Report Status PENDING  Incomplete  CULTURE, BLOOD (ROUTINE X 2) w Reflex to ID Panel     Status: None (Preliminary result)   Collection Time: 12/02/20  7:13 PM   Specimen: BLOOD  Result Value Ref Range Status   Specimen Description BLOOD BLOOD LEFT WRIST  Final   Special Requests   Final    BOTTLES DRAWN AEROBIC AND ANAEROBIC Blood Culture adequate volume   Culture   Final    NO GROWTH < 12 HOURS Performed at [facility], [address] [place], [place] [address]    Report Status PENDING  Incomplete      Imaging Studies   DG Chest Port 1 View  Result Date: 12/02/2020 CLINICAL DATA:  Acute respiratory failure with hypoxia. EXAM: PORTABLE CHEST 1 VIEW COMPARISON:  December 01, 2020. FINDINGS: Stable cardiomediastinal silhouette. Endotracheal tube is in good position. Stable mild interstitial densities are noted which may represent minimal pulmonary edema or possibly atypical inflammation, or possibly scarring. Bony thorax is unremarkable. IMPRESSION: Stable mild interstitial densities are noted bilaterally as described above. Stable support apparatus. Electronically Signed   By: Marijo Conception M.D.   On: 12/02/2020 08:11   DG Chest Port 1 View  Result Date: 12/01/2020 CLINICAL DATA:  Status post intubation. EXAM: PORTABLE CHEST 1 VIEW COMPARISON:  Chest radiograph dated 12/01/2020. FINDINGS: Endotracheal tube with tip approximately 5 cm above the carina. Bilateral interstitial prominence, left greater right. No focal consolidation,  pleural effusion, pneumothorax. The cardiac silhouette is within normal limits. No acute osseous pathology. IMPRESSION: 1. Endotracheal tube above the carina. 2.  Bilateral interstitial prominence. No focal consolidation. Electronically Signed   By: Anner Crete M.D.   On: 12/01/2020 20:05     Medications   Scheduled Meds:  acetaminophen  1,000 mg Oral Q6H   Chlorhexidine Gluconate Cloth  6 each Topical Q0600   enoxaparin (LOVENOX) injection  40 mg Subcutaneous Q24H   feeding supplement  237 mL Oral TID BM   losartan  25 mg Oral Once   [START ON 12/04/2020] losartan  50 mg Oral Daily   [START ON 12/04/2020] multivitamin with minerals  1 tablet Oral Daily   mupirocin ointment  1 application Nasal BID   oxyCODONE  5 mg Oral Q6H   polyethylene glycol  17 g Oral Daily   senna-docusate  1 tablet Oral BID   Continuous Infusions:  ceFTAROline (TEFLARO) IV 100 mL/hr at 12/03/20 1505   lactated ringers 75 mL/hr at 12/03/20 1505   vancomycin Stopped (12/03/20 1324)       LOS: 3 days    Time spent: 25 minutes with greater than 50% at bedside in coordination of care    Ezekiel Slocumb, DO [facility]  12/03/2020, 6:27 PM      If 7PM-7AM, please contact night-coverage. How to contact the [facility] Attending or Consulting provider [place] or covering provider during after hours [place], for this patient?    Check the care team in [facility] and look for a) attending/consulting [facility] provider listed and b) the [facility] team listed Log into [email or web address] and use [facility] universal password to access. If you do not have the password, please contact the hospital operator. Locate the [facility] provider you are looking for under [facility] and page to a number that you can be directly reached. If you still have difficulty reaching the provider, please page the [facility] (Director on Call) for the Hospitalists listed on amion for assistance.

## 2020-12-03 NOTE — Progress Notes (Signed)
PHARMACY - PHYSICIAN COMMUNICATION CRITICAL VALUE ALERT - BLOOD CULTURE IDENTIFICATION (BCID)  Caleb Fisher is an 45 y.o. male who presented to [facility] on 11/30/2020 with a chief complaint of severe neck pain and confusion  Assessment: repeat Bcx from 11/17 growing MRSA in 1/4 bottles   Pervious Microbiology results:  11/15 BCx: 4 of 4 bottle w/ MRSA 11/15 CSFCx: Pending  11/16 Epidural absces: few GPCs 11/16 wound: few GPCs  Name of physician (or Provider) Contacted: N/A, pt already on appropriate abx and followed by ID   Current antibiotics: vancomycin and ceftaroline   Changes to prescribed antibiotics recommended:  Patient is on recommended antibiotics - No changes needed  Results for orders placed or performed during the hospital encounter of 11/30/20  Blood Culture ID Panel (Reflexed) (Collected: 12/02/2020  7:13 PM)  Result Value Ref Range   Enterococcus faecalis NOT DETECTED NOT DETECTED   Enterococcus Faecium NOT DETECTED NOT DETECTED   Listeria monocytogenes NOT DETECTED NOT DETECTED   Staphylococcus species DETECTED (A) NOT DETECTED   Staphylococcus aureus (BCID) DETECTED (A) NOT DETECTED   Staphylococcus epidermidis NOT DETECTED NOT DETECTED   Staphylococcus lugdunensis NOT DETECTED NOT DETECTED   Streptococcus species NOT DETECTED NOT DETECTED   Streptococcus agalactiae NOT DETECTED NOT DETECTED   Streptococcus pneumoniae NOT DETECTED NOT DETECTED   Streptococcus pyogenes NOT DETECTED NOT DETECTED   A.calcoaceticus-baumannii NOT DETECTED NOT DETECTED   Bacteroides fragilis NOT DETECTED NOT DETECTED   Enterobacterales NOT DETECTED NOT DETECTED   Enterobacter cloacae complex NOT DETECTED NOT DETECTED   Escherichia coli NOT DETECTED NOT DETECTED   Klebsiella aerogenes NOT DETECTED NOT DETECTED   Klebsiella oxytoca NOT DETECTED NOT DETECTED   Klebsiella pneumoniae NOT DETECTED NOT DETECTED   Proteus species NOT DETECTED NOT DETECTED   Salmonella species NOT  DETECTED NOT DETECTED   Serratia marcescens NOT DETECTED NOT DETECTED   Haemophilus influenzae NOT DETECTED NOT DETECTED   Neisseria meningitidis NOT DETECTED NOT DETECTED   Pseudomonas aeruginosa NOT DETECTED NOT DETECTED   Stenotrophomonas maltophilia NOT DETECTED NOT DETECTED   Candida albicans NOT DETECTED NOT DETECTED   Candida auris NOT DETECTED NOT DETECTED   Candida glabrata NOT DETECTED NOT DETECTED   Candida krusei NOT DETECTED NOT DETECTED   Candida parapsilosis NOT DETECTED NOT DETECTED   Candida tropicalis NOT DETECTED NOT DETECTED   Cryptococcus neoformans/gattii NOT DETECTED NOT DETECTED   Meth resistant mecA/C and MREJ DETECTED (A) NOT [place], PharmD 12/03/2020  9:00 PM

## 2020-12-04 DIAGNOSIS — G9341 Metabolic encephalopathy: Secondary | ICD-10-CM | POA: Diagnosis not present

## 2020-12-04 DIAGNOSIS — G061 Intraspinal abscess and granuloma: Secondary | ICD-10-CM | POA: Diagnosis not present

## 2020-12-04 DIAGNOSIS — R7881 Bacteremia: Secondary | ICD-10-CM | POA: Diagnosis not present

## 2020-12-04 DIAGNOSIS — I160 Hypertensive urgency: Secondary | ICD-10-CM | POA: Diagnosis not present

## 2020-12-04 LAB — AEROBIC CULTURE W GRAM STAIN (SUPERFICIAL SPECIMEN)

## 2020-12-04 LAB — CBC
HCT: 30.1 % — ABNORMAL LOW (ref 39.0–52.0)
Hemoglobin: 10.4 g/dL — ABNORMAL LOW (ref 13.0–17.0)
MCH: 29.6 pg (ref 26.0–34.0)
MCHC: 34.6 g/dL (ref 30.0–36.0)
MCV: 85.8 fL (ref 80.0–100.0)
Platelets: 124 10*3/uL — ABNORMAL LOW (ref 150–400)
RBC: 3.51 MIL/uL — ABNORMAL LOW (ref 4.22–5.81)
RDW: 15.6 % — ABNORMAL HIGH (ref 11.5–15.5)
WBC: 20.8 10*3/uL — ABNORMAL HIGH (ref 4.0–10.5)
nRBC: 0 % (ref 0.0–0.2)

## 2020-12-04 LAB — BASIC METABOLIC PANEL
Anion gap: 6 (ref 5–15)
BUN: 38 mg/dL — ABNORMAL HIGH (ref 6–20)
CO2: 28 mmol/L (ref 22–32)
Calcium: 7.9 mg/dL — ABNORMAL LOW (ref 8.9–10.3)
Chloride: 104 mmol/L (ref 98–111)
Creatinine, Ser: 0.62 mg/dL (ref 0.61–1.24)
GFR, Estimated: >60 mL/min (ref >60)
Glucose, Bld: 110 mg/dL — ABNORMAL HIGH (ref 70–99)
Potassium: 3.3 mmol/L — ABNORMAL LOW (ref 3.5–5.1)
Sodium: 138 mmol/L (ref 135–145)

## 2020-12-04 MED ORDER — METHOCARBAMOL 500 MG PO TABS
500.0000 mg | ORAL_TABLET | Freq: Four times a day (QID) | ORAL | Status: DC | PRN
Start: 2020-12-04 — End: 2020-12-17
  Administered 2020-12-04 (×2): 1000 mg via ORAL
  Administered 2020-12-04: 500 mg via ORAL
  Administered 2020-12-05 (×2): 1000 mg via ORAL
  Administered 2020-12-06 – 2020-12-07 (×2): 500 mg via ORAL
  Administered 2020-12-07 – 2020-12-08 (×2): 1000 mg via ORAL
  Administered 2020-12-09: 500 mg via ORAL
  Administered 2020-12-10 – 2020-12-11 (×2): 1000 mg via ORAL
  Administered 2020-12-11: 500 mg via ORAL
  Administered 2020-12-11 – 2020-12-16 (×6): 1000 mg via ORAL
  Administered 2020-12-16: 500 mg via ORAL
  Filled 2020-12-04 (×6): qty 2
  Filled 2020-12-04: qty 1
  Filled 2020-12-04 (×4): qty 2
  Filled 2020-12-04: qty 1
  Filled 2020-12-04 (×5): qty 2
  Filled 2020-12-04 (×2): qty 1
  Filled 2020-12-04 (×5): qty 2

## 2020-12-04 MED ORDER — POTASSIUM CHLORIDE CRYS ER 20 MEQ PO TBCR
40.0000 meq | EXTENDED_RELEASE_TABLET | ORAL | Status: AC
  Administered 2020-12-04 (×2): 40 meq via ORAL
  Filled 2020-12-04: qty 2

## 2020-12-04 MED ORDER — AMLODIPINE BESYLATE 10 MG PO TABS
10.0000 mg | ORAL_TABLET | Freq: Every day | ORAL | Status: DC
  Administered 2020-12-04 – 2020-12-17 (×13): 10 mg via ORAL
  Filled 2020-12-04 (×14): qty 1

## 2020-12-04 NOTE — Progress Notes (Signed)
    Attending Progress Note  History: Caleb Fisher is a 45 y.o male presenting with AMS and sepsis. Blood cultures showed MRSA bacteremia. 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 Pt underwent C2-4 right hemilaminectomies for evacuation of epidural on 12/01/20  POD3: NAEO. Having some pain.   POD2: NAEO. Pt extubated yesterday. Complains of neck pain and generalized fatigue. Remains on Cardene drip.  POD1: Pt remained intubated overnight for agitation.  Physical Exam: Vitals:   12/04/20 0700 12/04/20 0800  BP: (!) 162/107 (!) 154/102  Pulse:    Resp: (!) 24 20  Temp: 98.1 F (36.7 C)   SpO2: 99%      CNI AAOx3 Follows commands x 4. Moves all extremities 5/5 Incision -dressing in place, c/d/i  Data:  Recent Labs  Lab 12/02/20 1912 12/03/20 0755 12/04/20 0428  NA 138 138 138  K 3.1* 3.4* 3.3*  CL 106 103 104  CO2 27 29 28   BUN 38* 33* 38*  CREATININE 0.69 0.61 0.62  GLUCOSE 181* 191* 110*  CALCIUM 8.3* 7.8* 7.9*   Recent Labs  Lab 12/03/20 0755  AST 33  ALT 27  ALKPHOS 111     Recent Labs  Lab 12/01/20 1047 12/02/20 0327 12/04/20 0428  WBC 15.6* 15.2* 20.8*  HGB 11.0* 11.3* 10.4*  HCT 31.8* 32.6* 30.1*  PLT 104* 92* 124*   Recent Labs  Lab 12/01/20 0147 12/01/20 1047 12/02/20 0327  APTT 41* 40* 33  INR 1.3* 1.4* 1.2         Other tests/results:  MRI C-spine 11/30/20 IMPRESSION: 1. 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 2. No spinal cord signal change. 3. Small prevertebral effusion from C2-C5. 4. No acute intracranial abnormality. 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   Electronically Signed: By: Caleb Fisher M.D. On: 11/30/2020 21:51    Assessment/Plan:  Caleb Fisher is a 45 y.o presenting with MRSA bacteremia and a cervical epidural abscess  status post C2-4 right hemilaminectomies for evacuation of epidural on 12/01/20.  - ABX coverage per ID recommendations - no activity restrictions from a neurosurgical standpoint - OK to remove dressing today - q4 hr neuro checks - Continue DVT PPX - Remainder of plan per primary team and ID - Pain control - methocarbamol, toradol, acetaminophen, oxycodone.  Would limit narcotics.  Meade Maw MD Department of Neurosurgery

## 2020-12-04 NOTE — Progress Notes (Signed)
PROGRESS NOTE    Caleb Fisher   CHE:[id number]  DOB: 01/15/1976  PCP: Pcp, No    DOA: 11/30/2020 LOS: 4    Brief Narrative / Hospital Course to Date:   45 year old male with no past medical history other than tobacco abuse, presented to the ED from home on 11/30/2020 with altered mental status, increased back pain and new onset neck pain, and frequent falls.  Evaluation in the ED was consistent with severe sepsis from unclear source, with tachycardia and tachypnea.  Subsequently found to have MRSA bacteremia.  LP was attempted but unable to be completed due to patient agitation and unable to lie still.  Further imaging of the spine revealed a cervical epidural abscess involving C2-C5.  Started on empiric broad-spectrum antibiotics.  Labs also notable for profound hypokalemia, thrombocytopenia.  Admitted to [facility] service with infectious disease, neurosurgery, neurology and PCCM consulted.  Transferred to ICU.  Assessment & Plan   Active Problems:   Encephalopathy acute   Altered mental status   MRSA bacteremia   Abscess in epidural space of cervical spine   Hypertensive urgency   Hypokalemia   Thrombocytopenia (HCC)   Acute metabolic encephalopathy   Severe sepsis (HCC)   Severe sepsis -POA as evidenced by tachycardia, tachypnea, fevers, acute encephalopathy consistent with organ dysfunction and severe sepsis. 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  ?Developing ICU delirium.  Most likely due to infection/sepsis, ?opioid withdrawal. LP on admisison was unable to be obtained after multiple attempts, due to patient agitation and inability to lay still. Briefly on Precedex POD1 s/p extubation in ICU -- Treat infection and other underlying conditions as outlined --Manage pain adequately --Aspiration precautions --Delirium precautions  Thrombocytopenia -Improving.  Likely due to sepsis, DIC was a concern. S/p platelet transfusions early in admission. -- Hematology consulted  Acute kidney injury -Resolved.  Likely due to sepsis, dehydration, possibly NSAID use. Renal function improved with IV fluids. --Monitor BMP  Hypokalemia -refractory.  11/19: K 3.3 K continues to be replaced. With hypertension as well, consider hyperaldosteronism --Replace w PO Kcl 40 mEq x 2 today --Pending urine potassium creatinine --Pending aldo/renin activity ratio --Monitor BMP, replace as needed  Hypertension with hypertensive urgency - --Stop Cardene drip (11/18) --Started losartan 25>>50 mg daily --Amlodipine 10 mg added --Monitor renal function & electrolytes --PRN hydralazine PO or IV labetalol --Monitor BP closely --Transfer out of ICU if BP stable of Cardene gtt  Transaminitis - Resolved. POA with AST elevated, possibly due to shock liver versus alcohol abuse although wife reports patient seldom drinks. Right upper quadrant ultrasound showed gallbladder sludge without cholelithiasis, nonobstructing right renal calculi similar to that seen on prior CT. --Follow LFTs   Patient BMI: Body mass index is 23.9 kg/m.   DVT prophylaxis: enoxaparin (LOVENOX) injection 40 mg Start: 12/02/20 2200 SCD's Start: 12/01/20 1907   Diet:  Diet Orders (From admission, onward)     Start     Ordered   12/02/20 1505  Diet regular Room service  appropriate? Yes; Fluid consistency: Thin  Diet effective now        Question Answer Comment  Room service appropriate? Yes   Fluid consistency: Thin      12/02/20 1504              Code Status: Full Code   Subjective 12/04/20    Per bedside RN, patient had a rough night with increased pain in his neck and some episodes of confusion, possible ICU delirium.  Patient sleeping but wakes easily when seen, quickly falls back to sleep.  His wife and best friend are at the bedside today.  Wife reports of long history of the patient having episodes of confusion, intermittent blackout episodes, questionable hallucinations which she notes started before the beginning of [place] pandemic.  Wonders about whether all this time has been infection brewing or if some other neurological issue and hopes to get to the bottom of it.  We discussed monitoring this through the end of his antibiotic course and follow-up for further evaluation if still having issues.   Disposition Plan & Communication   Status is: Inpatient  Remains inpatient appropriate because: Severity of illness.  Remains on IV therapies with evaluation pending for MRSA bacteremia and cervical spinal abscess.     Family Communication: Wife and best friend at bedside during encounter 11/19.   Consults, Procedures, Significant Events   Consultants:  Infectious disease Neurosurgery PCCM Neurology Hematology/oncology  Procedures:  Washout of cervical spinal epidural abscess  Antimicrobials:  Anti-infectives (From admission, onward)    Start     Dose/Rate Route Frequency Ordered Stop   12/03/20 1000  vancomycin (VANCOREADY) IVPB 1000 mg/200 mL  Status:  Discontinued        1,000 mg 200 mL/hr over 60 Minutes Intravenous Every 12 hours 12/03/20 0850 12/03/20 0850   12/03/20 1000  vancomycin (VANCOREADY) IVPB 1500 mg/300 mL        1,500 mg 150 mL/hr over 120 Minutes Intravenous Every 12 hours 12/03/20 0850     12/01/20 1630  ceftaroline (TEFLARO) 600 mg in sodium chloride 0.9 % 100 mL IVPB         600 mg 100 mL/hr over 60 Minutes Intravenous Every 8 hours 12/01/20 1533     12/01/20 0800  cefTRIAXone (ROCEPHIN) 2 g in sodium chloride 0.9 % 100 mL IVPB  Status:  Discontinued        2 g 200 mL/hr over 30 Minutes Intravenous Every 24 hours 11/30/20 2311 12/01/20 0032   12/01/20 0700  vancomycin (VANCOREADY) IVPB 1000 mg/200 mL  Status:  Discontinued        1,000 mg 200 mL/hr over 60 Minutes Intravenous Every 12 hours 11/30/20 2319 12/03/20 0850   12/01/20 0700  ceFEPIme (MAXIPIME) 2 g in sodium chloride 0.9 % 100 mL IVPB  Status:  Discontinued        2 g 200 mL/hr over 30 Minutes Intravenous Every 8 hours 12/01/20 0054 12/01/20 0128   12/01/20 0618  metroNIDAZOLE (FLAGYL) IVPB 500 mg  Status:  Discontinued        500 mg 100 mL/hr over 60 Minutes Intravenous Every 6 hours 12/01/20 0031 12/01/20 1237   12/01/20 0600  cefTRIAXone (ROCEPHIN) 2 g in sodium chloride 0.9 % 100 mL IVPB  Status:  Discontinued        2 g 200 mL/hr over 30 Minutes Intravenous Every 12 hours 12/01/20 0035 12/01/20 0049   12/01/20 0045  cefTRIAXone (ROCEPHIN) 2 g in sodium chloride 0.9 %  100 mL IVPB  Status:  Discontinued        2 g 200 mL/hr over 30 Minutes Intravenous Every 12 hours 12/01/20 0032 12/01/20 0035   12/01/20 0000  metroNIDAZOLE (FLAGYL) IVPB 500 mg  Status:  Discontinued        500 mg 100 mL/hr over 60 Minutes Intravenous Every 12 hours 11/30/20 2311 12/01/20 0031   11/30/20 2300  ceFEPIme (MAXIPIME) 2 g in sodium chloride 0.9 % 100 mL IVPB  Status:  Discontinued        2 g 200 mL/hr over 30 Minutes Intravenous Every 8 hours 11/30/20 1816 12/01/20 0000   11/30/20 1930  acyclovir (ZOVIRAX) 725 mg in dextrose 5 % 150 mL IVPB  Status:  Discontinued        10 mg/kg  72.6 kg 164.5 mL/hr over 60 Minutes Intravenous Every 8 hours 11/30/20 1915 12/01/20 0032   11/30/20 1500  vancomycin (VANCOREADY) IVPB 1750 mg/350 mL        1,750 mg 175 mL/hr over 120 Minutes Intravenous  Once 11/30/20 1443 11/30/20  2200   11/30/20 1445  ceFEPIme (MAXIPIME) 2 g in sodium chloride 0.9 % 100 mL IVPB        2 g 200 mL/hr over 30 Minutes Intravenous  Once 11/30/20 1436 11/30/20 1728   11/30/20 1445  metroNIDAZOLE (FLAGYL) IVPB 500 mg        500 mg 100 mL/hr over 60 Minutes Intravenous  Once 11/30/20 1436 11/30/20 1835   11/30/20 1445  vancomycin (VANCOCIN) IVPB 1000 mg/200 mL premix  Status:  Discontinued        1,000 mg 200 mL/hr over 60 Minutes Intravenous  Once 11/30/20 1436 11/30/20 1443         Micro    Objective   Vitals:   12/04/20 0923 12/04/20 1000 12/04/20 1100 12/04/20 1200  BP: (!) 154/96 (!) 150/102 (!) 152/120 (!) 155/96  Pulse:      Resp:  (!) 21 19 (!) 24  Temp:    98.2 F (36.8 C)  TempSrc:    Oral  SpO2:    97%  Weight:      Height:        Intake/Output Summary (Last 24 hours) at 12/04/2020 1543 Last data filed at 12/04/2020 8676 Gross per 24 hour  Intake 1769.13 ml  Output 1950 ml  Net -180.87 ml   Filed Weights   11/30/20 1311 11/30/20 2216  Weight: 72.6 kg 73.4 kg    Physical Exam:  General exam: Sleeping semireclined in bed, wakes easily and quickly returns to sleep, sitting up in bed, , no acute distress Respiratory system: Lungs clear bilaterally, normal respiratory effort, on room air. Cardiovascular system: regular rate and rhythm, no murmurs heard Central nervous system: A&Ox4, no gross focal deficits, normal speech Extremities: no edema, moves all, normal tone Psychiatry: normal mood, intact judgement and insight  Labs   Data Reviewed: I have personally reviewed following labs and imaging studies  CBC: Recent Labs  Lab 11/30/20 1310 12/01/20 0030 12/01/20 0147 12/01/20 0157 12/01/20 1047 12/02/20 0327 12/04/20 0428  WBC 14.2* 8.8  --  13.0* 15.6* 15.2* 20.8*  NEUTROABS 12.8*  --   --   --  14.0* 13.5*  --   HGB 12.8* 9.8*  --  11.3* 11.0* 11.3* 10.4*  HCT 36.6* 28.1*  --  32.6* 31.8* 32.6* 30.1*  MCV 85.1 85.9  --  83.8 84.4 84.9 85.8   PLT 125* NO PLT 77* 80* 104*  92* 779*   Basic Metabolic Panel: Recent Labs  Lab 11/30/20 1310 12/01/20 0157 12/01/20 1047 12/02/20 0327 12/02/20 1309 12/02/20 1912 12/03/20 0755 12/04/20 0428  NA 135   < > 136 139  --  138 138 138  K 3.0*   < > 2.3* 2.6* 2.9* 3.1* 3.4* 3.3*  CL 99   < > 102 100  --  106 103 104  CO2 23   < > 23 26  --  27 29 28   GLUCOSE 117*   < > 83 171*  --  181* 191* 110*  BUN 54*   < > 34* 36*  --  38* 33* 38*  CREATININE 1.58*   < > 1.01 0.76  --  0.69 0.61 0.62  CALCIUM 8.4*   < > 8.1* 8.1*  --  8.3* 7.8* 7.9*  MG 2.4  --   --  2.3  --   --   --   --    < > = values in this interval not displayed.   GFR: Estimated Creatinine Clearance: 115.4 mL/min (by C-G formula based on SCr of 0.62 mg/dL). Liver Function Tests: Recent Labs  Lab 11/30/20 1310 12/01/20 0157 12/02/20 0327 12/03/20 0755  AST 102* 97* 40 33  ALT 55* 49* 32 27  ALKPHOS 166* 129* 107 111  BILITOT 1.1 1.5* 1.4* 1.1  PROT 6.5 5.3* 5.2* 5.1*  ALBUMIN 2.9* 2.2* 1.9* 2.0*   No results for input(s): LIPASE, AMYLASE in the last 168 hours. Recent Labs  Lab 11/30/20 1454  AMMONIA 14   Coagulation Profile: Recent Labs  Lab 11/30/20 1310 12/01/20 0147 12/01/20 1047 12/02/20 0327  INR 1.2 1.3* 1.4* 1.2   Cardiac Enzymes: No results for input(s): CKTOTAL, CKMB, CKMBINDEX, TROPONINI in the last 168 hours. BNP (last 3 results) No results for input(s): PROBNP in the last 8760 hours. HbA1C: No results for input(s): HGBA1C in the last 72 hours.  CBG: Recent Labs  Lab 12/01/20 1131  GLUCAP 97   Lipid Profile: Recent Labs    12/02/20 0327  TRIG 198*   Thyroid Function Tests: No results for input(s): TSH, T4TOTAL, FREET4, T3FREE, THYROIDAB in the last 72 hours.  Anemia Panel: No results for input(s): VITAMINB12, FOLATE, FERRITIN, TIBC, IRON, RETICCTPCT in the last 72 hours. Sepsis Labs: Recent Labs  Lab 11/30/20 1310 11/30/20 1643 11/30/20 2237 12/01/20 0147  12/01/20 0157  PROCALCITON 17.28  --   --   --  22.89  LATICACIDVEN 2.0* 2.2* 1.7 1.9  --     Recent Results (from the past 240 hour(s))  Blood Culture (routine x 2)     Status: Abnormal   Collection Time: 11/30/20  2:54 PM   Specimen: BLOOD  Result Value Ref Range Status   Specimen Description   Final    BLOOD LEFT ANTECUBITAL Performed at [facility], [address] [place], [place] [address]    Special Requests   Final    BOTTLES DRAWN AEROBIC AND ANAEROBIC Blood Culture results may not be optimal due to an excessive volume of blood received in culture bottles Performed at [facility], [address] [place], [place] [address]    Culture  Setup Time   Final    Organism ID to follow [place] CRITICAL RESULT CALLED TO, READ BACK BY AND VERIFIED WITH: NATHAN BELUE @0011  ON 12/01/20 SKL    Culture METHICILLIN RESISTANT STAPHYLOCOCCUS AUREUS (A)  Final   Report  Status 12/03/2020 FINAL  Final   Organism ID, Bacteria METHICILLIN RESISTANT STAPHYLOCOCCUS AUREUS  Final      Susceptibility   Methicillin resistant staphylococcus aureus - MIC*    CIPROFLOXACIN >=8 RESISTANT Resistant     ERYTHROMYCIN >=8 RESISTANT Resistant     GENTAMICIN <=0.5 SENSITIVE Sensitive     OXACILLIN >=4 RESISTANT Resistant     TETRACYCLINE <=1 SENSITIVE Sensitive     VANCOMYCIN 1 SENSITIVE Sensitive     TRIMETH/SULFA <=10 SENSITIVE Sensitive     CLINDAMYCIN <=0.25 SENSITIVE Sensitive     RIFAMPIN <=0.5 SENSITIVE Sensitive     Inducible Clindamycin NEGATIVE Sensitive     * METHICILLIN RESISTANT STAPHYLOCOCCUS AUREUS  Blood Culture (routine x 2)     Status: Abnormal   Collection Time: 11/30/20  2:54 PM   Specimen: BLOOD  Result Value Ref Range Status   Specimen Description   Final    BLOOD BLOOD RIGHT HAND Performed at [facility], [address], [place], [place] [address]    Special Requests   Final    BOTTLES  DRAWN AEROBIC AND ANAEROBIC Blood Culture adequate volume Performed at [facility], [address] [place], [place] [address]    Culture  Setup Time   Final    GRAM POSITIVE COCCI IN BOTH AEROBIC AND ANAEROBIC BOTTLES CRITICAL VALUE NOTED.  VALUE IS CONSISTENT WITH PREVIOUSLY REPORTED AND CALLED VALUE.    Culture (A)  Final    STAPHYLOCOCCUS AUREUS SUSCEPTIBILITIES PERFORMED ON PREVIOUS CULTURE WITHIN THE LAST 5 DAYS. Performed at [facility], [place] [address] [place], [place] [address]    Report Status 12/03/2020 FINAL  Final  Resp Panel by RT-PCR (Flu A&B, Covid) Nasopharyngeal Swab     Status: None   Collection Time: 11/30/20  2:54 PM   Specimen: Nasopharyngeal Swab; Nasopharyngeal(NP) swabs in vial transport medium  Result Value Ref Range Status   SARS Coronavirus 2 by RT PCR NEGATIVE NEGATIVE Final    Comment: (NOTE) SARS-CoV-2 target nucleic acids are NOT DETECTED.  The SARS-CoV-2 RNA is generally detectable in upper respiratory specimens during the acute phase of infection. The lowest concentration of SARS-CoV-2 viral copies this assay can detect is 138 copies/mL. A negative result does not preclude SARS-Cov-2 infection and should not be used as the sole basis for treatment or other patient management decisions. A negative result may occur with  improper specimen collection/handling, submission of specimen other than nasopharyngeal swab, presence of viral mutation(s) within the areas targeted by this assay, and inadequate number of viral copies(<138 copies/mL). A negative result must be combined with clinical observations, patient history, and epidemiological information. The expected result is Negative.  Fact Sheet for Patients:  EntrepreneurPulse.com.au  Fact Sheet for Healthcare Providers:  IncredibleEmployment.be  This test is no t yet approved or cleared by the [place] FDA and  has been authorized for  detection and/or diagnosis of SARS-CoV-2 by FDA under an Emergency Use Authorization (EUA). This EUA will remain  in effect (meaning this test can be used) for the duration of the COVID-19 declaration under Section 564(b)(1) of the Act, 21 U.S.C.section 360bbb-3(b)(1), unless the authorization is terminated  or revoked sooner.       Influenza A by PCR NEGATIVE NEGATIVE Final   Influenza B by PCR NEGATIVE NEGATIVE Final    Comment: (NOTE) The Xpert Xpress SARS-CoV-2/FLU/RSV plus assay is intended as an aid in the diagnosis of influenza from Nasopharyngeal swab specimens and should not be used as  a sole basis for treatment. Nasal washings and aspirates are unacceptable for Xpert Xpress SARS-CoV-2/FLU/RSV testing.  Fact Sheet for Patients: EntrepreneurPulse.com.au  Fact Sheet for Healthcare Providers: IncredibleEmployment.be  This test is not yet approved or cleared by the [place] FDA and has been authorized for detection and/or diagnosis of SARS-CoV-2 by FDA under an Emergency Use Authorization (EUA). This EUA will remain in effect (meaning this test can be used) for the duration of the COVID-19 declaration under Section 564(b)(1) of the Act, 21 U.S.C. section 360bbb-3(b)(1), unless the authorization is terminated or revoked.  Performed at [facility], [address] [place], [place] [address]   Blood Culture ID Panel (Reflexed)     Status: Abnormal   Collection Time: 11/30/20  2:54 PM  Result Value Ref Range Status   Enterococcus faecalis NOT DETECTED NOT DETECTED Final   Enterococcus Faecium NOT DETECTED NOT DETECTED Final   Listeria monocytogenes NOT DETECTED NOT DETECTED Final   Staphylococcus species DETECTED (A) NOT DETECTED Final    Comment: CRITICAL RESULT CALLED TO, READ BACK BY AND VERIFIED WITH: NATHAN BELUE @0011  ON 12/01/20 SKL    Staphylococcus aureus (BCID) DETECTED (A) NOT DETECTED Final    Comment:  Methicillin (oxacillin)-resistant Staphylococcus aureus (MRSA). MRSA is predictably resistant to beta-lactam antibiotics (except ceftaroline). Preferred therapy is vancomycin unless clinically contraindicated. Patient requires contact precautions if  hospitalized. CRITICAL RESULT CALLED TO, READ BACK BY AND VERIFIED WITH: NATHAN BELUE @0011  ON 12/01/20 SKL    Staphylococcus epidermidis NOT DETECTED NOT DETECTED Final   Staphylococcus lugdunensis NOT DETECTED NOT DETECTED Final   Streptococcus species NOT DETECTED NOT DETECTED Final   Streptococcus agalactiae NOT DETECTED NOT DETECTED Final   Streptococcus pneumoniae NOT DETECTED NOT DETECTED Final   Streptococcus pyogenes NOT DETECTED NOT DETECTED Final   A.calcoaceticus-baumannii NOT DETECTED NOT DETECTED Final   Bacteroides fragilis NOT DETECTED NOT DETECTED Final   Enterobacterales NOT DETECTED NOT DETECTED Final   Enterobacter cloacae complex NOT DETECTED NOT DETECTED Final   Escherichia coli NOT DETECTED NOT DETECTED Final   Klebsiella aerogenes NOT DETECTED NOT DETECTED Final   Klebsiella oxytoca NOT DETECTED NOT DETECTED Final   Klebsiella pneumoniae NOT DETECTED NOT DETECTED Final   Proteus species NOT DETECTED NOT DETECTED Final   Salmonella species NOT DETECTED NOT DETECTED Final   Serratia marcescens NOT DETECTED NOT DETECTED Final   Haemophilus influenzae NOT DETECTED NOT DETECTED Final   Neisseria meningitidis NOT DETECTED NOT DETECTED Final   Pseudomonas aeruginosa NOT DETECTED NOT DETECTED Final   Stenotrophomonas maltophilia NOT DETECTED NOT DETECTED Final   Candida albicans NOT DETECTED NOT DETECTED Final   Candida auris NOT DETECTED NOT DETECTED Final   Candida glabrata NOT DETECTED NOT DETECTED Final   Candida krusei NOT DETECTED NOT DETECTED Final   Candida parapsilosis NOT DETECTED NOT DETECTED Final   Candida tropicalis NOT DETECTED NOT DETECTED Final   Cryptococcus neoformans/gattii NOT DETECTED NOT DETECTED  Final   Meth resistant mecA/C and MREJ DETECTED (A) NOT DETECTED Final    Comment: CRITICAL RESULT CALLED TO, READ BACK BY AND VERIFIED WITH: NATHAN BELUE @0011  ON 12/01/20 SKL Performed at [facility], [address], [place], [place] [address]   Urine Culture     Status: None   Collection Time: 12/01/20  6:18 AM   Specimen: Urine, Clean Catch  Result Value Ref Range Status   Specimen Description   Final    URINE, CLEAN CATCH Performed at [facility], [place]  [address], [place], [place] [address]    Special Requests   Final    NONE Performed at [facility], [address] [place], [place] [address]    Culture   Final    NO GROWTH Performed at [facility], [place] [address], [place], [place] [address]    Report Status 12/02/2020 FINAL  Final  MRSA Next Gen by PCR, Nasal     Status: Abnormal   Collection Time: 12/01/20  8:40 AM   Specimen: Nasal Mucosa; Nasal Swab  Result Value Ref Range Status   MRSA by PCR Next Gen DETECTED (A) NOT DETECTED Final    Comment: RESULT CALLED TO, READ BACK BY AND VERIFIED WITH: CHARLIE FLEETWOOD 12/01/2020 1447 JGF Performed at [facility], [place], [place] [address]   Aerobic Culture w Gram Stain (superficial specimen)     Status: None (Preliminary result)   Collection Time: 12/01/20 12:44 PM   Specimen: Wound  Result Value Ref Range Status   Specimen Description   Final    WOUND Performed at [facility], [address], [place], [place] [address]    Special Requests   Final    NONE Performed at [facility], [address] [place], [place] [address]    Gram Stain   Final    RARE WBC PRESENT, PREDOMINANTLY MONONUCLEAR RARE GRAM POSITIVE COCCI    Culture   Final    FEW STAPHYLOCOCCUS AUREUS SUSCEPTIBILITIES TO FOLLOW Performed at [facility], [place] [address] [place], [place] [address]    Report Status PENDING  Incomplete  Aerobic/Anaerobic  Culture w Gram Stain (surgical/deep wound)     Status: None (Preliminary result)   Collection Time: 12/01/20  5:55 PM   Specimen: PATH Cytology Misc. fluid; Body Fluid  Result Value Ref Range Status   Specimen Description   Final    WOUND EPIDURALABSCESS Performed at [facility], [address] [place], [place] [address]    Special Requests   Final    NONE Performed at [facility], [place], [place] [address]    Gram Stain   Final    NO SQUAMOUS EPITHELIAL CELLS SEEN FEW WBC SEEN FEW GRAM POSITIVE COCCI    Culture   Final    FEW STAPHYLOCOCCUS AUREUS SUSCEPTIBILITIES TO FOLLOW Performed at [facility], [place] [address] [place], [place] [address]    Report Status PENDING  Incomplete  CULTURE, BLOOD (ROUTINE X 2) w Reflex to ID Panel     Status: None (Preliminary result)   Collection Time: 12/02/20  7:12 PM   Specimen: BLOOD  Result Value Ref Range Status   Specimen Description BLOOD LEFT ANTECUBITAL  Final   Special Requests   Final    BOTTLES DRAWN AEROBIC AND ANAEROBIC Blood Culture adequate volume   Culture  Setup Time   Final    GRAM POSITIVE COCCI ANAEROBIC BOTTLE ONLY CRITICAL VALUE NOTED.  VALUE IS CONSISTENT WITH PREVIOUSLY REPORTED AND CALLED VALUE. Performed at [facility], [address] [place], [place] [address]    Culture [facility] POSITIVE COCCI  Final   Report Status PENDING  Incomplete  CULTURE, BLOOD (ROUTINE X 2) w Reflex to ID Panel     Status: Abnormal (Preliminary result)   Collection Time: 12/02/20  7:13 PM   Specimen: BLOOD  Result Value Ref Range Status   Specimen Description   Final    BLOOD BLOOD LEFT WRIST  Performed at [facility], [address], [place], [place] [address]    Special Requests   Final    BOTTLES DRAWN AEROBIC AND ANAEROBIC Blood Culture adequate volume Performed at [facility], [address] [place], [place] [address]    Culture  Setup Time   Final     GRAM POSITIVE COCCI ANAEROBIC BOTTLE ONLY CRITICAL RESULT CALLED TO, READ BACK BY AND VERIFIED WITH: SHEEMA HALLAJI AT 2030 ON 12/03/20 BY SS    Culture (A)  Final    STAPHYLOCOCCUS AUREUS SUSCEPTIBILITIES PERFORMED ON PREVIOUS CULTURE WITHIN THE LAST 5 DAYS. Performed at [facility], [place] [address] [place], [place] [address]    Report Status PENDING  Incomplete  Blood Culture ID Panel (Reflexed)     Status: Abnormal   Collection Time: 12/02/20  7:13 PM  Result Value Ref Range Status   Enterococcus faecalis NOT DETECTED NOT DETECTED Final   Enterococcus Faecium NOT DETECTED NOT DETECTED Final   Listeria monocytogenes NOT DETECTED NOT DETECTED Final   Staphylococcus species DETECTED (A) NOT DETECTED Final    Comment: CRITICAL RESULT CALLED TO, READ BACK BY AND VERIFIED WITH: SHEEMA HALLAJI AT 2030 ON 12/03/20 BY SS    Staphylococcus aureus (BCID) DETECTED (A) NOT DETECTED Final    Comment: Methicillin (oxacillin)-resistant Staphylococcus aureus (MRSA). MRSA is predictably resistant to beta-lactam antibiotics (except ceftaroline). Preferred therapy is vancomycin unless clinically contraindicated. Patient requires contact precautions if  hospitalized. CRITICAL RESULT CALLED TO, READ BACK BY AND VERIFIED WITH: SHEEMA HALLAJI AT 2030 ON 12/03/20 BY SS    Staphylococcus epidermidis NOT DETECTED NOT DETECTED Final   Staphylococcus lugdunensis NOT DETECTED NOT DETECTED Final   Streptococcus species NOT DETECTED NOT DETECTED Final   Streptococcus agalactiae NOT DETECTED NOT DETECTED Final   Streptococcus pneumoniae NOT DETECTED NOT DETECTED Final   Streptococcus pyogenes NOT DETECTED NOT DETECTED Final   A.calcoaceticus-baumannii NOT DETECTED NOT DETECTED Final   Bacteroides fragilis NOT DETECTED NOT DETECTED Final   Enterobacterales NOT DETECTED NOT DETECTED Final   Enterobacter cloacae complex NOT DETECTED NOT DETECTED Final   Escherichia coli NOT DETECTED NOT DETECTED Final    Klebsiella aerogenes NOT DETECTED NOT DETECTED Final   Klebsiella oxytoca NOT DETECTED NOT DETECTED Final   Klebsiella pneumoniae NOT DETECTED NOT DETECTED Final   Proteus species NOT DETECTED NOT DETECTED Final   Salmonella species NOT DETECTED NOT DETECTED Final   Serratia marcescens NOT DETECTED NOT DETECTED Final   Haemophilus influenzae NOT DETECTED NOT DETECTED Final   Neisseria meningitidis NOT DETECTED NOT DETECTED Final   Pseudomonas aeruginosa NOT DETECTED NOT DETECTED Final   Stenotrophomonas maltophilia NOT DETECTED NOT DETECTED Final   Candida albicans NOT DETECTED NOT DETECTED Final   Candida auris NOT DETECTED NOT DETECTED Final   Candida glabrata NOT DETECTED NOT DETECTED Final   Candida krusei NOT DETECTED NOT DETECTED Final   Candida parapsilosis NOT DETECTED NOT DETECTED Final   Candida tropicalis NOT DETECTED NOT DETECTED Final   Cryptococcus neoformans/gattii NOT DETECTED NOT DETECTED Final   Meth resistant mecA/C and MREJ DETECTED (A) NOT DETECTED Final    Comment: CRITICAL RESULT CALLED TO, READ BACK BY AND VERIFIED WITH: SHEEMA HALLAJI AT 2030 ON 12/03/20 BY SS Performed at [facility], [address], [place], [place] [address]       Imaging Studies   No results found.   Medications   Scheduled Meds:  amLODipine  10 mg Oral Daily   Chlorhexidine Gluconate  Cloth  6 each Topical Q0600   enoxaparin (LOVENOX) injection  40 mg Subcutaneous Q24H   feeding supplement  237 mL Oral TID BM   losartan  50 mg Oral Daily   multivitamin with minerals  1 tablet Oral Daily   mupirocin ointment  1 application Nasal BID   oxyCODONE  5 mg Oral Q6H   polyethylene glycol  17 g Oral Daily   senna-docusate  1 tablet Oral BID   Continuous Infusions:  ceFTAROline (TEFLARO) IV 600 mg (12/04/20 1302)   vancomycin 1,500 mg (12/04/20 0921)       LOS: 4 days    Time spent: 30 minutes with greater than 50% at bedside in coordination of care    Ezekiel Slocumb, DO [facility]  12/04/2020, 3:43 PM      If 7PM-7AM, please contact night-coverage. How to contact the [facility] Attending or Consulting provider [place] or covering provider during after hours [place], for this patient?    Check the care team in [facility] and look for a) attending/consulting [facility] provider listed and b) the [facility] team listed Log into [email or web address] and use [place]'s universal password to access. If you do not have the password, please contact the hospital operator. Locate the [facility] provider you are looking for under [facility] and page to a number that you can be directly reached. If you still have difficulty reaching the provider, please page the [facility] (Director on Call) for the Hospitalists listed on amion for assistance.

## 2020-12-05 DIAGNOSIS — B9562 Methicillin resistant Staphylococcus aureus infection as the cause of diseases classified elsewhere: Secondary | ICD-10-CM | POA: Diagnosis not present

## 2020-12-05 DIAGNOSIS — G061 Intraspinal abscess and granuloma: Secondary | ICD-10-CM | POA: Diagnosis not present

## 2020-12-05 DIAGNOSIS — R7881 Bacteremia: Secondary | ICD-10-CM | POA: Diagnosis not present

## 2020-12-05 DIAGNOSIS — E876 Hypokalemia: Secondary | ICD-10-CM | POA: Diagnosis not present

## 2020-12-05 LAB — BASIC METABOLIC PANEL
Anion gap: 10 (ref 5–15)
BUN: 32 mg/dL — ABNORMAL HIGH (ref 6–20)
CO2: 26 mmol/L (ref 22–32)
Calcium: 7.6 mg/dL — ABNORMAL LOW (ref 8.9–10.3)
Chloride: 101 mmol/L (ref 98–111)
Creatinine, Ser: 0.72 mg/dL (ref 0.61–1.24)
GFR, Estimated: >60 mL/min (ref >60)
Glucose, Bld: 101 mg/dL — ABNORMAL HIGH (ref 70–99)
Potassium: 3.2 mmol/L — ABNORMAL LOW (ref 3.5–5.1)
Sodium: 137 mmol/L (ref 135–145)

## 2020-12-05 LAB — CBC
HCT: 27.6 % — ABNORMAL LOW (ref 39.0–52.0)
Hemoglobin: 9.4 g/dL — ABNORMAL LOW (ref 13.0–17.0)
MCH: 28.6 pg (ref 26.0–34.0)
MCHC: 34.1 g/dL (ref 30.0–36.0)
MCV: 83.9 fL (ref 80.0–100.0)
Platelets: 149 10*3/uL — ABNORMAL LOW (ref 150–400)
RBC: 3.29 MIL/uL — ABNORMAL LOW (ref 4.22–5.81)
RDW: 15.2 % (ref 11.5–15.5)
WBC: 22.9 10*3/uL — ABNORMAL HIGH (ref 4.0–10.5)
nRBC: 0 % (ref 0.0–0.2)

## 2020-12-05 LAB — CULTURE, BLOOD (ROUTINE X 2): Special Requests: ADEQUATE

## 2020-12-05 LAB — MAGNESIUM: Magnesium: 1.8 mg/dL (ref 1.7–2.4)

## 2020-12-05 LAB — VANCOMYCIN, PEAK: Vancomycin Pk: 35 ug/mL (ref 30–40)

## 2020-12-05 LAB — VANCOMYCIN, TROUGH: Vancomycin Tr: 12 ug/mL — ABNORMAL LOW (ref 15–20)

## 2020-12-05 MED ORDER — TAMSULOSIN HCL 0.4 MG PO CAPS
0.4000 mg | ORAL_CAPSULE | Freq: Every day | ORAL | Status: DC
  Administered 2020-12-05 – 2020-12-08 (×4): 0.4 mg via ORAL
  Filled 2020-12-05 (×4): qty 1

## 2020-12-05 MED ORDER — POTASSIUM CHLORIDE CRYS ER 20 MEQ PO TBCR
40.0000 meq | EXTENDED_RELEASE_TABLET | ORAL | Status: AC
  Administered 2020-12-05 (×3): 40 meq via ORAL
  Filled 2020-12-05 (×3): qty 2

## 2020-12-05 NOTE — Progress Notes (Signed)
    Attending Progress Note  History: Caleb Fisher is a 45 y.o male presenting with AMS and sepsis. Blood cultures showed MRSA bacteremia. 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 Pt underwent C2-4 right hemilaminectomies for evacuation of epidural on 12/01/20  POD4: NAEO. +urinary retention.  + constipation.  Had some confusion overnight.  POD3: NAEO. Having some pain.   POD2: NAEO. Pt extubated yesterday. Complains of neck pain and generalized fatigue. Remains on Cardene drip.  POD1: Pt remained intubated overnight for agitation.  Physical Exam: Vitals:   12/05/20 0409 12/05/20 0800  BP: 139/86 (!) 141/87  Pulse: 80   Resp: 16 18  Temp: 98 F (36.7 C) 98.3 F (36.8 C)  SpO2: 97% 98%     CNI OE spontaneously MAEW 5/5 Incision - c/d/i  Data:  Recent Labs  Lab 12/03/20 0755 12/04/20 0428 12/05/20 0440  NA 138 138 137  K 3.4* 3.3* 3.2*  CL 103 104 101  CO2 29 28 26   BUN 33* 38* 32*  CREATININE 0.61 0.62 0.72  GLUCOSE 191* 110* 101*  CALCIUM 7.8* 7.9* 7.6*   Recent Labs  Lab 12/03/20 0755  AST 33  ALT 27  ALKPHOS 111     Recent Labs  Lab 12/02/20 0327 12/04/20 0428 12/05/20 0440  WBC 15.2* 20.8* 22.9*  HGB 11.3* 10.4* 9.4*  HCT 32.6* 30.1* 27.6*  PLT 92* 124* 149*   Recent Labs  Lab 12/01/20 0147 12/01/20 1047 12/02/20 0327  APTT 41* 40* 33  INR 1.3* 1.4* 1.2         Other tests/results:  MRI C-spine 11/30/20 IMPRESSION: 1. 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 2. No spinal cord signal change. 3. Small prevertebral effusion from C2-C5. 4. No acute intracranial abnormality. 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   Electronically Signed: By: Ulyses Jarred M.D. On: 11/30/2020 21:51    Assessment/Plan:  Caleb Fisher is a 45 y.o presenting with MRSA  bacteremia and a cervical epidural abscess status post C2-4 right hemilaminectomies for evacuation of epidural on 12/01/20.  - ABX coverage per ID recommendations - no activity restrictions from a neurosurgical standpoint - q4 hr neuro checks - Continue DVT PPX - Remainder of plan per primary team and ID - Pain control - methocarbamol, toradol, acetaminophen, oxycodone.  Would limit narcotics.  Meade Maw MD Department of Neurosurgery

## 2020-12-05 NOTE — Progress Notes (Signed)
PROGRESS NOTE    Caleb Fisher   JJK:[id number]  DOB: 10/14/1975  PCP: Pcp, No    DOA: 11/30/2020 LOS: 5    Brief Narrative / Hospital Course to Date:   45 year old male with no past medical history other than tobacco abuse, presented to the ED from home on 11/30/2020 with altered mental status, increased back pain and new onset neck pain, and frequent falls.  Evaluation in the ED was consistent with severe sepsis from unclear source, with tachycardia and tachypnea.  Subsequently found to have MRSA bacteremia.  LP was attempted but unable to be completed due to patient agitation and unable to lie still.  Further imaging of the spine revealed a cervical epidural abscess involving C2-C5.  Started on empiric broad-spectrum antibiotics.  Labs also notable for profound hypokalemia, thrombocytopenia.  Admitted to [facility] service with infectious disease, neurosurgery, neurology and PCCM consulted.  Transferred to ICU.  Assessment & Plan   Active Problems:   Encephalopathy acute   Altered mental status   MRSA bacteremia   Abscess in epidural space of cervical spine   Hypertensive urgency   Hypokalemia   Thrombocytopenia (HCC)   Acute metabolic encephalopathy   Severe sepsis (HCC)   Severe sepsis -POA as evidenced by tachycardia, tachypnea, fevers, acute encephalopathy consistent with organ dysfunction and severe sepsis. 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 Septic pulmonary emboli --ID and neurosurgery consulted - see their recs --Abx per ID --On vancomycin and cetaroline (cefepime and Flagyl discontinued) --Neurosurgery took to OR washout of cervical epidural abscess washout and laminectomy 11/16 --Follow repeat cultures, CBC, fever curve --TTE 11/16: EF 70-75%, g1dd, neg for vegetations, consider TEE --PICC line once repeat Bcx's negative  Neurosurgery recs:  "- ABX coverage per ID  recommendations - no activity restrictions from a neurosurgical standpoint - q4 hr neuro checks - Continue DVT PPX - Pain control - methocarbamol, toradol, acetaminophen, oxycodone.  Would limit narcotics."  Acute metabolic encephalopathy - Resolved now waxing and waning.  ?Developing ICU delirium.  Most likely due to infection/sepsis, ?opioid withdrawal. LP on admisison was unable to be obtained after multiple attempts, due to patient agitation and inability to lay still. Briefly on Precedex POD1 s/p extubation in ICU -- Treat infection and other underlying conditions as outlined --Manage pain adequately --Aspiration precautions --Delirium precautions  Thrombocytopenia -Improving.  Likely due to sepsis, DIC was a concern. S/p platelet transfusions early in admission. -- Hematology consulted  Acute kidney injury -Resolved.  Likely due to sepsis, dehydration, possibly NSAID use. Renal function improved with IV fluids. --Monitor BMP  Hypokalemia -refractory.   11/20: K 3.2, down from 3.3 despite total 80 mEq replaced yesterday. K continues to be replaced daily. With hypertension as well, consider hyperaldosteronism. --Replace w PO Kcl 40 mEq x 3 today --Pending urine potassium creatinine --Pending aldo/renin activity ratio --Monitor BMP, replace as needed  Hypertension with hypertensive urgency - Essential Hypertension - not on meds outpatient, does not follow w physicians --Stopped Cardene drip 11/18 --Started losartan 25>>50 mg daily --Amlodipine 10 mg added --Monitor renal function & electrolytes --PRN hydralazine PO or IV labetalol --Monitor BP closely --Urine studies and renin-aldo activity ratio pending given also refractory hypokalemia  Acute Urinary Retention - reported onset overnight 11/19-20, ongoing this AM. --Bladder scans --In/out cath if >450cc retained --Replace Foley if needing recurrent in/out caths --Will start Flomax and monitor for  improvement  Transaminitis - Resolved. POA  with AST elevated, possibly due to shock liver versus alcohol abuse although wife reports patient seldom drinks. Right upper quadrant ultrasound showed gallbladder sludge without cholelithiasis, nonobstructing right renal calculi similar to that seen on prior CT. --Follow LFTs   Patient BMI: Body mass index is 23.9 kg/m.   DVT prophylaxis: enoxaparin (LOVENOX) injection 40 mg Start: 12/02/20 2200 SCD's Start: 12/01/20 1907   Diet:  Diet Orders (From admission, onward)     Start     Ordered   12/02/20 1505  Diet regular Room service appropriate? Yes; Fluid consistency: Thin  Diet effective now       Question Answer Comment  Room service appropriate? Yes   Fluid consistency: Thin      12/02/20 1504              Code Status: Full Code   Subjective 12/05/20    Per awake sitting up in bed.  Having some urinary retention overnight and this AM.  This interfered with sleep.  Pain is moderate, but says overall controlled with medications.  No fever/chills. No other acute complaints.    Disposition Plan & Communication   Status is: Inpatient  Remains inpatient appropriate because: Severity of illness.  Remains on IV therapies with evaluation pending for MRSA bacteremia and cervical spinal abscess.  Evaluation ongoing, repeat cultures pending.   Family Communication: Wife and best friend at bedside during encounter 11/19.   Consults, Procedures, Significant Events   Consultants:  Infectious disease Neurosurgery PCCM Neurology Hematology/oncology  Procedures:  Washout of cervical spinal epidural abscess  Antimicrobials:  Anti-infectives (From admission, onward)    Start     Dose/Rate Route Frequency Ordered Stop   12/03/20 1000  vancomycin (VANCOREADY) IVPB 1000 mg/200 mL  Status:  Discontinued        1,000 mg 200 mL/hr over 60 Minutes Intravenous Every 12 hours 12/03/20 0850 12/03/20 0850   12/03/20 1000  vancomycin  (VANCOREADY) IVPB 1500 mg/300 mL        1,500 mg 150 mL/hr over 120 Minutes Intravenous Every 12 hours 12/03/20 0850     12/01/20 1630  ceftaroline (TEFLARO) 600 mg in sodium chloride 0.9 % 100 mL IVPB        600 mg 100 mL/hr over 60 Minutes Intravenous Every 8 hours 12/01/20 1533     12/01/20 0800  cefTRIAXone (ROCEPHIN) 2 g in sodium chloride 0.9 % 100 mL IVPB  Status:  Discontinued        2 g 200 mL/hr over 30 Minutes Intravenous Every 24 hours 11/30/20 2311 12/01/20 0032   12/01/20 0700  vancomycin (VANCOREADY) IVPB 1000 mg/200 mL  Status:  Discontinued        1,000 mg 200 mL/hr over 60 Minutes Intravenous Every 12 hours 11/30/20 2319 12/03/20 0850   12/01/20 0700  ceFEPIme (MAXIPIME) 2 g in sodium chloride 0.9 % 100 mL IVPB  Status:  Discontinued        2 g 200 mL/hr over 30 Minutes Intravenous Every 8 hours 12/01/20 0054 12/01/20 0128   12/01/20 0618  metroNIDAZOLE (FLAGYL) IVPB 500 mg  Status:  Discontinued        500 mg 100 mL/hr over 60 Minutes Intravenous Every 6 hours 12/01/20 0031 12/01/20 1237   12/01/20 0600  cefTRIAXone (ROCEPHIN) 2 g in sodium chloride 0.9 % 100 mL IVPB  Status:  Discontinued        2 g 200 mL/hr over 30 Minutes Intravenous Every 12 hours 12/01/20 0035 12/01/20 0049  12/01/20 0045  cefTRIAXone (ROCEPHIN) 2 g in sodium chloride 0.9 % 100 mL IVPB  Status:  Discontinued        2 g 200 mL/hr over 30 Minutes Intravenous Every 12 hours 12/01/20 0032 12/01/20 0035   12/01/20 0000  metroNIDAZOLE (FLAGYL) IVPB 500 mg  Status:  Discontinued        500 mg 100 mL/hr over 60 Minutes Intravenous Every 12 hours 11/30/20 2311 12/01/20 0031   11/30/20 2300  ceFEPIme (MAXIPIME) 2 g in sodium chloride 0.9 % 100 mL IVPB  Status:  Discontinued        2 g 200 mL/hr over 30 Minutes Intravenous Every 8 hours 11/30/20 1816 12/01/20 0000   11/30/20 1930  acyclovir (ZOVIRAX) 725 mg in dextrose 5 % 150 mL IVPB  Status:  Discontinued        10 mg/kg  72.6 kg 164.5 mL/hr over 60  Minutes Intravenous Every 8 hours 11/30/20 1915 12/01/20 0032   11/30/20 1500  vancomycin (VANCOREADY) IVPB 1750 mg/350 mL        1,750 mg 175 mL/hr over 120 Minutes Intravenous  Once 11/30/20 1443 11/30/20 2200   11/30/20 1445  ceFEPIme (MAXIPIME) 2 g in sodium chloride 0.9 % 100 mL IVPB        2 g 200 mL/hr over 30 Minutes Intravenous  Once 11/30/20 1436 11/30/20 1728   11/30/20 1445  metroNIDAZOLE (FLAGYL) IVPB 500 mg        500 mg 100 mL/hr over 60 Minutes Intravenous  Once 11/30/20 1436 11/30/20 1835   11/30/20 1445  vancomycin (VANCOCIN) IVPB 1000 mg/200 mL premix  Status:  Discontinued        1,000 mg 200 mL/hr over 60 Minutes Intravenous  Once 11/30/20 1436 11/30/20 1443         Micro    Objective   Vitals:   12/05/20 0000 12/05/20 0409 12/05/20 0800 12/05/20 1200  BP: (!) 137/95 139/86 (!) 141/87 119/72  Pulse:  80  75  Resp: (!) 24 16 18 20   Temp: 98.3 F (36.8 C) 98 F (36.7 C) 98.3 F (36.8 C) 98.3 F (36.8 C)  TempSrc:   Oral Oral  SpO2:  97% 98% 94%  Weight:      Height:        Intake/Output Summary (Last 24 hours) at 12/05/2020 1516 Last data filed at 12/05/2020 1252 Gross per 24 hour  Intake 945.18 ml  Output 1625 ml  Net -679.82 ml   Filed Weights   11/30/20 1311 11/30/20 2216  Weight: 72.6 kg 73.4 kg    Physical Exam:  General exam: awake and alert, sitting up in bed, no acute distress Respiratory system: normal respiratory effort, on room air. Cardiovascular system: regular rate and rhythm, no peripheral edema Central nervous system: A&Ox4, no gross focal deficits, normal speech Extremities: no cyanosis, normal tone, moves all Psychiatry: normal mood, intact judgement and insight  Labs   Data Reviewed: I have personally reviewed following labs and imaging studies  CBC: Recent Labs  Lab 11/30/20 1310 12/01/20 0030 12/01/20 0157 12/01/20 1047 12/02/20 0327 12/04/20 0428 12/05/20 0440  WBC 14.2*   < > 13.0* 15.6* 15.2* 20.8*  22.9*  NEUTROABS 12.8*  --   --  14.0* 13.5*  --   --   HGB 12.8*   < > 11.3* 11.0* 11.3* 10.4* 9.4*  HCT 36.6*   < > 32.6* 31.8* 32.6* 30.1* 27.6*  MCV 85.1   < > 83.8 84.4 84.9  85.8 83.9  PLT 125*   < > 80* 104* 92* 124* 149*   < > = values in this interval not displayed.   Basic Metabolic Panel: Recent Labs  Lab 11/30/20 1310 12/01/20 0157 12/02/20 0327 12/02/20 1309 12/02/20 1912 12/03/20 0755 12/04/20 0428 12/05/20 0440  NA 135   < > 139  --  138 138 138 137  K 3.0*   < > 2.6* 2.9* 3.1* 3.4* 3.3* 3.2*  CL 99   < > 100  --  106 103 104 101  CO2 23   < > 26  --  27 29 28 26   GLUCOSE 117*   < > 171*  --  181* 191* 110* 101*  BUN 54*   < > 36*  --  38* 33* 38* 32*  CREATININE 1.58*   < > 0.76  --  0.69 0.61 0.62 0.72  CALCIUM 8.4*   < > 8.1*  --  8.3* 7.8* 7.9* 7.6*  MG 2.4  --  2.3  --   --   --   --  1.8   < > = values in this interval not displayed.   GFR: Estimated Creatinine Clearance: 115.4 mL/min (by C-G formula based on SCr of 0.72 mg/dL). Liver Function Tests: Recent Labs  Lab 11/30/20 1310 12/01/20 0157 12/02/20 0327 12/03/20 0755  AST 102* 97* 40 33  ALT 55* 49* 32 27  ALKPHOS 166* 129* 107 111  BILITOT 1.1 1.5* 1.4* 1.1  PROT 6.5 5.3* 5.2* 5.1*  ALBUMIN 2.9* 2.2* 1.9* 2.0*   No results for input(s): LIPASE, AMYLASE in the last 168 hours. Recent Labs  Lab 11/30/20 1454  AMMONIA 14   Coagulation Profile: Recent Labs  Lab 11/30/20 1310 12/01/20 0147 12/01/20 1047 12/02/20 0327  INR 1.2 1.3* 1.4* 1.2   Cardiac Enzymes: No results for input(s): CKTOTAL, CKMB, CKMBINDEX, TROPONINI in the last 168 hours. BNP (last 3 results) No results for input(s): PROBNP in the last 8760 hours. HbA1C: No results for input(s): HGBA1C in the last 72 hours.  CBG: Recent Labs  Lab 12/01/20 1131  GLUCAP 97   Lipid Profile: No results for input(s): CHOL, HDL, LDLCALC, TRIG, CHOLHDL, LDLDIRECT in the last 72 hours.  Thyroid Function Tests: No results for  input(s): TSH, T4TOTAL, FREET4, T3FREE, THYROIDAB in the last 72 hours.  Anemia Panel: No results for input(s): VITAMINB12, FOLATE, FERRITIN, TIBC, IRON, RETICCTPCT in the last 72 hours. Sepsis Labs: Recent Labs  Lab 11/30/20 1310 11/30/20 1643 11/30/20 2237 12/01/20 0147 12/01/20 0157  PROCALCITON 17.28  --   --   --  22.89  LATICACIDVEN 2.0* 2.2* 1.7 1.9  --     Recent Results (from the past 240 hour(s))  Blood Culture (routine x 2)     Status: Abnormal   Collection Time: 11/30/20  2:54 PM   Specimen: BLOOD  Result Value Ref Range Status   Specimen Description   Final    BLOOD LEFT ANTECUBITAL Performed at [facility], [address] [place], [place] [address]    Special Requests   Final    BOTTLES DRAWN AEROBIC AND ANAEROBIC Blood Culture results may not be optimal due to an excessive volume of blood received in culture bottles Performed at [facility], [address] [place], [place] [address]    Culture  Setup Time   Final    Organism ID to follow [place] CRITICAL RESULT CALLED TO, READ  BACK BY AND VERIFIED WITH: NATHAN BELUE @0011  ON 12/01/20 SKL    Culture METHICILLIN RESISTANT STAPHYLOCOCCUS AUREUS (A)  Final   Report Status 12/03/2020 FINAL  Final   Organism ID, Bacteria METHICILLIN RESISTANT STAPHYLOCOCCUS AUREUS  Final      Susceptibility   Methicillin resistant staphylococcus aureus - MIC*    CIPROFLOXACIN >=8 RESISTANT Resistant     ERYTHROMYCIN >=8 RESISTANT Resistant     GENTAMICIN <=0.5 SENSITIVE Sensitive     OXACILLIN >=4 RESISTANT Resistant     TETRACYCLINE <=1 SENSITIVE Sensitive     VANCOMYCIN 1 SENSITIVE Sensitive     TRIMETH/SULFA <=10 SENSITIVE Sensitive     CLINDAMYCIN <=0.25 SENSITIVE Sensitive     RIFAMPIN <=0.5 SENSITIVE Sensitive     Inducible Clindamycin NEGATIVE Sensitive     * METHICILLIN RESISTANT STAPHYLOCOCCUS AUREUS  Blood Culture (routine x 2)     Status:  Abnormal   Collection Time: 11/30/20  2:54 PM   Specimen: BLOOD  Result Value Ref Range Status   Specimen Description   Final    BLOOD BLOOD RIGHT HAND Performed at [facility], [address] [place], [place] [address]    Special Requests   Final    BOTTLES DRAWN AEROBIC AND ANAEROBIC Blood Culture adequate volume Performed at [facility], [address] [place], [place] [address]    Culture  Setup Time   Final    GRAM POSITIVE COCCI IN BOTH AEROBIC AND ANAEROBIC BOTTLES CRITICAL VALUE NOTED.  VALUE IS CONSISTENT WITH PREVIOUSLY REPORTED AND CALLED VALUE.    Culture (A)  Final    STAPHYLOCOCCUS AUREUS SUSCEPTIBILITIES PERFORMED ON PREVIOUS CULTURE WITHIN THE LAST 5 DAYS. Performed at [facility], [place] [address], [place], [place] [address]    Report Status 12/03/2020 FINAL  Final  Resp Panel by RT-PCR (Flu A&B, Covid) Nasopharyngeal Swab     Status: None   Collection Time: 11/30/20  2:54 PM   Specimen: Nasopharyngeal Swab; Nasopharyngeal(NP) swabs in vial transport medium  Result Value Ref Range Status   SARS Coronavirus 2 by RT PCR NEGATIVE NEGATIVE Final    Comment: (NOTE) SARS-CoV-2 target nucleic acids are NOT DETECTED.  The SARS-CoV-2 RNA is generally detectable in upper respiratory specimens during the acute phase of infection. The lowest concentration of SARS-CoV-2 viral copies this assay can detect is 138 copies/mL. A negative result does not preclude SARS-Cov-2 infection and should not be used as the sole basis for treatment or other patient management decisions. A negative result may occur with  improper specimen collection/handling, submission of specimen other than nasopharyngeal swab, presence of viral mutation(s) within the areas targeted by this assay, and inadequate number of viral copies(<138 copies/mL). A negative result must be combined with clinical observations, patient history, and epidemiological information. The expected  result is Negative.  Fact Sheet for Patients:  EntrepreneurPulse.com.au  Fact Sheet for Healthcare Providers:  IncredibleEmployment.be  This test is no t yet approved or cleared by the [place] FDA and  has been authorized for detection and/or diagnosis of SARS-CoV-2 by FDA under an Emergency Use Authorization (EUA). This EUA will remain  in effect (meaning this test can be used) for the duration of the COVID-19 declaration under Section 564(b)(1) of the Act, 21 U.S.C.section 360bbb-3(b)(1), unless the authorization is terminated  or revoked sooner.       Influenza A by PCR NEGATIVE NEGATIVE Final   Influenza B by PCR NEGATIVE NEGATIVE Final    Comment: (NOTE) The  Xpert Xpress SARS-CoV-2/FLU/RSV plus assay is intended as an aid in the diagnosis of influenza from Nasopharyngeal swab specimens and should not be used as a sole basis for treatment. Nasal washings and aspirates are unacceptable for Xpert Xpress SARS-CoV-2/FLU/RSV testing.  Fact Sheet for Patients: EntrepreneurPulse.com.au  Fact Sheet for Healthcare Providers: IncredibleEmployment.be  This test is not yet approved or cleared by the [place] FDA and has been authorized for detection and/or diagnosis of SARS-CoV-2 by FDA under an Emergency Use Authorization (EUA). This EUA will remain in effect (meaning this test can be used) for the duration of the COVID-19 declaration under Section 564(b)(1) of the Act, 21 U.S.C. section 360bbb-3(b)(1), unless the authorization is terminated or revoked.  Performed at [facility], [address] [place], [place] [address]   Blood Culture ID Panel (Reflexed)     Status: Abnormal   Collection Time: 11/30/20  2:54 PM  Result Value Ref Range Status   Enterococcus faecalis NOT DETECTED NOT DETECTED Final   Enterococcus Faecium NOT DETECTED NOT DETECTED Final   Listeria monocytogenes NOT  DETECTED NOT DETECTED Final   Staphylococcus species DETECTED (A) NOT DETECTED Final    Comment: CRITICAL RESULT CALLED TO, READ BACK BY AND VERIFIED WITH: NATHAN BELUE @0011  ON 12/01/20 SKL    Staphylococcus aureus (BCID) DETECTED (A) NOT DETECTED Final    Comment: Methicillin (oxacillin)-resistant Staphylococcus aureus (MRSA). MRSA is predictably resistant to beta-lactam antibiotics (except ceftaroline). Preferred therapy is vancomycin unless clinically contraindicated. Patient requires contact precautions if  hospitalized. CRITICAL RESULT CALLED TO, READ BACK BY AND VERIFIED WITH: NATHAN BELUE @0011  ON 12/01/20 SKL    Staphylococcus epidermidis NOT DETECTED NOT DETECTED Final   Staphylococcus lugdunensis NOT DETECTED NOT DETECTED Final   Streptococcus species NOT DETECTED NOT DETECTED Final   Streptococcus agalactiae NOT DETECTED NOT DETECTED Final   Streptococcus pneumoniae NOT DETECTED NOT DETECTED Final   Streptococcus pyogenes NOT DETECTED NOT DETECTED Final   A.calcoaceticus-baumannii NOT DETECTED NOT DETECTED Final   Bacteroides fragilis NOT DETECTED NOT DETECTED Final   Enterobacterales NOT DETECTED NOT DETECTED Final   Enterobacter cloacae complex NOT DETECTED NOT DETECTED Final   Escherichia coli NOT DETECTED NOT DETECTED Final   Klebsiella aerogenes NOT DETECTED NOT DETECTED Final   Klebsiella oxytoca NOT DETECTED NOT DETECTED Final   Klebsiella pneumoniae NOT DETECTED NOT DETECTED Final   Proteus species NOT DETECTED NOT DETECTED Final   Salmonella species NOT DETECTED NOT DETECTED Final   Serratia marcescens NOT DETECTED NOT DETECTED Final   Haemophilus influenzae NOT DETECTED NOT DETECTED Final   Neisseria meningitidis NOT DETECTED NOT DETECTED Final   Pseudomonas aeruginosa NOT DETECTED NOT DETECTED Final   Stenotrophomonas maltophilia NOT DETECTED NOT DETECTED Final   Candida albicans NOT DETECTED NOT DETECTED Final   Candida auris NOT DETECTED NOT DETECTED Final    Candida glabrata NOT DETECTED NOT DETECTED Final   Candida krusei NOT DETECTED NOT DETECTED Final   Candida parapsilosis NOT DETECTED NOT DETECTED Final   Candida tropicalis NOT DETECTED NOT DETECTED Final   Cryptococcus neoformans/gattii NOT DETECTED NOT DETECTED Final   Meth resistant mecA/C and MREJ DETECTED (A) NOT DETECTED Final    Comment: CRITICAL RESULT CALLED TO, READ BACK BY AND VERIFIED WITH: NATHAN BELUE @0011  ON 12/01/20 SKL Performed at [facility], [address], [place], [place] [address]   Urine Culture     Status: None   Collection Time: 12/01/20  6:18 AM   Specimen: Urine, Clean Catch  Result Value Ref Range Status   Specimen Description   Final    URINE, CLEAN CATCH Performed at [facility], [address] [place], [place] [address]    Special Requests   Final    NONE Performed at [facility], [address], [place], [place] [address]    Culture   Final    NO GROWTH Performed at [facility], [place] [address] [place], [place] [address]    Report Status 12/02/2020 FINAL  Final  MRSA Next Gen by PCR, Nasal     Status: Abnormal   Collection Time: 12/01/20  8:40 AM   Specimen: Nasal Mucosa; Nasal Swab  Result Value Ref Range Status   MRSA by PCR Next Gen DETECTED (A) NOT DETECTED Final    Comment: RESULT CALLED TO, READ BACK BY AND VERIFIED WITH: CHARLIE FLEETWOOD 12/01/2020 1447 JGF Performed at [facility], [place], [place] [address]   Aerobic Culture w Gram Stain (superficial specimen)     Status: None   Collection Time: 12/01/20 12:44 PM   Specimen: Wound  Result Value Ref Range Status   Specimen Description   Final    WOUND Performed at [facility], [address], [place], [place] [address]    Special Requests   Final    NONE Performed at [facility], [address] [place], [place] [address]    Gram Stain   Final    RARE WBC PRESENT, PREDOMINANTLY  MONONUCLEAR RARE GRAM POSITIVE COCCI Performed at [facility], [place] [address], [place], [place] [address]    Culture FEW METHICILLIN RESISTANT STAPHYLOCOCCUS AUREUS  Final   Report Status 12/04/2020 FINAL  Final   Organism ID, Bacteria METHICILLIN RESISTANT STAPHYLOCOCCUS AUREUS  Final      Susceptibility   Methicillin resistant staphylococcus aureus - MIC*    CIPROFLOXACIN >=8 RESISTANT Resistant     ERYTHROMYCIN >=8 RESISTANT Resistant     GENTAMICIN <=0.5 SENSITIVE Sensitive     OXACILLIN >=4 RESISTANT Resistant     TETRACYCLINE <=1 SENSITIVE Sensitive     VANCOMYCIN <=0.5 SENSITIVE Sensitive     TRIMETH/SULFA <=10 SENSITIVE Sensitive     CLINDAMYCIN <=0.25 SENSITIVE Sensitive     RIFAMPIN <=0.5 SENSITIVE Sensitive     Inducible Clindamycin NEGATIVE Sensitive     * FEW METHICILLIN RESISTANT STAPHYLOCOCCUS AUREUS  Aerobic/Anaerobic Culture w Gram Stain (surgical/deep wound)     Status: None (Preliminary result)   Collection Time: 12/01/20  5:55 PM   Specimen: PATH Cytology Misc. fluid; Body Fluid  Result Value Ref Range Status   Specimen Description   Final    WOUND EPIDURALABSCESS Performed at [facility], [address] [place], [place] [address]    Special Requests   Final    NONE Performed at [facility], [place], [place] [address]    Gram Stain   Final    NO SQUAMOUS EPITHELIAL CELLS SEEN FEW WBC SEEN FEW GRAM POSITIVE COCCI Performed at [facility], [place] [address], [place],  [address]    Culture   Final    FEW METHICILLIN RESISTANT STAPHYLOCOCCUS AUREUS NO ANAEROBES ISOLATED; CULTURE IN PROGRESS FOR 5 DAYS    Report Status PENDING  Incomplete   Organism ID, Bacteria METHICILLIN RESISTANT STAPHYLOCOCCUS AUREUS  Final      Susceptibility   Methicillin resistant staphylococcus aureus - MIC*    CIPROFLOXACIN >=8 RESISTANT Resistant  ERYTHROMYCIN >=8 RESISTANT Resistant     GENTAMICIN <=0.5 SENSITIVE  Sensitive     OXACILLIN >=4 RESISTANT Resistant     TETRACYCLINE <=1 SENSITIVE Sensitive     VANCOMYCIN 1 SENSITIVE Sensitive     TRIMETH/SULFA <=10 SENSITIVE Sensitive     CLINDAMYCIN <=0.25 SENSITIVE Sensitive     RIFAMPIN <=0.5 SENSITIVE Sensitive     Inducible Clindamycin NEGATIVE Sensitive     * FEW METHICILLIN RESISTANT STAPHYLOCOCCUS AUREUS  CULTURE, BLOOD (ROUTINE X 2) w Reflex to ID Panel     Status: Abnormal (Preliminary result)   Collection Time: 12/02/20  7:12 PM   Specimen: BLOOD  Result Value Ref Range Status   Specimen Description   Final    BLOOD LEFT ANTECUBITAL Performed at [facility], [address], [place], [place] [address]    Special Requests   Final    BOTTLES DRAWN AEROBIC AND ANAEROBIC Blood Culture adequate volume Performed at [facility], [address] [place], [place] [address]    Culture  Setup Time   Final    GRAM POSITIVE COCCI ANAEROBIC BOTTLE ONLY CRITICAL VALUE NOTED.  VALUE IS CONSISTENT WITH PREVIOUSLY REPORTED AND CALLED VALUE. Performed at [facility], [place] [place], [place] [id number]    Culture (A)  Final    STAPHYLOCOCCUS AUREUS SUSCEPTIBILITIES PERFORMED ON PREVIOUS CULTURE WITHIN THE LAST 5 DAYS. Performed at [facility], [place] [address], [place], [place] [address]    Report Status PENDING  Incomplete  CULTURE, BLOOD (ROUTINE X 2) w Reflex to ID Panel     Status: Abnormal   Collection Time: 12/02/20  7:13 PM   Specimen: BLOOD  Result Value Ref Range Status   Specimen Description   Final    BLOOD BLOOD LEFT WRIST Performed at [facility], [address] [place], [place] [address]    Special Requests   Final    BOTTLES DRAWN AEROBIC AND ANAEROBIC Blood Culture adequate volume Performed at [facility], [address] [place], [place] [address]    Culture  Setup Time   Final    GRAM POSITIVE COCCI ANAEROBIC BOTTLE ONLY CRITICAL RESULT CALLED TO, READ BACK  BY AND VERIFIED WITH: SHEEMA HALLAJI AT 2030 ON 12/03/20 BY SS    Culture (A)  Final    STAPHYLOCOCCUS AUREUS SUSCEPTIBILITIES PERFORMED ON PREVIOUS CULTURE WITHIN THE LAST 5 DAYS. Performed at [facility], [place] [address], [place], [place] [address]    Report Status 12/05/2020 FINAL  Final  Blood Culture ID Panel (Reflexed)     Status: Abnormal   Collection Time: 12/02/20  7:13 PM  Result Value Ref Range Status   Enterococcus faecalis NOT DETECTED NOT DETECTED Final   Enterococcus Faecium NOT DETECTED NOT DETECTED Final   Listeria monocytogenes NOT DETECTED NOT DETECTED Final   Staphylococcus species DETECTED (A) NOT DETECTED Final    Comment: CRITICAL RESULT CALLED TO, READ BACK BY AND VERIFIED WITH: SHEEMA HALLAJI AT 2030 ON 12/03/20 BY SS    Staphylococcus aureus (BCID) DETECTED (A) NOT DETECTED Final    Comment: Methicillin (oxacillin)-resistant Staphylococcus aureus (MRSA). MRSA is predictably resistant to beta-lactam antibiotics (except ceftaroline). Preferred therapy is vancomycin unless clinically contraindicated. Patient requires contact precautions if  hospitalized. CRITICAL RESULT CALLED TO, READ BACK BY AND VERIFIED WITH: SHEEMA HALLAJI AT 2030 ON 12/03/20 BY SS    Staphylococcus epidermidis NOT DETECTED NOT DETECTED Final   Staphylococcus lugdunensis NOT DETECTED NOT DETECTED Final  Streptococcus species NOT DETECTED NOT DETECTED Final   Streptococcus agalactiae NOT DETECTED NOT DETECTED Final   Streptococcus pneumoniae NOT DETECTED NOT DETECTED Final   Streptococcus pyogenes NOT DETECTED NOT DETECTED Final   A.calcoaceticus-baumannii NOT DETECTED NOT DETECTED Final   Bacteroides fragilis NOT DETECTED NOT DETECTED Final   Enterobacterales NOT DETECTED NOT DETECTED Final   Enterobacter cloacae complex NOT DETECTED NOT DETECTED Final   Escherichia coli NOT DETECTED NOT DETECTED Final   Klebsiella aerogenes NOT DETECTED NOT DETECTED Final   Klebsiella oxytoca  NOT DETECTED NOT DETECTED Final   Klebsiella pneumoniae NOT DETECTED NOT DETECTED Final   Proteus species NOT DETECTED NOT DETECTED Final   Salmonella species NOT DETECTED NOT DETECTED Final   Serratia marcescens NOT DETECTED NOT DETECTED Final   Haemophilus influenzae NOT DETECTED NOT DETECTED Final   Neisseria meningitidis NOT DETECTED NOT DETECTED Final   Pseudomonas aeruginosa NOT DETECTED NOT DETECTED Final   Stenotrophomonas maltophilia NOT DETECTED NOT DETECTED Final   Candida albicans NOT DETECTED NOT DETECTED Final   Candida auris NOT DETECTED NOT DETECTED Final   Candida glabrata NOT DETECTED NOT DETECTED Final   Candida krusei NOT DETECTED NOT DETECTED Final   Candida parapsilosis NOT DETECTED NOT DETECTED Final   Candida tropicalis NOT DETECTED NOT DETECTED Final   Cryptococcus neoformans/gattii NOT DETECTED NOT DETECTED Final   Meth resistant mecA/C and MREJ DETECTED (A) NOT DETECTED Final    Comment: CRITICAL RESULT CALLED TO, READ BACK BY AND VERIFIED WITH: SHEEMA HALLAJI AT 2030 ON 12/03/20 BY SS Performed at [facility], [address] [place], [place] [address]   CULTURE, BLOOD (ROUTINE X 2) w Reflex to ID Panel     Status: None (Preliminary result)   Collection Time: 12/04/20  4:28 AM   Specimen: BLOOD  Result Value Ref Range Status   Specimen Description BLOOD LAC  Final   Special Requests   Final    BOTTLES DRAWN AEROBIC AND ANAEROBIC Blood Culture adequate volume   Culture   Final    NO GROWTH 1 DAY Performed at [facility], [address] [place], [place] [address]    Report Status PENDING  Incomplete  CULTURE, BLOOD (ROUTINE X 2) w Reflex to ID Panel     Status: None (Preliminary result)   Collection Time: 12/04/20  4:37 AM   Specimen: BLOOD  Result Value Ref Range Status   Specimen Description BLOOD LHAND  Final   Special Requests   Final    BOTTLES DRAWN AEROBIC AND ANAEROBIC Blood Culture adequate volume   Culture   Final     NO GROWTH 1 DAY Performed at [facility], [address] [place], [place] [address]    Report Status PENDING  Incomplete      Imaging Studies   No results found.   Medications   Scheduled Meds:  amLODipine  10 mg Oral Daily   Chlorhexidine Gluconate Cloth  6 each Topical Q0600   enoxaparin (LOVENOX) injection  40 mg Subcutaneous Q24H   feeding supplement  237 mL Oral TID BM   losartan  50 mg Oral Daily   multivitamin with minerals  1 tablet Oral Daily   mupirocin ointment  1 application Nasal BID   oxyCODONE  5 mg Oral Q6H   polyethylene glycol  17 g Oral Daily   potassium chloride  40 mEq Oral Q4H   senna-docusate  1 tablet Oral BID   tamsulosin  0.4 mg Oral QPC breakfast  Continuous Infusions:  ceFTAROline (TEFLARO) IV 600 mg (12/05/20 1508)   vancomycin 150 mL/hr at 12/05/20 1100       LOS: 5 days    Time spent: 25 minutes with greater than 50% at bedside in coordination of care    Ezekiel Slocumb, DO [facility]  12/05/2020, 3:16 PM      If 7PM-7AM, please contact night-coverage. How to contact the [facility] Attending or Consulting provider [place] or covering provider during after hours [place], for this patient?    Check the care team in [facility] and look for a) attending/consulting [facility] provider listed and b) the [facility] team listed Log into [email or web address] and use [place]'s universal password to access. If you do not have the password, please contact the hospital operator. Locate the [facility] provider you are looking for under [facility] and page to a number that you can be directly reached. If you still have difficulty reaching the provider, please page the [facility] (Director on Call) for the Hospitalists listed on amion for assistance.

## 2020-12-05 NOTE — Progress Notes (Signed)
Pharmacy Antibiotic Note  Caleb Fisher is a 45 y.o. 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  Pharmacy has been consulted for Vancomycin dosing.  Patient presented with severe neck pain and confusion. No apparent PMH other than chronic back pain. MRI visualized cervical abscess of neck and spine at C2-C5. CT with bilateral pulmonary nodules, likely septic emboli.   Added antistaphylococcal beta lactam, ceftaroline, while awaiting MIC of vancomycin d/t concern for high bioburden. S/p C2-4 right hemilaminectomies for evacuation of epidural on 12/01/20. ECHO negative for vegetations.  Renal function improved Scr < 0.8 and at BL. Has remained afebrile.  Vancomycin dose given 11/19 @2200  11/20 Vanc peak 35 (@0109  1 hour post-dose) 11/20 Vanc trough 12 (@0938  prior to next dose)  Plan: Continue vancomycin 1500 mg every 12 hours Goal AUC 400-600 Calculated AUC 584.9 Cmin 11.5 Next vancomycin level in 1 week from 11/20  Also on ceftaroline 600 mg every 8 hours as above.  Monitor culture results, renal function, LOT.  Expecting IV antibiotic duration of 6 weeks.  Height: 5\' 9"  (175.3 cm) Weight: 73.4 kg (161 lb 13.1 oz) IBW/kg (Calculated) : 70.7  Temp (24hrs), Avg:98.3 F (36.8 C), Min:98 F (36.7 C), Max:98.5 F (36.9 C)  Recent Labs  Lab 11/30/20 1310 11/30/20 1643 11/30/20 2237 12/01/20 0030 12/01/20 0147 12/01/20 0157 12/01/20 1047 12/02/20 0327 12/02/20 1912 12/02/20 2245 12/03/20 0755 12/04/20 0428 12/05/20 0109 12/05/20 0440 12/05/20 0938  WBC 14.2*  --   --    < >  --  13.0* 15.6* 15.2*  --   --   --  20.8*  --  22.9*  --   CREATININE 1.58*  --   --   --   --  1.15 1.01 0.76 0.69  --  0.61 0.62  --  0.72  --   LATICACIDVEN 2.0* 2.2* 1.7  --  1.9  --   --   --   --   --   --   --   --   --   --   VANCOTROUGH  --   --   --   --   --   --   --   --   --   --  8*  --   --   --  12*  VANCOPEAK  --   --   --   --   --   --   --    --   --  19*  --   --  35  --   --    < > = values in this interval not displayed.     Estimated Creatinine Clearance: 115.4 mL/min (by C-G formula based on SCr of 0.72 mg/dL).    No Known Allergies  Antimicrobials this admission: 11/15 Cefepime >> x 2 doses 11/15 Flagyl >>  11/15 Vancomycin >>  Microbiology results: 11/15 BCx: 4 of 4 bottle w/ MRSA 11/15 CSFCx: Pending  11/16 Epidural absces: few GPCs 11/16 wound: few GPCs 11/17 Bcx: NG < 12 hours   Thank you for allowing pharmacy to be a part of this patient's care.    Wynelle Cleveland, PharmD Pharmacy Resident  12/05/2020 10:47 AM

## 2020-12-05 NOTE — Plan of Care (Signed)
Neuro: alert and oriented throughout the day though sleeping off and on, getting up to BSC/use urinal Resp: stable on room air, encouraged to cough/deep breathe CV: afebrile, vital signs stable, no edema GIGU:difficulty urinating throughout the day, Flowmax ordered-some success, Bladder scanned x 2-AM 441 PM 530(night shift to straight cath, he requested to wait for wife to return, Tolerating PO well, no BM, struggling to pass, feels like he needs to, attempted [facility], under sink toilet use, states "I feel like my butthole is going to burst" expresses fear toward a "mess" when a bowel movement occurs-on bowel regimen Skin: clean and intact, surgical site-healing Social: Wife at the bedside throughout the day, great support system for patient, all questions and concerns addressed  Problem: Education: Goal: Knowledge of General Education information will improve Description: Including pain rating scale, medication(s)/side effects and non-pharmacologic comfort measures Outcome: Progressing   Problem: Health Behavior/Discharge Planning: Goal: Ability to manage health-related needs will improve Outcome: Progressing   Problem: Clinical Measurements: Goal: Ability to maintain clinical measurements within normal limits will improve Outcome: Progressing Goal: Will remain free from infection Outcome: Progressing Goal: Diagnostic test results will improve Outcome: Progressing Goal: Respiratory complications will improve Outcome: Progressing Goal: Cardiovascular complication will be avoided Outcome: Progressing   Problem: Activity: Goal: Risk for activity intolerance will decrease Outcome: Progressing   Problem: Nutrition: Goal: Adequate nutrition will be maintained Outcome: Progressing   Problem: Coping: Goal: Level of anxiety will decrease Outcome: Progressing   Problem: Elimination: Goal: Will not experience complications related to bowel motility Outcome: Progressing Goal: Will not  experience complications related to urinary retention Outcome: Progressing   Problem: Pain Managment: Goal: General experience of comfort will improve Outcome: Progressing   Problem: Safety: Goal: Ability to remain free from injury will improve Outcome: Progressing   Problem: Skin Integrity: Goal: Risk for impaired skin integrity will decrease Outcome: Progressing

## 2020-12-06 DIAGNOSIS — R7881 Bacteremia: Secondary | ICD-10-CM | POA: Diagnosis not present

## 2020-12-06 DIAGNOSIS — I160 Hypertensive urgency: Secondary | ICD-10-CM | POA: Diagnosis not present

## 2020-12-06 DIAGNOSIS — E876 Hypokalemia: Secondary | ICD-10-CM | POA: Diagnosis not present

## 2020-12-06 DIAGNOSIS — G061 Intraspinal abscess and granuloma: Secondary | ICD-10-CM | POA: Diagnosis not present

## 2020-12-06 LAB — CBC WITH DIFFERENTIAL/PLATELET
Abs Immature Granulocytes: 0.36 10*3/uL — ABNORMAL HIGH (ref 0.00–0.07)
Basophils Absolute: 0 10*3/uL (ref 0.0–0.1)
Basophils Relative: 0 %
Eosinophils Absolute: 0 10*3/uL (ref 0.0–0.5)
Eosinophils Relative: 0 %
HCT: 26 % — ABNORMAL LOW (ref 39.0–52.0)
Hemoglobin: 9 g/dL — ABNORMAL LOW (ref 13.0–17.0)
Immature Granulocytes: 2 %
Lymphocytes Relative: 12 %
Lymphs Abs: 2 10*3/uL (ref 0.7–4.0)
MCH: 29.2 pg (ref 26.0–34.0)
MCHC: 34.6 g/dL (ref 30.0–36.0)
MCV: 84.4 fL (ref 80.0–100.0)
Monocytes Absolute: 1.3 10*3/uL — ABNORMAL HIGH (ref 0.1–1.0)
Monocytes Relative: 8 %
Neutro Abs: 14 10*3/uL — ABNORMAL HIGH (ref 1.7–7.7)
Neutrophils Relative %: 78 %
Platelets: 199 10*3/uL (ref 150–400)
RBC: 3.08 MIL/uL — ABNORMAL LOW (ref 4.22–5.81)
RDW: 15.6 % — ABNORMAL HIGH (ref 11.5–15.5)
WBC: 17.8 10*3/uL — ABNORMAL HIGH (ref 4.0–10.5)
nRBC: 0 % (ref 0.0–0.2)

## 2020-12-06 LAB — CULTURE, BLOOD (ROUTINE X 2): Special Requests: ADEQUATE

## 2020-12-06 LAB — BASIC METABOLIC PANEL
Anion gap: 5 (ref 5–15)
BUN: 33 mg/dL — ABNORMAL HIGH (ref 6–20)
CO2: 25 mmol/L (ref 22–32)
Calcium: 7.4 mg/dL — ABNORMAL LOW (ref 8.9–10.3)
Chloride: 104 mmol/L (ref 98–111)
Creatinine, Ser: 0.6 mg/dL — ABNORMAL LOW (ref 0.61–1.24)
GFR, Estimated: >60 mL/min (ref >60)
Glucose, Bld: 102 mg/dL — ABNORMAL HIGH (ref 70–99)
Potassium: 3.7 mmol/L (ref 3.5–5.1)
Sodium: 134 mmol/L — ABNORMAL LOW (ref 135–145)

## 2020-12-06 LAB — PROCALCITONIN: Procalcitonin: 2.23 ng/mL

## 2020-12-06 MED ORDER — BISACODYL 5 MG PO TBEC
5.0000 mg | DELAYED_RELEASE_TABLET | Freq: Every day | ORAL | Status: DC | PRN
  Administered 2020-12-12: 09:00:00 5 mg via ORAL
  Filled 2020-12-06: qty 1

## 2020-12-06 NOTE — Progress Notes (Signed)
Occupational Therapy Treatment Patient Details Name: Caleb Fisher MRN: [id number] DOB: 04/10/1975 Today's Date: 12/06/2020   History of present illness 45-year-old male with no past medical history other than tobacco abuse, presented to the ED from home on 11/30/2020 with altered mental status, increased back pain and new onset neck pain, and frequent falls. MRI of the spine shows epidural cervical abscess around C2-C5.  Pt underwent C2-4 right hemilaminectomies for evacuation of epidural on 12/01/20.   OT comments  Pt seen for OT tx this date. OT engages pt in sup to sit transition with CGA with HOB elevated. Pt demos G static sitting balance. EOB. OT engages pt in gentle and minimal cervical ROM in all available planes as tolerable and minful of post-operative status while gentle massaging scalenes to reduce tension as pt c/o tightness/stiffness in his neck. Pt tolerates well and endorses a reduction in pain from 6 to 5/10. OT also engages pt in UE AROM to 1/2 ROM to improve tolerance for elevating UE against gravity (x5 per side). Pt returned to bed with MIN A. His significant other is present at end of session and OT updates RN. Pt left with all needs met and in reach. VS stable throughout. Will continue to follow acutely.    Recommendations for follow up therapy are one component of a multi-disciplinary discharge planning process, led by the attending physician.  Recommendations may be updated based on patient status, additional functional criteria and insurance authorization.    Follow Up Recommendations  No OT follow up    Assistance Recommended at Discharge Intermittent Supervision/Assistance  Equipment Recommendations  BSC/3in1;Other (comment);Tub/shower seat (2ww)    Recommendations for Other Services      Precautions / Restrictions Precautions Precautions: Fall Restrictions Weight Bearing Restrictions: No Other Position/Activity Restrictions: s/p C2-4 right hemilaminectomies -  per MD notes no activity restrictions       Mobility Bed Mobility Overal bed mobility: Needs Assistance Bed Mobility: Supine to Sit;Sit to Supine     Supine to sit: Min guard Sit to supine: Min assist   General bed mobility comments: somewhat more pain limited, requires MIN A back to bed    Transfers                         Balance Overall balance assessment: Needs assistance Sitting-balance support: Feet supported;No upper extremity supported Sitting balance-Leahy Scale: Fair Sitting balance - Comments: UE support                                   ADL either performed or assessed with clinical judgement   ADL                                              Extremity/Trunk Assessment              Vision       Perception     Praxis      Cognition Arousal/Alertness: Awake/alert Behavior During Therapy: WFL for tasks assessed/performed Overall Cognitive Status: Within Functional Limits for tasks assessed                                 General Comments: A&Ox4, appropriately answers PLOF questions   however with increased processing time. somewhat drowsy          Exercises Other Exercises Other Exercises: OT engages pt in gentle and minimal cervical ROM and scalene massage while seated EOB to reduce pain, 1/2 ROM shld flexion against gravity x5 per side, tactile cues.   Shoulder Instructions       General Comments      Pertinent Vitals/ Pain       Pain Assessment: Faces Faces Pain Scale: Hurts even more Pain Location: global spine, especially cervical Pain Descriptors / Indicators: Pressure;Aching;Sharp Pain Intervention(s): Limited activity within patient's tolerance;Monitored during session;Repositioned  Home Living                                          Prior Functioning/Environment              Frequency  Min 2X/week        Progress Toward Goals  OT  Goals(current goals can now be found in the care plan section)  Progress towards OT goals: Progressing toward goals  Acute Rehab OT Goals Patient Stated Goal: to go home OT Goal Formulation: With patient/family Time For Goal Achievement: 12/17/20 Potential to Achieve Goals: Good  Plan Discharge plan remains appropriate    Co-evaluation                 AM-PAC OT "6 Clicks" Daily Activity     Outcome Measure   Help from another person eating meals?: None Help from another person taking care of personal grooming?: A Little Help from another person toileting, which includes using toliet, bedpan, or urinal?: A Little Help from another person bathing (including washing, rinsing, drying)?: A Little Help from another person to put on and taking off regular upper body clothing?: None Help from another person to put on and taking off regular lower body clothing?: A Little 6 Click Score: 20    End of Session    OT Visit Diagnosis: Unsteadiness on feet (R26.81);Other abnormalities of gait and mobility (R26.89)   Activity Tolerance Patient tolerated treatment well   Patient Left in bed;with call bell/phone within reach;with family/visitor present;with nursing/sitter in room   Nurse Communication Mobility status        Time: 6811-5726 OT Time Calculation (min): 34 min  Charges: OT General Charges $OT Visit: 1 Visit OT Treatments $Therapeutic Activity: 8-22 mins $Massage: 8-22 mins  Gerrianne Scale, [place], OTR/L ascom [phone] 12/06/20, 5:32 PM

## 2020-12-06 NOTE — Progress Notes (Signed)
PROGRESS NOTE    Caleb Fisher   TXM:[id number]  DOB: 05/27/1975  PCP: Pcp, No    DOA: 11/30/2020 LOS: 6    Brief Narrative / Hospital Course to Date:   45 year old male with no past medical history other than tobacco abuse, presented to the ED from home on 11/30/2020 with altered mental status, increased back pain and new onset neck pain, and frequent falls.  Evaluation in the ED was consistent with severe sepsis from unclear source, with tachycardia and tachypnea.  Subsequently found to have MRSA bacteremia.  LP was attempted but unable to be completed due to patient agitation and unable to lie still.  Further imaging of the spine revealed a cervical epidural abscess involving C2-C5.  Started on empiric broad-spectrum antibiotics.  Labs also notable for profound hypokalemia, thrombocytopenia.  Admitted to [facility] service with infectious disease, neurosurgery, neurology and PCCM consulted.  Transferred to ICU.  Assessment & Plan   Active Problems:   Encephalopathy acute   Altered mental status   MRSA bacteremia   Abscess in epidural space of cervical spine   Hypertensive urgency   Hypokalemia   Thrombocytopenia (HCC)   Acute metabolic encephalopathy   Severe sepsis (HCC)   Severe sepsis -POA as evidenced by tachycardia, tachypnea, fevers, acute encephalopathy consistent with organ dysfunction and severe sepsis. 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 Septic pulmonary emboli --ID and neurosurgery consulted - see their recs --Abx per ID --On vancomycin and cetaroline (cefepime and Flagyl discontinued) --Neurosurgery took to OR washout of cervical epidural abscess washout and laminectomy 11/16 --Follow repeat cultures, CBC, fever curve --TTE 11/16: EF 70-75%, g1dd, neg for vegetations, consider TEE --PICC line once repeat Bcx's negative  Neurosurgery recs:  "- ABX coverage per ID  recommendations - no activity restrictions from a neurosurgical standpoint - q4 hr neuro checks - Continue DVT PPX - Pain control - methocarbamol, toradol, acetaminophen, oxycodone.  Would limit narcotics."  Acute metabolic encephalopathy - Resolved now waxing and waning.  ?Developing ICU delirium.  Most likely due to infection/sepsis, ?opioid withdrawal. LP on admisison was unable to be obtained after multiple attempts, due to patient agitation and inability to lay still. Briefly on Precedex POD1 s/p extubation in ICU -- Treat infection and other underlying conditions as outlined --Manage pain adequately --Aspiration precautions --Delirium precautions  Thrombocytopenia -Improving.  Likely due to sepsis, DIC was a concern. S/p platelet transfusions early in admission. -- Hematology consulted  Acute kidney injury -Resolved.  Likely due to sepsis, dehydration, possibly NSAID use. Renal function improved with IV fluids. --Monitor BMP  Hypokalemia -refractory.   11/20: K 3.2, down from 3.3 despite total 80 mEq replaced yesterday. K continues to be replaced daily. With hypertension as well, consider hyperaldosteronism. --Replace w PO Kcl 40 mEq x 3 today --Pending urine potassium creatinine --Pending aldo/renin activity ratio --Monitor BMP, replace as needed  Hypertension with hypertensive urgency - Essential Hypertension - not on meds outpatient, does not follow w physicians 11/21: BPs much better, now controlled with intermittent readings systolic [id number] 50 --Stopped Cardene drip 11/18 --Started losartan 25>>50 mg daily --Amlodipine 10 mg added --Monitor renal function & electrolytes --PRN hydralazine PO or IV labetalol --Monitor BP closely --Urine studies and renin-aldo activity ratio pending given also refractory hypokalemia  Acute Urinary Retention - reported onset overnight 11/19-20, ongoing this AM. 11/21: Has required multiple in and out caths for ongoing  retention --Replace Foley --  Consult urology --Continue Flomax for now  Transaminitis - Resolved. POA with AST elevated, possibly due to shock liver versus alcohol abuse although wife reports patient seldom drinks. Right upper quadrant ultrasound showed gallbladder sludge without cholelithiasis, nonobstructing right renal calculi similar to that seen on prior CT. --Follow LFTs   Patient BMI: Body mass index is 23.9 kg/m.   DVT prophylaxis: enoxaparin (LOVENOX) injection 40 mg Start: 12/02/20 2200 SCD's Start: 12/01/20 1907   Diet:  Diet Orders (From admission, onward)     Start     Ordered   12/02/20 1505  Diet regular Room service appropriate? Yes; Fluid consistency: Thin  Diet effective now       Question Answer Comment  Room service appropriate? Yes   Fluid consistency: Thin      12/02/20 1504              Code Status: Full Code   Subjective 12/06/20    Pt up in recliner after walking with physical therapy this morning.  He did have exacerbated pain in his neck and low back after ambulating.  He has not been able to spontaneously void and requiring and out caths.  Prefers to have Foley replaced.  Denies history of this.  Still has not had any significant bowel movement and feels uncomfortable.  No other acute complaints.   Disposition Plan & Communication   Status is: Inpatient  Remains inpatient appropriate because: Severity of illness.  Remains on IV therapies with evaluation pending for MRSA bacteremia and cervical spinal abscess.  Evaluation ongoing, repeat cultures pending.  Now having acute urinary retention.   Family Communication: Wife and best friend at bedside during encounter 11/21   Consults, Procedures, Significant Events   Consultants:  Infectious disease Neurosurgery PCCM Neurology Hematology/oncology  Procedures:  Washout of cervical spinal epidural abscess  Antimicrobials:  Anti-infectives (From admission, onward)    Start      Dose/Rate Route Frequency Ordered Stop   12/03/20 1000  vancomycin (VANCOREADY) IVPB 1000 mg/200 mL  Status:  Discontinued        1,000 mg 200 mL/hr over 60 Minutes Intravenous Every 12 hours 12/03/20 0850 12/03/20 0850   12/03/20 1000  vancomycin (VANCOREADY) IVPB 1500 mg/300 mL        1,500 mg 150 mL/hr over 120 Minutes Intravenous Every 12 hours 12/03/20 0850     12/01/20 1630  ceftaroline (TEFLARO) 600 mg in sodium chloride 0.9 % 100 mL IVPB        600 mg 100 mL/hr over 60 Minutes Intravenous Every 8 hours 12/01/20 1533     12/01/20 0800  cefTRIAXone (ROCEPHIN) 2 g in sodium chloride 0.9 % 100 mL IVPB  Status:  Discontinued        2 g 200 mL/hr over 30 Minutes Intravenous Every 24 hours 11/30/20 2311 12/01/20 0032   12/01/20 0700  vancomycin (VANCOREADY) IVPB 1000 mg/200 mL  Status:  Discontinued        1,000 mg 200 mL/hr over 60 Minutes Intravenous Every 12 hours 11/30/20 2319 12/03/20 0850   12/01/20 0700  ceFEPIme (MAXIPIME) 2 g in sodium chloride 0.9 % 100 mL IVPB  Status:  Discontinued        2 g 200 mL/hr over 30 Minutes Intravenous Every 8 hours 12/01/20 0054 12/01/20 0128   12/01/20 0618  metroNIDAZOLE (FLAGYL) IVPB 500 mg  Status:  Discontinued        500 mg 100 mL/hr over 60 Minutes Intravenous Every 6 hours 12/01/20  0031 12/01/20 1237   12/01/20 0600  cefTRIAXone (ROCEPHIN) 2 g in sodium chloride 0.9 % 100 mL IVPB  Status:  Discontinued        2 g 200 mL/hr over 30 Minutes Intravenous Every 12 hours 12/01/20 0035 12/01/20 0049   12/01/20 0045  cefTRIAXone (ROCEPHIN) 2 g in sodium chloride 0.9 % 100 mL IVPB  Status:  Discontinued        2 g 200 mL/hr over 30 Minutes Intravenous Every 12 hours 12/01/20 0032 12/01/20 0035   12/01/20 0000  metroNIDAZOLE (FLAGYL) IVPB 500 mg  Status:  Discontinued        500 mg 100 mL/hr over 60 Minutes Intravenous Every 12 hours 11/30/20 2311 12/01/20 0031   11/30/20 2300  ceFEPIme (MAXIPIME) 2 g in sodium chloride 0.9 % 100 mL IVPB  Status:   Discontinued        2 g 200 mL/hr over 30 Minutes Intravenous Every 8 hours 11/30/20 1816 12/01/20 0000   11/30/20 1930  acyclovir (ZOVIRAX) 725 mg in dextrose 5 % 150 mL IVPB  Status:  Discontinued        10 mg/kg  72.6 kg 164.5 mL/hr over 60 Minutes Intravenous Every 8 hours 11/30/20 1915 12/01/20 0032   11/30/20 1500  vancomycin (VANCOREADY) IVPB 1750 mg/350 mL        1,750 mg 175 mL/hr over 120 Minutes Intravenous  Once 11/30/20 1443 11/30/20 2200   11/30/20 1445  ceFEPIme (MAXIPIME) 2 g in sodium chloride 0.9 % 100 mL IVPB        2 g 200 mL/hr over 30 Minutes Intravenous  Once 11/30/20 1436 11/30/20 1728   11/30/20 1445  metroNIDAZOLE (FLAGYL) IVPB 500 mg        500 mg 100 mL/hr over 60 Minutes Intravenous  Once 11/30/20 1436 11/30/20 1835   11/30/20 1445  vancomycin (VANCOCIN) IVPB 1000 mg/200 mL premix  Status:  Discontinued        1,000 mg 200 mL/hr over 60 Minutes Intravenous  Once 11/30/20 1436 11/30/20 1443         Micro    Objective   Vitals:   12/06/20 0300 12/06/20 0400 12/06/20 0500 12/06/20 0800  BP:  (!) 151/95  (!) 149/94  Pulse: 73 75 68 80  Resp: (!) 21 17  18   Temp:    98.1 F (36.7 C)  TempSrc:    Oral  SpO2: 94% 96% 95% 94%  Weight:      Height:        Intake/Output Summary (Last 24 hours) at 12/06/2020 1537 Last data filed at 12/06/2020 1405 Gross per 24 hour  Intake 854.92 ml  Output 475 ml  Net 379.92 ml   Filed Weights   11/30/20 1311 11/30/20 2216  Weight: 72.6 kg 73.4 kg    Physical Exam:  General exam: awake, drowsy, up in recliner, no acute distress Respiratory system: normal respiratory effort, on room air. Cardiovascular system: regular rate and rhythm, no peripheral edema Central nervous system: A&Ox4, no gross focal deficits, normal speech Extremities: no cyanosis, normal tone, moves all Psychiatry: normal mood, intact judgement and insight  Labs   Data Reviewed: I have personally reviewed following labs and imaging  studies  CBC: Recent Labs  Lab 11/30/20 1310 12/01/20 0030 12/01/20 1047 12/02/20 0327 12/04/20 0428 12/05/20 0440 12/06/20 0436  WBC 14.2*   < > 15.6* 15.2* 20.8* 22.9* 17.8*  NEUTROABS 12.8*  --  14.0* 13.5*  --   --  14.0*  HGB 12.8*   < > 11.0* 11.3* 10.4* 9.4* 9.0*  HCT 36.6*   < > 31.8* 32.6* 30.1* 27.6* 26.0*  MCV 85.1   < > 84.4 84.9 85.8 83.9 84.4  PLT 125*   < > 104* 92* 124* 149* 199   < > = values in this interval not displayed.   Basic Metabolic Panel: Recent Labs  Lab 11/30/20 1310 12/01/20 0157 12/02/20 0327 12/02/20 1309 12/02/20 1912 12/03/20 0755 12/04/20 0428 12/05/20 0440 12/06/20 0436  NA 135   < > 139  --  138 138 138 137 134*  K 3.0*   < > 2.6*   < > 3.1* 3.4* 3.3* 3.2* 3.7  CL 99   < > 100  --  106 103 104 101 104  CO2 23   < > 26  --  27 29 28 26 25   GLUCOSE 117*   < > 171*  --  181* 191* 110* 101* 102*  BUN 54*   < > 36*  --  38* 33* 38* 32* 33*  CREATININE 1.58*   < > 0.76  --  0.69 0.61 0.62 0.72 0.60*  CALCIUM 8.4*   < > 8.1*  --  8.3* 7.8* 7.9* 7.6* 7.4*  MG 2.4  --  2.3  --   --   --   --  1.8  --    < > = values in this interval not displayed.   GFR: Estimated Creatinine Clearance: 115.4 mL/min (A) (by C-G formula based on SCr of 0.6 mg/dL (L)). Liver Function Tests: Recent Labs  Lab 11/30/20 1310 12/01/20 0157 12/02/20 0327 12/03/20 0755  AST 102* 97* 40 33  ALT 55* 49* 32 27  ALKPHOS 166* 129* 107 111  BILITOT 1.1 1.5* 1.4* 1.1  PROT 6.5 5.3* 5.2* 5.1*  ALBUMIN 2.9* 2.2* 1.9* 2.0*   No results for input(s): LIPASE, AMYLASE in the last 168 hours. Recent Labs  Lab 11/30/20 1454  AMMONIA 14   Coagulation Profile: Recent Labs  Lab 11/30/20 1310 12/01/20 0147 12/01/20 1047 12/02/20 0327  INR 1.2 1.3* 1.4* 1.2   Cardiac Enzymes: No results for input(s): CKTOTAL, CKMB, CKMBINDEX, TROPONINI in the last 168 hours. BNP (last 3 results) No results for input(s): PROBNP in the last 8760 hours. HbA1C: No results for  input(s): HGBA1C in the last 72 hours.  CBG: Recent Labs  Lab 12/01/20 1131  GLUCAP 97   Lipid Profile: No results for input(s): CHOL, HDL, LDLCALC, TRIG, CHOLHDL, LDLDIRECT in the last 72 hours.  Thyroid Function Tests: No results for input(s): TSH, T4TOTAL, FREET4, T3FREE, THYROIDAB in the last 72 hours.  Anemia Panel: No results for input(s): VITAMINB12, FOLATE, FERRITIN, TIBC, IRON, RETICCTPCT in the last 72 hours. Sepsis Labs: Recent Labs  Lab 11/30/20 1310 11/30/20 1643 11/30/20 2237 12/01/20 0147 12/01/20 0157 12/06/20 0436  PROCALCITON 17.28  --   --   --  22.89 2.23  LATICACIDVEN 2.0* 2.2* 1.7 1.9  --   --     Recent Results (from the past 240 hour(s))  Blood Culture (routine x 2)     Status: Abnormal   Collection Time: 11/30/20  2:54 PM   Specimen: BLOOD  Result Value Ref Range Status   Specimen Description   Final    BLOOD LEFT ANTECUBITAL Performed at [facility], [address] [place], [place] [address]    Special Requests   Final    BOTTLES DRAWN AEROBIC AND ANAEROBIC Blood Culture  results may not be optimal due to an excessive volume of blood received in culture bottles Performed at [facility], [address] [place], [place] [address]    Culture  Setup Time   Final    Organism ID to follow [place] CRITICAL RESULT CALLED TO, READ BACK BY AND VERIFIED WITH: NATHAN BELUE @0011  ON 12/01/20 SKL    Culture METHICILLIN RESISTANT STAPHYLOCOCCUS AUREUS (A)  Final   Report Status 12/03/2020 FINAL  Final   Organism ID, Bacteria METHICILLIN RESISTANT STAPHYLOCOCCUS AUREUS  Final      Susceptibility   Methicillin resistant staphylococcus aureus - MIC*    CIPROFLOXACIN >=8 RESISTANT Resistant     ERYTHROMYCIN >=8 RESISTANT Resistant     GENTAMICIN <=0.5 SENSITIVE Sensitive     OXACILLIN >=4 RESISTANT Resistant     TETRACYCLINE <=1 SENSITIVE Sensitive     VANCOMYCIN 1 SENSITIVE  Sensitive     TRIMETH/SULFA <=10 SENSITIVE Sensitive     CLINDAMYCIN <=0.25 SENSITIVE Sensitive     RIFAMPIN <=0.5 SENSITIVE Sensitive     Inducible Clindamycin NEGATIVE Sensitive     * METHICILLIN RESISTANT STAPHYLOCOCCUS AUREUS  Blood Culture (routine x 2)     Status: Abnormal   Collection Time: 11/30/20  2:54 PM   Specimen: BLOOD  Result Value Ref Range Status   Specimen Description   Final    BLOOD BLOOD RIGHT HAND Performed at [facility], [address], [place], [place] [address]    Special Requests   Final    BOTTLES DRAWN AEROBIC AND ANAEROBIC Blood Culture adequate volume Performed at [facility], [address] [place], [place] [address]    Culture  Setup Time   Final    GRAM POSITIVE COCCI IN BOTH AEROBIC AND ANAEROBIC BOTTLES CRITICAL VALUE NOTED.  VALUE IS CONSISTENT WITH PREVIOUSLY REPORTED AND CALLED VALUE.    Culture (A)  Final    STAPHYLOCOCCUS AUREUS SUSCEPTIBILITIES PERFORMED ON PREVIOUS CULTURE WITHIN THE LAST 5 DAYS. Performed at [facility], [place] [address] [place], [place] [address]    Report Status 12/03/2020 FINAL  Final  Resp Panel by RT-PCR (Flu A&B, Covid) Nasopharyngeal Swab     Status: None   Collection Time: 11/30/20  2:54 PM   Specimen: Nasopharyngeal Swab; Nasopharyngeal(NP) swabs in vial transport medium  Result Value Ref Range Status   SARS Coronavirus 2 by RT PCR NEGATIVE NEGATIVE Final    Comment: (NOTE) SARS-CoV-2 target nucleic acids are NOT DETECTED.  The SARS-CoV-2 RNA is generally detectable in upper respiratory specimens during the acute phase of infection. The lowest concentration of SARS-CoV-2 viral copies this assay can detect is 138 copies/mL. A negative result does not preclude SARS-Cov-2 infection and should not be used as the sole basis for treatment or other patient management decisions. A negative result may occur with  improper specimen collection/handling, submission of specimen other than  nasopharyngeal swab, presence of viral mutation(s) within the areas targeted by this assay, and inadequate number of viral copies(<138 copies/mL). A negative result must be combined with clinical observations, patient history, and epidemiological information. The expected result is Negative.  Fact Sheet for Patients:  EntrepreneurPulse.com.au  Fact Sheet for Healthcare Providers:  IncredibleEmployment.be  This test is no t yet approved or cleared by the [place] FDA and  has been authorized for detection and/or diagnosis of SARS-CoV-2 by FDA under an Emergency Use Authorization (EUA). This EUA will remain  in effect (  meaning this test can be used) for the duration of the COVID-19 declaration under Section 564(b)(1) of the Act, 21 U.S.C.section 360bbb-3(b)(1), unless the authorization is terminated  or revoked sooner.       Influenza A by PCR NEGATIVE NEGATIVE Final   Influenza B by PCR NEGATIVE NEGATIVE Final    Comment: (NOTE) The Xpert Xpress SARS-CoV-2/FLU/RSV plus assay is intended as an aid in the diagnosis of influenza from Nasopharyngeal swab specimens and should not be used as a sole basis for treatment. Nasal washings and aspirates are unacceptable for Xpert Xpress SARS-CoV-2/FLU/RSV testing.  Fact Sheet for Patients: EntrepreneurPulse.com.au  Fact Sheet for Healthcare Providers: IncredibleEmployment.be  This test is not yet approved or cleared by the [place] FDA and has been authorized for detection and/or diagnosis of SARS-CoV-2 by FDA under an Emergency Use Authorization (EUA). This EUA will remain in effect (meaning this test can be used) for the duration of the COVID-19 declaration under Section 564(b)(1) of the Act, 21 U.S.C. section 360bbb-3(b)(1), unless the authorization is terminated or revoked.  Performed at [facility], [address] [place], [place]  [address]   Blood Culture ID Panel (Reflexed)     Status: Abnormal   Collection Time: 11/30/20  2:54 PM  Result Value Ref Range Status   Enterococcus faecalis NOT DETECTED NOT DETECTED Final   Enterococcus Faecium NOT DETECTED NOT DETECTED Final   Listeria monocytogenes NOT DETECTED NOT DETECTED Final   Staphylococcus species DETECTED (A) NOT DETECTED Final    Comment: CRITICAL RESULT CALLED TO, READ BACK BY AND VERIFIED WITH: NATHAN BELUE @0011  ON 12/01/20 SKL    Staphylococcus aureus (BCID) DETECTED (A) NOT DETECTED Final    Comment: Methicillin (oxacillin)-resistant Staphylococcus aureus (MRSA). MRSA is predictably resistant to beta-lactam antibiotics (except ceftaroline). Preferred therapy is vancomycin unless clinically contraindicated. Patient requires contact precautions if  hospitalized. CRITICAL RESULT CALLED TO, READ BACK BY AND VERIFIED WITH: NATHAN BELUE @0011  ON 12/01/20 SKL    Staphylococcus epidermidis NOT DETECTED NOT DETECTED Final   Staphylococcus lugdunensis NOT DETECTED NOT DETECTED Final   Streptococcus species NOT DETECTED NOT DETECTED Final   Streptococcus agalactiae NOT DETECTED NOT DETECTED Final   Streptococcus pneumoniae NOT DETECTED NOT DETECTED Final   Streptococcus pyogenes NOT DETECTED NOT DETECTED Final   A.calcoaceticus-baumannii NOT DETECTED NOT DETECTED Final   Bacteroides fragilis NOT DETECTED NOT DETECTED Final   Enterobacterales NOT DETECTED NOT DETECTED Final   Enterobacter cloacae complex NOT DETECTED NOT DETECTED Final   Escherichia coli NOT DETECTED NOT DETECTED Final   Klebsiella aerogenes NOT DETECTED NOT DETECTED Final   Klebsiella oxytoca NOT DETECTED NOT DETECTED Final   Klebsiella pneumoniae NOT DETECTED NOT DETECTED Final   Proteus species NOT DETECTED NOT DETECTED Final   Salmonella species NOT DETECTED NOT DETECTED Final   Serratia marcescens NOT DETECTED NOT DETECTED Final   Haemophilus influenzae NOT DETECTED NOT DETECTED Final    Neisseria meningitidis NOT DETECTED NOT DETECTED Final   Pseudomonas aeruginosa NOT DETECTED NOT DETECTED Final   Stenotrophomonas maltophilia NOT DETECTED NOT DETECTED Final   Candida albicans NOT DETECTED NOT DETECTED Final   Candida auris NOT DETECTED NOT DETECTED Final   Candida glabrata NOT DETECTED NOT DETECTED Final   Candida krusei NOT DETECTED NOT DETECTED Final   Candida parapsilosis NOT DETECTED NOT DETECTED Final   Candida tropicalis NOT DETECTED NOT DETECTED Final   Cryptococcus neoformans/gattii NOT DETECTED NOT DETECTED Final   Meth resistant mecA/C and MREJ DETECTED (A) NOT  DETECTED Final    Comment: CRITICAL RESULT CALLED TO, READ BACK BY AND VERIFIED WITH: NATHAN BELUE @0011  ON 12/01/20 SKL Performed at [facility], [address] [place], [place] [address]   Urine Culture     Status: None   Collection Time: 12/01/20  6:18 AM   Specimen: Urine, Clean Catch  Result Value Ref Range Status   Specimen Description   Final    URINE, CLEAN CATCH Performed at [facility], [address], [place], [place] [address]    Special Requests   Final    NONE Performed at [facility], [address] [place], [place] [address]    Culture   Final    NO GROWTH Performed at [facility], [place] [address] [place], [place] [address]    Report Status 12/02/2020 FINAL  Final  MRSA Next Gen by PCR, Nasal     Status: Abnormal   Collection Time: 12/01/20  8:40 AM   Specimen: Nasal Mucosa; Nasal Swab  Result Value Ref Range Status   MRSA by PCR Next Gen DETECTED (A) NOT DETECTED Final    Comment: RESULT CALLED TO, READ BACK BY AND VERIFIED WITH: CHARLIE FLEETWOOD 12/01/2020 1447 JGF Performed at [facility], [place], [place] [address]   Aerobic Culture w Gram Stain (superficial specimen)     Status: None   Collection Time: 12/01/20 12:44 PM   Specimen: Wound  Result Value Ref Range Status   Specimen Description    Final    WOUND Performed at [facility], [address] [place], [place] [address]    Special Requests   Final    NONE Performed at [facility], [address] [place], [place] [address]    Gram Stain   Final    RARE WBC PRESENT, PREDOMINANTLY MONONUCLEAR RARE GRAM POSITIVE COCCI Performed at [facility], [place] [address], [place], [place] [address]    Culture FEW METHICILLIN RESISTANT STAPHYLOCOCCUS AUREUS  Final   Report Status 12/04/2020 FINAL  Final   Organism ID, Bacteria METHICILLIN RESISTANT STAPHYLOCOCCUS AUREUS  Final      Susceptibility   Methicillin resistant staphylococcus aureus - MIC*    CIPROFLOXACIN >=8 RESISTANT Resistant     ERYTHROMYCIN >=8 RESISTANT Resistant     GENTAMICIN <=0.5 SENSITIVE Sensitive     OXACILLIN >=4 RESISTANT Resistant     TETRACYCLINE <=1 SENSITIVE Sensitive     VANCOMYCIN <=0.5 SENSITIVE Sensitive     TRIMETH/SULFA <=10 SENSITIVE Sensitive     CLINDAMYCIN <=0.25 SENSITIVE Sensitive     RIFAMPIN <=0.5 SENSITIVE Sensitive     Inducible Clindamycin NEGATIVE Sensitive     * FEW METHICILLIN RESISTANT STAPHYLOCOCCUS AUREUS  Aerobic/Anaerobic Culture w Gram Stain (surgical/deep wound)     Status: None (Preliminary result)   Collection Time: 12/01/20  5:55 PM   Specimen: PATH Cytology Misc. fluid; Body Fluid  Result Value Ref Range Status   Specimen Description   Final    WOUND EPIDURALABSCESS Performed at [facility], [address], [place], [place] [address]    Special Requests   Final    NONE Performed at [facility], [place], [place] [address]    Gram Stain   Final    NO SQUAMOUS EPITHELIAL CELLS SEEN FEW WBC SEEN FEW GRAM POSITIVE COCCI Performed at [facility], [place] [address] [place], [place] [address]    Culture   Final  FEW METHICILLIN RESISTANT STAPHYLOCOCCUS AUREUS NO ANAEROBES ISOLATED; CULTURE IN PROGRESS FOR 5 DAYS    Report Status PENDING   Incomplete   Organism ID, Bacteria METHICILLIN RESISTANT STAPHYLOCOCCUS AUREUS  Final      Susceptibility   Methicillin resistant staphylococcus aureus - MIC*    CIPROFLOXACIN >=8 RESISTANT Resistant     ERYTHROMYCIN >=8 RESISTANT Resistant     GENTAMICIN <=0.5 SENSITIVE Sensitive     OXACILLIN >=4 RESISTANT Resistant     TETRACYCLINE <=1 SENSITIVE Sensitive     VANCOMYCIN 1 SENSITIVE Sensitive     TRIMETH/SULFA <=10 SENSITIVE Sensitive     CLINDAMYCIN <=0.25 SENSITIVE Sensitive     RIFAMPIN <=0.5 SENSITIVE Sensitive     Inducible Clindamycin NEGATIVE Sensitive     * FEW METHICILLIN RESISTANT STAPHYLOCOCCUS AUREUS  CULTURE, BLOOD (ROUTINE X 2) w Reflex to ID Panel     Status: Abnormal   Collection Time: 12/02/20  7:12 PM   Specimen: BLOOD  Result Value Ref Range Status   Specimen Description   Final    BLOOD LEFT ANTECUBITAL Performed at [facility], [address], [place], [place] [address]    Special Requests   Final    BOTTLES DRAWN AEROBIC AND ANAEROBIC Blood Culture adequate volume Performed at [facility], [address] [place], [place] [address]    Culture  Setup Time   Final    GRAM POSITIVE COCCI ANAEROBIC BOTTLE ONLY CRITICAL VALUE NOTED.  VALUE IS CONSISTENT WITH PREVIOUSLY REPORTED AND CALLED VALUE. Performed at [facility], [address] [place], [place] [address]    Culture (A)  Final    STAPHYLOCOCCUS AUREUS SUSCEPTIBILITIES PERFORMED ON PREVIOUS CULTURE WITHIN THE LAST 5 DAYS. Performed at [facility], [place] [address] [place], [place] [address]    Report Status 12/06/2020 FINAL  Final  CULTURE, BLOOD (ROUTINE X 2) w Reflex to ID Panel     Status: Abnormal   Collection Time: 12/02/20  7:13 PM   Specimen: BLOOD  Result Value Ref Range Status   Specimen Description   Final    BLOOD BLOOD LEFT WRIST Performed at [facility], [address], [place], [place] [address]    Special Requests   Final     BOTTLES DRAWN AEROBIC AND ANAEROBIC Blood Culture adequate volume Performed at [facility], [address] [place], [place] [address]    Culture  Setup Time   Final    GRAM POSITIVE COCCI ANAEROBIC BOTTLE ONLY CRITICAL RESULT CALLED TO, READ BACK BY AND VERIFIED WITH: SHEEMA HALLAJI AT 2030 ON 12/03/20 BY SS    Culture (A)  Final    STAPHYLOCOCCUS AUREUS SUSCEPTIBILITIES PERFORMED ON PREVIOUS CULTURE WITHIN THE LAST 5 DAYS. Performed at [facility], [place] [address], [place], [place] [address]    Report Status 12/05/2020 FINAL  Final  Blood Culture ID Panel (Reflexed)     Status: Abnormal   Collection Time: 12/02/20  7:13 PM  Result Value Ref Range Status   Enterococcus faecalis NOT DETECTED NOT DETECTED Final   Enterococcus Faecium NOT DETECTED NOT DETECTED Final   Listeria monocytogenes NOT DETECTED NOT DETECTED Final   Staphylococcus species DETECTED (A) NOT DETECTED Final    Comment: CRITICAL RESULT CALLED TO, READ BACK BY AND VERIFIED WITH: SHEEMA HALLAJI AT 2030 ON 12/03/20 BY SS    Staphylococcus aureus (BCID) DETECTED (A) NOT DETECTED Final    Comment: Methicillin (oxacillin)-resistant Staphylococcus aureus (MRSA). MRSA is predictably resistant  to beta-lactam antibiotics (except ceftaroline). Preferred therapy is vancomycin unless clinically contraindicated. Patient requires contact precautions if  hospitalized. CRITICAL RESULT CALLED TO, READ BACK BY AND VERIFIED WITH: SHEEMA HALLAJI AT 2030 ON 12/03/20 BY SS    Staphylococcus epidermidis NOT DETECTED NOT DETECTED Final   Staphylococcus lugdunensis NOT DETECTED NOT DETECTED Final   Streptococcus species NOT DETECTED NOT DETECTED Final   Streptococcus agalactiae NOT DETECTED NOT DETECTED Final   Streptococcus pneumoniae NOT DETECTED NOT DETECTED Final   Streptococcus pyogenes NOT DETECTED NOT DETECTED Final   A.calcoaceticus-baumannii NOT DETECTED NOT DETECTED Final   Bacteroides fragilis NOT DETECTED  NOT DETECTED Final   Enterobacterales NOT DETECTED NOT DETECTED Final   Enterobacter cloacae complex NOT DETECTED NOT DETECTED Final   Escherichia coli NOT DETECTED NOT DETECTED Final   Klebsiella aerogenes NOT DETECTED NOT DETECTED Final   Klebsiella oxytoca NOT DETECTED NOT DETECTED Final   Klebsiella pneumoniae NOT DETECTED NOT DETECTED Final   Proteus species NOT DETECTED NOT DETECTED Final   Salmonella species NOT DETECTED NOT DETECTED Final   Serratia marcescens NOT DETECTED NOT DETECTED Final   Haemophilus influenzae NOT DETECTED NOT DETECTED Final   Neisseria meningitidis NOT DETECTED NOT DETECTED Final   Pseudomonas aeruginosa NOT DETECTED NOT DETECTED Final   Stenotrophomonas maltophilia NOT DETECTED NOT DETECTED Final   Candida albicans NOT DETECTED NOT DETECTED Final   Candida auris NOT DETECTED NOT DETECTED Final   Candida glabrata NOT DETECTED NOT DETECTED Final   Candida krusei NOT DETECTED NOT DETECTED Final   Candida parapsilosis NOT DETECTED NOT DETECTED Final   Candida tropicalis NOT DETECTED NOT DETECTED Final   Cryptococcus neoformans/gattii NOT DETECTED NOT DETECTED Final   Meth resistant mecA/C and MREJ DETECTED (A) NOT DETECTED Final    Comment: CRITICAL RESULT CALLED TO, READ BACK BY AND VERIFIED WITH: SHEEMA HALLAJI AT 2030 ON 12/03/20 BY SS Performed at [facility], [address] [place], [place] [address]   CULTURE, BLOOD (ROUTINE X 2) w Reflex to ID Panel     Status: None (Preliminary result)   Collection Time: 12/04/20  4:28 AM   Specimen: BLOOD  Result Value Ref Range Status   Specimen Description BLOOD LAC  Final   Special Requests   Final    BOTTLES DRAWN AEROBIC AND ANAEROBIC Blood Culture adequate volume   Culture   Final    NO GROWTH 2 DAYS Performed at [facility], [address] [place], [place] [address]    Report Status PENDING  Incomplete  CULTURE, BLOOD (ROUTINE X 2) w Reflex to ID Panel     Status: None  (Preliminary result)   Collection Time: 12/04/20  4:37 AM   Specimen: BLOOD  Result Value Ref Range Status   Specimen Description BLOOD LHAND  Final   Special Requests   Final    BOTTLES DRAWN AEROBIC AND ANAEROBIC Blood Culture adequate volume   Culture   Final    NO GROWTH 2 DAYS Performed at [facility], [address] [place], [place] [address]    Report Status PENDING  Incomplete      Imaging Studies   No results found.   Medications   Scheduled Meds:  amLODipine  10 mg Oral Daily   Chlorhexidine Gluconate Cloth  6 each Topical Q0600   enoxaparin (LOVENOX) injection  40 mg Subcutaneous Q24H   feeding supplement  237 mL Oral TID BM   losartan  50 mg Oral Daily   multivitamin with  minerals  1 tablet Oral Daily   oxyCODONE  5 mg Oral Q6H   polyethylene glycol  17 g Oral Daily   senna-docusate  1 tablet Oral BID   tamsulosin  0.4 mg Oral QPC breakfast   Continuous Infusions:  ceFTAROline (TEFLARO) IV 600 mg (12/06/20 1444)   vancomycin 1,500 mg (12/06/20 0919)       LOS: 6 days    Time spent: 25 minutes with greater than 50% at bedside in coordination of care    Ezekiel Slocumb, DO [facility]  12/06/2020, 3:37 PM      If 7PM-7AM, please contact night-coverage. How to contact the [facility] Attending or Consulting provider [place] or covering provider during after hours [place], for this patient?    Check the care team in [facility] and look for a) attending/consulting TR[facility] provider listed and b) the [facility] team listed Log into [email or web address] and use [place] universal password to access. If you do not have the password, please contact the hospital operator. Locate the [facility] provider you are looking for under [facility] and page to a number that you can be directly reached. If you still have difficulty reaching the provider, please page the [facility] (Director on Call) for the Hospitalists listed on amion for assistance.

## 2020-12-06 NOTE — Progress Notes (Signed)
Physical Therapy Treatment Patient Details Name: Caleb Fisher MRN: [id number] DOB: 1975/01/21 Today's Date: 12/06/2020   History of Present Illness 45 year old male with no past medical history other than tobacco abuse, presented to the ED from home on 11/30/2020 with altered mental status, increased back pain and new onset neck pain, and frequent falls. MRI of the spine shows epidural cervical abscess around C2-C5.  Pt underwent C2-4 right hemilaminectomies for evacuation of epidural on 12/01/20.    PT Comments    Pt received sitting in recliner, agreeable to therapy. Pt appeared lethargic - turning lights on and engaging pt in conversation improved arousal prior to ambulation. Improvements made in today's session: pt was able to perform STS without HHA, ambulation distance increased. Steadiness did seem slightly decreased compared to previous session; pt held onto IV pole during ambulation. Wide BOS was noted during ambulation - possibly due to pushing IV pole. Vitals remained stable with activity. Pt was educated on performing UE and LE therex to encourage frequent activity and prevent joint stiffness. Would benefit from skilled PT to address above deficits and promote optimal return to PLOF.   Recommendations for follow up therapy are one component of a multi-disciplinary discharge planning process, led by the attending physician.  Recommendations may be updated based on patient status, additional functional criteria and insurance authorization.  Follow Up Recommendations  Home health PT     Assistance Recommended at Discharge Frequent or constant Supervision/Assistance  Equipment Recommendations  None recommended by PT    Recommendations for Other Services       Precautions / Restrictions Precautions Precautions: Fall Restrictions Weight Bearing Restrictions: No Other Position/Activity Restrictions: s/p C2-4 right hemilaminectomies - per MD notes no activity restrictions      Mobility  Bed Mobility               General bed mobility comments: Not observed - pt in recliner at beginning and end of session    Transfers Overall transfer level: Needs assistance Equipment used: None Transfers: Sit to/from Stand Sit to Stand: Min guard           General transfer comment: CGA to steady, wide BOS    Ambulation/Gait Ambulation/Gait assistance: Min guard Gait Distance (Feet): 180 Feet Assistive device: IV Pole Gait Pattern/deviations: Step-through pattern;Decreased stride length;Decreased step length - right;Decreased step length - left;Wide base of support Gait velocity: decreased     General Gait Details: Decreased steadiness with initial steps - pt utlized IV pole to steady. Wide BOS.   Stairs             Wheelchair Mobility    Modified Rankin (Stroke Patients Only)       Balance Overall balance assessment: Needs assistance Sitting-balance support: Feet supported;No upper extremity supported Sitting balance-Leahy Scale: Fair Sitting balance - Comments: Pt sat forward in chair recliner with limited endurance to remain forward   Standing balance support: During functional activity;Single extremity supported Standing balance-Leahy Scale: Fair Standing balance comment: No apparent LOB however moderate decreased steadiness in both standing and walking requiring CGA.                            Cognition Arousal/Alertness: Awake/alert Behavior During Therapy: WFL for tasks assessed/performed Overall Cognitive Status: Within Functional Limits for tasks assessed  Exercises General Exercises - Upper Extremity Shoulder Flexion: AROM;Strengthening;Both;5 reps;Seated General Exercises - Lower Extremity Ankle Circles/Pumps: Strengthening;AROM;Both;20 reps;Seated Long Arc Quad: AROM;Strengthening;Seated;Both;20 reps Hip Flexion/Marching: 20  reps;AROM;Strengthening;Seated;Both    General Comments        Pertinent Vitals/Pain Pain Assessment: 0-10 Pain Score: 5  Pain Location: global spine, especially cervical Pain Descriptors / Indicators: Pressure;Aching;Sharp Pain Intervention(s): Limited activity within patient's tolerance;Monitored during session;Repositioned    Home Living                          Prior Function            PT Goals (current goals can now be found in the care plan section) Acute Rehab PT Goals Patient Stated Goal: to get back to normal PT Goal Formulation: With patient Time For Goal Achievement: 12/17/20 Potential to Achieve Goals: Fair    Frequency    Min 2X/week      PT Plan      Co-evaluation              AM-PAC PT "6 Clicks" Mobility   Outcome Measure  Help needed turning from your back to your side while in a flat bed without using bedrails?: A Little Help needed moving from lying on your back to sitting on the side of a flat bed without using bedrails?: A Little Help needed moving to and from a bed to a chair (including a wheelchair)?: A Little Help needed standing up from a chair using your arms (e.g., wheelchair or bedside chair)?: A Little Help needed to walk in hospital room?: A Little Help needed climbing 3-5 steps with a railing? : A Little 6 Click Score: 18    End of Session Equipment Utilized During Treatment: Gait belt Activity Tolerance: Patient tolerated treatment well Patient left: with call bell/phone within reach;in chair;with family/visitor present;with nursing/sitter in room Nurse Communication: Mobility status PT Visit Diagnosis: Unsteadiness on feet (R26.81);Other abnormalities of gait and mobility (R26.89);Muscle weakness (generalized) (M62.81);Repeated falls (R29.6);Difficulty in walking, not elsewhere classified (R26.2)     Time: 8938-1017 PT Time Calculation (min) (ACUTE ONLY): 32 min  Charges:  $Gait Training: 8-22  mins $Therapeutic Exercise: 8-22 mins                     Patrina Levering PT, DPT 12/06/20 1:26 PM [phone]

## 2020-12-06 NOTE — Progress Notes (Signed)
Date of Admission:  11/30/2020     ID: Caleb Fisher is a 45 y.o. male  Active Problems:   Encephalopathy acute   Altered mental status   MRSA bacteremia   Abscess in epidural space of cervical spine   Hypertensive urgency   Hypokalemia   Thrombocytopenia (HCC)   Acute metabolic encephalopathy   Severe sepsis (HCC)    Subjective: Patient is doing okay Says his neck is hurting He is alert and oriented He participated with PT Medications:   amLODipine  10 mg Oral Daily   Chlorhexidine Gluconate Cloth  6 each Topical Q0600   enoxaparin (LOVENOX) injection  40 mg Subcutaneous Q24H   feeding supplement  237 mL Oral TID BM   losartan  50 mg Oral Daily   multivitamin with minerals  1 tablet Oral Daily   oxyCODONE  5 mg Oral Q6H   polyethylene glycol  17 g Oral Daily   senna-docusate  1 tablet Oral BID   tamsulosin  0.4 mg Oral QPC breakfast    Objective: Vital signs in last 24 hours: Temp:  [98 F (36.7 C)-98.6 F (37 C)] 98.1 F (36.7 C) (11/21 0800) Pulse Rate:  [68-82] 80 (11/21 0800) Resp:  [16-23] 18 (11/21 0800) BP: (126-151)/(76-95) 149/94 (11/21 0800) SpO2:  [93 %-96 %] 94 % (11/21 0800)  PHYSICAL EXAM:  General: Alert, cooperative, no distress at rest .  Head: Normocephalic, without obvious abnormality, atraumatic. Eyes: Conjunctivae clear, anicteric sclerae. Pupils are equal ENT Nares normal. No drainage or sinus tenderness. Lips, mucosa, and tongue normal. No Thrush Nape of the neck surgical site is healthy no carotid bruit and no JVD. Back: No CVA tenderness. Lungs: Clear to auscultation bilaterally. No Wheezing or Rhonchi. No rales. Heart: Regular rate and rhythm, no murmur, rub or gallop. Abdomen: Soft, non-tender,not distended. Bowel sounds normal. No masses Foley in place Extremities: Excoriated lesions forearms. Skin: As above Lymph: Cervical, supraclavicular normal. Neurologic: Grossly non-focal  Lab Results Recent Labs    12/05/20 0440  12/06/20 0436  WBC 22.9* 17.8*  HGB 9.4* 9.0*  HCT 27.6* 26.0*  NA 137 134*  K 3.2* 3.7  CL 101 104  CO2 26 25  BUN 32* 33*  CREATININE 0.72 0.60*   Liver Panel No results for input(s): PROT, ALBUMIN, AST, ALT, ALKPHOS, BILITOT, BILIDIR, IBILI in the last 72 hours.  Microbiology: 11/30/2020 blood culture 4 out of 4 MRSA MRSA nares positive Wound culture of the right forearm sent on 12/01/2020: Staph aureus 12/01/2020 epidural abscess culture: Staph aureus  12/02/2020 blood culture: Gram-positive cocci 12/04/20- BC- NG   Assessment/Plan:  MRSA bacteremia with disseminated infection causing epidural abscess and septic emboli in both lungs. Patient is on vancomycin and ceftaroline.   Repeat blood culture from 11/17  1 out of 2 sets positive.    repeat cultures from 11/19- neg so far.  Patient will need minimum 6 weeks of IV antibiotic. 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 PICC line cannot be placed until negative blood culture.  Cervical epidural abscess- C2-C5- underwent right hemilaminectomy C2-C4 washout of the abscess.  Cultures have been sent on the results of gram-positive cocci.  Bilateral pulmonary nodules.  Likely septic emboli.  2D echo done TEE not done as patient will need 6 weeks of IV antibiotic for spine infection  Thrombocytopenia.  Likely related to infection has resolved.  Encephalopathy has  resolved  AKI has resolved.   Discussed the management with the patient and his nurse

## 2020-12-07 DIAGNOSIS — R338 Other retention of urine: Secondary | ICD-10-CM | POA: Diagnosis not present

## 2020-12-07 DIAGNOSIS — G061 Intraspinal abscess and granuloma: Secondary | ICD-10-CM | POA: Diagnosis not present

## 2020-12-07 DIAGNOSIS — R7881 Bacteremia: Secondary | ICD-10-CM | POA: Diagnosis not present

## 2020-12-07 DIAGNOSIS — B9562 Methicillin resistant Staphylococcus aureus infection as the cause of diseases classified elsewhere: Secondary | ICD-10-CM | POA: Diagnosis not present

## 2020-12-07 LAB — CBC
HCT: 27.6 % — ABNORMAL LOW (ref 39.0–52.0)
Hemoglobin: 9.3 g/dL — ABNORMAL LOW (ref 13.0–17.0)
MCH: 29.2 pg (ref 26.0–34.0)
MCHC: 33.7 g/dL (ref 30.0–36.0)
MCV: 86.8 fL (ref 80.0–100.0)
Platelets: 284 10*3/uL (ref 150–400)
RBC: 3.18 MIL/uL — ABNORMAL LOW (ref 4.22–5.81)
RDW: 15.5 % (ref 11.5–15.5)
WBC: 20.2 10*3/uL — ABNORMAL HIGH (ref 4.0–10.5)
nRBC: 0 % (ref 0.0–0.2)

## 2020-12-07 LAB — BASIC METABOLIC PANEL
Anion gap: 6 (ref 5–15)
BUN: 27 mg/dL — ABNORMAL HIGH (ref 6–20)
CO2: 25 mmol/L (ref 22–32)
Calcium: 7.5 mg/dL — ABNORMAL LOW (ref 8.9–10.3)
Chloride: 104 mmol/L (ref 98–111)
Creatinine, Ser: 0.54 mg/dL — ABNORMAL LOW (ref 0.61–1.24)
GFR, Estimated: >60 mL/min (ref >60)
Glucose, Bld: 103 mg/dL — ABNORMAL HIGH (ref 70–99)
Potassium: 3.8 mmol/L (ref 3.5–5.1)
Sodium: 135 mmol/L (ref 135–145)

## 2020-12-07 LAB — PROCALCITONIN: Procalcitonin: 1.18 ng/mL

## 2020-12-07 LAB — AEROBIC/ANAEROBIC CULTURE W GRAM STAIN (SURGICAL/DEEP WOUND): Gram Stain: NONE SEEN

## 2020-12-07 MED ORDER — VANCOMYCIN HCL 1000 MG/200ML IV SOLN
1000.0000 mg | Freq: Three times a day (TID) | INTRAVENOUS | Status: DC
  Administered 2020-12-07 – 2020-12-17 (×29): 1000 mg via INTRAVENOUS
  Filled 2020-12-07 (×33): qty 200

## 2020-12-07 NOTE — Progress Notes (Signed)
Physical Therapy Treatment Patient Details Name: Caleb Fisher MRN: [id number] DOB: 04/08/1975 Today's Date: 12/07/2020   History of Present Illness 45 year old male with no past medical history other than tobacco abuse, presented to the ED from home on 11/30/2020 with altered mental status, increased back pain and new onset neck pain, and frequent falls. MRI of the spine shows epidural cervical abscess around C2-C5.  Pt underwent C2-4 right hemilaminectomies for evacuation of epidural on 12/01/20.    PT Comments    Pt received supine in bed, supportive wife in room, agreeable to therapy. Pt remains CGA throughout session - including bed mobility, STS and ambulation. HHA provided in standing. Steadiness improved with time and distance during ambulation. He was able to increase ambulation distance to 213ft; gait speed has also increased. Pt remained in moderate pain throughout session however he does report pain did not increase with mobility. Continuous telemetry monitoring, vitals remained stable. Would benefit from skilled PT to address above deficits and promote optimal return to PLOF.    Recommendations for follow up therapy are one component of a multi-disciplinary discharge planning process, led by the attending physician.  Recommendations may be updated based on patient status, additional functional criteria and insurance authorization.  Follow Up Recommendations  Home health PT     Assistance Recommended at Discharge Frequent or constant Supervision/Assistance  Equipment Recommendations  None recommended by PT    Recommendations for Other Services       Precautions / Restrictions Precautions Precautions: Fall Restrictions Weight Bearing Restrictions: No Other Position/Activity Restrictions: s/p C2-4 right hemilaminectomies - per MD notes no activity restrictions     Mobility  Bed Mobility Overal bed mobility: Needs Assistance Bed Mobility: Supine to Sit;Sit to Supine      Supine to sit: Min guard Sit to supine: Min guard   General bed mobility comments: CGA for all bed mobility; encouragement for pt to complete without assist    Transfers Overall transfer level: Needs assistance Equipment used: 1 person hand held assist Transfers: Sit to/from Stand Sit to Stand: Min guard           General transfer comment: CGA and HHA to steady    Ambulation/Gait Ambulation/Gait assistance: Min guard Gait Distance (Feet): 220 Feet Assistive device: 1 person hand held assist Gait Pattern/deviations: Step-through pattern;Decreased stride length;Decreased step length - right;Decreased step length - left;Wide base of support       General Gait Details: Wife present to provide HHA. Steadying required with initial steps however balance improved with distance. Gait speed also increased with distance.   Stairs             Wheelchair Mobility    Modified Rankin (Stroke Patients Only)       Balance Overall balance assessment: Needs assistance Sitting-balance support: Feet supported;No upper extremity supported Sitting balance-Leahy Scale: Fair       Standing balance-Leahy Scale: Fair Standing balance comment: Standing balance improved over time.                            Cognition Arousal/Alertness: Awake/alert Behavior During Therapy: WFL for tasks assessed/performed Overall Cognitive Status: Within Functional Limits for tasks assessed                                 General Comments: Somewhat drowsy, improves with lights and mobility        Exercises  General Comments        Pertinent Vitals/Pain Pain Assessment: Faces Faces Pain Scale: Hurts even more Pain Location: global spine, especially cervical Pain Descriptors / Indicators: Pressure;Aching;[place]                          Prior Function            PT Goals (current goals can now be found in the care plan  section) Acute Rehab PT Goals Patient Stated Goal: to get back to normal PT Goal Formulation: With patient Time For Goal Achievement: 12/17/20 Potential to Achieve Goals: Fair    Frequency    Min 2X/week      PT Plan      Co-evaluation              AM-PAC PT "6 Clicks" Mobility   Outcome Measure  Help needed turning from your back to your side while in a flat bed without using bedrails?: A Little Help needed moving from lying on your back to sitting on the side of a flat bed without using bedrails?: A Little Help needed moving to and from a bed to a chair (including a wheelchair)?: A Little Help needed standing up from a chair using your arms (e.g., wheelchair or bedside chair)?: A Little Help needed to walk in hospital room?: A Little Help needed climbing 3-5 steps with a railing? : A Little 6 Click Score: 18    End of Session Equipment Utilized During Treatment: Gait belt Activity Tolerance: Patient tolerated treatment well;No increased pain Patient left: with call bell/phone within reach;with family/visitor present;in bed Nurse Communication: Mobility status PT Visit Diagnosis: Unsteadiness on feet (R26.81);Other abnormalities of gait and mobility (R26.89);Muscle weakness (generalized) (M62.81);Repeated falls (R29.6);Difficulty in walking, not elsewhere classified (R26.2)     Time: 4332-9518 PT Time Calculation (min) (ACUTE ONLY): 25 min  Charges:  $Gait Training: 8-22 mins $Therapeutic Activity: 8-22 mins                     Patrina Levering PT, DPT 12/07/20 12:57 PM [phone]

## 2020-12-07 NOTE — Progress Notes (Signed)
    Attending Progress Note  History: Caleb Fisher is a 45 y.o male presenting with AMS and sepsis. Blood cultures showed MRSA bacteremia. 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 Pt underwent C2-4 right hemilaminectomies for evacuation of epidural on 12/01/20  POD6: Increased neck pain today after therapy without any radicular complaints.  Patient's wife expressed concern about lesion on right fingertip.  POD4: NAEO. +urinary retention.  + constipation.  Had some confusion overnight.  POD3: NAEO. Having some pain.   POD2: NAEO. Pt extubated yesterday. Complains of neck pain and generalized fatigue. Remains on Cardene drip.  POD1: Pt remained intubated overnight for agitation.  Physical Exam: Vitals:   12/07/20 0400 12/07/20 0515  BP: (!) 165/101 (!) 150/85  Pulse: 72 66  Resp: 20 17  Temp: 98.3 F (36.8 C)   SpO2: 95% 96%     CNI Oriented x 2 Moving all extremities  Incision - c/d/i  Data:  Recent Labs  Lab 12/05/20 0440 12/06/20 0436 12/07/20 0522  NA 137 134* 135  K 3.2* 3.7 3.8  CL 101 104 104  CO2 26 25 25   BUN 32* 33* 27*  CREATININE 0.72 0.60* 0.54*  GLUCOSE 101* 102* 103*  CALCIUM 7.6* 7.4* 7.5*    Recent Labs  Lab 12/03/20 0755  AST 33  ALT 27  ALKPHOS 111      Recent Labs  Lab 12/05/20 0440 12/06/20 0436 12/07/20 0522  WBC 22.9* 17.8* 20.2*  HGB 9.4* 9.0* 9.3*  HCT 27.6* 26.0* 27.6*  PLT 149* 199 284    Recent Labs  Lab 12/01/20 0147 12/01/20 1047 12/02/20 0327  APTT 41* 40* 33  INR 1.3* 1.4* 1.2          Other tests/results:  MRI C-spine 11/30/20 IMPRESSION: 1. 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 2. No spinal cord signal change. 3. Small prevertebral effusion from C2-C5. 4. No acute intracranial abnormality. 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   Electronically Signed: By: Ulyses Jarred M.D. On: 11/30/2020 21:51  Assessment/Plan:  Caleb Fisher is a 45 y.o presenting with MRSA bacteremia and a cervical epidural abscess status post C2-4 right hemilaminectomies for evacuation of epidural on 12/01/20.  - ABX coverage per ID recommendations -Courage patient and wife to discuss finger lesion with primary team - no activity restrictions from a neurosurgical standpoint - q4 hr neuro checks - Continue DVT PPX - Remainder of plan per primary team and ID - Pain control - methocarbamol, toradol, acetaminophen, oxycodone.  Would limit narcotics. - Neurosurgery will follow peripherally as patient is stable neurologically.  Please contact with any questions or concerns.  Cooper Render PA-C Department of Neurosurgery

## 2020-12-07 NOTE — Progress Notes (Signed)
PROGRESS NOTE    Caleb Fisher   SMO:[id number]  DOB: 06-18-1975  PCP: Pcp, No    DOA: 11/30/2020 LOS: 7    Brief Narrative / Hospital Course to Date:   45 year old male with no past medical history other than tobacco abuse, presented to the ED from home on 11/30/2020 with altered mental status, increased back pain and new onset neck pain, and frequent falls.  Evaluation in the ED was consistent with severe sepsis from unclear source, with tachycardia and tachypnea.  Subsequently found to have MRSA bacteremia.  LP was attempted but unable to be completed due to patient agitation and unable to lie still.  Further imaging of the spine revealed a cervical epidural abscess involving C2-C5.  Started on empiric broad-spectrum antibiotics.  Labs also notable for profound hypokalemia, thrombocytopenia.  Admitted to [facility] service with infectious disease, neurosurgery, neurology and PCCM consulted.  Transferred to ICU.  Assessment & Plan   Active Problems:   Encephalopathy acute   Altered mental status   MRSA bacteremia   Abscess in epidural space of cervical spine   Hypertensive urgency   Hypokalemia   Thrombocytopenia (HCC)   Acute metabolic encephalopathy   Severe sepsis (HCC)   Severe sepsis -POA as evidenced by tachycardia, tachypnea, fevers, acute encephalopathy consistent with organ dysfunction and severe sepsis. Sepsis physiology resolved. 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 Septic pulmonary emboli --ID and neurosurgery consulted - see their recs --Abx per ID --On vancomycin and cetaroline (cefepime and Flagyl discontinued) --Neurosurgery took to OR washout of cervical epidural abscess washout and laminectomy 11/16 --Follow repeat cultures, CBC, fever curve --TTE 11/16: EF 70-75%, g1dd, neg for vegetations, consider TEE --PICC line once repeat Bcx's negative  Neurosurgery  recs:  "- ABX coverage per ID recommendations - no activity restrictions from a neurosurgical standpoint - q4 hr neuro checks - Continue DVT PPX - Pain control - methocarbamol, toradol, acetaminophen, oxycodone.  Would limit narcotics."  Acute metabolic encephalopathy - Resolved. Had some waxing and waning few days ago.   ?Developing ICU delirium.  Most likely due to infection/sepsis, ?opioid withdrawal. LP on admisison was unable to be obtained after multiple attempts, due to patient agitation and inability to lay still. Briefly on Precedex POD1 s/p extubation in ICU -- Treat infection and other underlying conditions as outlined --Manage pain adequately --Aspiration precautions --Delirium precautions  Thrombocytopenia -Resolved.  Likely due to sepsis, DIC was a concern. S/p platelet transfusions early in admission. -- Hematology consulted  Acute kidney injury -Resolved.  Likely due to sepsis, dehydration, possibly NSAID use. Renal function improved with IV fluids. --Monitor BMP  Hypokalemia -refractory, requiring daily replacement.   11/20: K 3.2, down from 3.3 despite 80 mEq replaced yesterday.  K continues to be replaced daily. With hypertension as well, consider hyperaldosteronism. 11/22: K 3.7>>3.8 today --Pending urine potassium creatinine --Pending aldo/renin activity ratio --Monitor BMP, replace as needed  Hypertension with hypertensive urgency - Essential Hypertension - not on meds outpatient, does not follow w physicians BPs much better, now controlled with intermittent readings systolic [id number] 50 --Stopped Cardene drip 11/18 --Started losartan 25>>50 mg daily --Amlodipine 10 mg added --Monitor renal function & electrolytes --PRN hydralazine PO or IV labetalol --Monitor BP closely --Urine studies and renin-aldo activity ratio pending given also refractory hypokalemia  Acute Urinary Retention - reported onset overnight 11/19-20, ongoing and required multiple in  and out caths for ongoing retention --Replace  Foley (11/21) --Urology consult vs follow up closely after d/c --Continue Flomax for now  Transaminitis - Resolved. POA with AST elevated, possibly due to shock liver versus alcohol abuse although wife reports patient seldom drinks. Right upper quadrant ultrasound showed gallbladder sludge without cholelithiasis, nonobstructing right renal calculi similar to that seen on prior CT. --Follow LFTs   Patient BMI: Body mass index is 23.9 kg/m.   DVT prophylaxis: enoxaparin (LOVENOX) injection 40 mg Start: 12/02/20 2200 SCD's Start: 12/01/20 1907   Diet:  Diet Orders (From admission, onward)     Start     Ordered   12/02/20 1505  Diet regular Room service appropriate? Yes; Fluid consistency: Thin  Diet effective now       Question Answer Comment  Room service appropriate? Yes   Fluid consistency: Thin      12/02/20 1504              Code Status: Full Code   Subjective 12/07/20    Pt seen ambulating with PT on the unit, then awake sitting up in bed.  Reports feeling okay, pain controlled fairly well.  Does have spikes in neck pain at times.  Still now bowel movement but declines suppository or enema for now.  No prior issue with prostate or urinary retention.   Disposition Plan & Communication   Status is: Inpatient  Remains inpatient appropriate because: Remains on IV therapies with evaluation pending for MRSA bacteremia and cervical spinal abscess.  Evaluation ongoing, repeat cultures pending.  Now having acute urinary retention.   Family Communication: Wife and best friend at bedside during encounter 11/21   Consults, Procedures, Significant Events   Consultants:  Infectious disease Neurosurgery PCCM Neurology Hematology/oncology  Procedures:  Washout of cervical spinal epidural abscess  Antimicrobials:  Anti-infectives (From admission, onward)    Start     Dose/Rate Route Frequency Ordered Stop   12/07/20  1800  vancomycin (VANCOREADY) IVPB 1000 mg/200 mL        1,000 mg 200 mL/hr over 60 Minutes Intravenous Every 8 hours 12/07/20 1630     12/03/20 1000  vancomycin (VANCOREADY) IVPB 1000 mg/200 mL  Status:  Discontinued        1,000 mg 200 mL/hr over 60 Minutes Intravenous Every 12 hours 12/03/20 0850 12/03/20 0850   12/03/20 1000  vancomycin (VANCOREADY) IVPB 1500 mg/300 mL  Status:  Discontinued        1,500 mg 150 mL/hr over 120 Minutes Intravenous Every 12 hours 12/03/20 0850 12/07/20 1630   12/01/20 1630  ceftaroline (TEFLARO) 600 mg in sodium chloride 0.9 % 100 mL IVPB        600 mg 100 mL/hr over 60 Minutes Intravenous Every 8 hours 12/01/20 1533     12/01/20 0800  cefTRIAXone (ROCEPHIN) 2 g in sodium chloride 0.9 % 100 mL IVPB  Status:  Discontinued        2 g 200 mL/hr over 30 Minutes Intravenous Every 24 hours 11/30/20 2311 12/01/20 0032   12/01/20 0700  vancomycin (VANCOREADY) IVPB 1000 mg/200 mL  Status:  Discontinued        1,000 mg 200 mL/hr over 60 Minutes Intravenous Every 12 hours 11/30/20 2319 12/03/20 0850   12/01/20 0700  ceFEPIme (MAXIPIME) 2 g in sodium chloride 0.9 % 100 mL IVPB  Status:  Discontinued        2 g 200 mL/hr over 30 Minutes Intravenous Every 8 hours 12/01/20 0054 12/01/20 0128   12/01/20 0618  metroNIDAZOLE (FLAGYL)  IVPB 500 mg  Status:  Discontinued        500 mg 100 mL/hr over 60 Minutes Intravenous Every 6 hours 12/01/20 0031 12/01/20 1237   12/01/20 0600  cefTRIAXone (ROCEPHIN) 2 g in sodium chloride 0.9 % 100 mL IVPB  Status:  Discontinued        2 g 200 mL/hr over 30 Minutes Intravenous Every 12 hours 12/01/20 0035 12/01/20 0049   12/01/20 0045  cefTRIAXone (ROCEPHIN) 2 g in sodium chloride 0.9 % 100 mL IVPB  Status:  Discontinued        2 g 200 mL/hr over 30 Minutes Intravenous Every 12 hours 12/01/20 0032 12/01/20 0035   12/01/20 0000  metroNIDAZOLE (FLAGYL) IVPB 500 mg  Status:  Discontinued        500 mg 100 mL/hr over 60 Minutes  Intravenous Every 12 hours 11/30/20 2311 12/01/20 0031   11/30/20 2300  ceFEPIme (MAXIPIME) 2 g in sodium chloride 0.9 % 100 mL IVPB  Status:  Discontinued        2 g 200 mL/hr over 30 Minutes Intravenous Every 8 hours 11/30/20 1816 12/01/20 0000   11/30/20 1930  acyclovir (ZOVIRAX) 725 mg in dextrose 5 % 150 mL IVPB  Status:  Discontinued        10 mg/kg  72.6 kg 164.5 mL/hr over 60 Minutes Intravenous Every 8 hours 11/30/20 1915 12/01/20 0032   11/30/20 1500  vancomycin (VANCOREADY) IVPB 1750 mg/350 mL        1,750 mg 175 mL/hr over 120 Minutes Intravenous  Once 11/30/20 1443 11/30/20 2200   11/30/20 1445  ceFEPIme (MAXIPIME) 2 g in sodium chloride 0.9 % 100 mL IVPB        2 g 200 mL/hr over 30 Minutes Intravenous  Once 11/30/20 1436 11/30/20 1728   11/30/20 1445  metroNIDAZOLE (FLAGYL) IVPB 500 mg        500 mg 100 mL/hr over 60 Minutes Intravenous  Once 11/30/20 1436 11/30/20 1835   11/30/20 1445  vancomycin (VANCOCIN) IVPB 1000 mg/200 mL premix  Status:  Discontinued        1,000 mg 200 mL/hr over 60 Minutes Intravenous  Once 11/30/20 1436 11/30/20 1443         Micro    Objective   Vitals:   12/07/20 0200 12/07/20 0300 12/07/20 0400 12/07/20 0515  BP:   (!) 165/101 (!) 150/85  Pulse: 67 70 72 66  Resp: (!) 22 19 20 17   Temp:   98.3 F (36.8 C)   TempSrc:   Oral   SpO2: 94% 95% 95% 96%  Weight:      Height:        Intake/Output Summary (Last 24 hours) at 12/07/2020 1644 Last data filed at 12/07/2020 0500 Gross per 24 hour  Intake 800 ml  Output 850 ml  Net -50 ml   Filed Weights   11/30/20 1311 11/30/20 2216  Weight: 72.6 kg 73.4 kg    Physical Exam:  General exam: awake, alert, no acute distress HEENT: posterior midline neck incision healing well without swelling or surrounding erythema Respiratory system: CTAB, normal respiratory effort, on room air. Cardiovascular system: regular rate and rhythm, no peripheral edema Central nervous system: A&Ox4, no  gross focal deficits, normal speech Extremities: no cyanosis, normal tone, moves all Psychiatry: normal mood, intact judgement and insight  Labs   Data Reviewed: I have personally reviewed following labs and imaging studies  CBC: Recent Labs  Lab 12/01/20 1047 12/02/20 0327  12/04/20 0428 12/05/20 0440 12/06/20 0436 12/07/20 0522  WBC 15.6* 15.2* 20.8* 22.9* 17.8* 20.2*  NEUTROABS 14.0* 13.5*  --   --  14.0*  --   HGB 11.0* 11.3* 10.4* 9.4* 9.0* 9.3*  HCT 31.8* 32.6* 30.1* 27.6* 26.0* 27.6*  MCV 84.4 84.9 85.8 83.9 84.4 86.8  PLT 104* 92* 124* 149* 199 030   Basic Metabolic Panel: Recent Labs  Lab 12/02/20 0327 12/02/20 1309 12/03/20 0755 12/04/20 0428 12/05/20 0440 12/06/20 0436 12/07/20 0522  NA 139   < > 138 138 137 134* 135  K 2.6*   < > 3.4* 3.3* 3.2* 3.7 3.8  CL 100   < > 103 104 101 104 104  CO2 26   < > 29 28 26 25 25   GLUCOSE 171*   < > 191* 110* 101* 102* 103*  BUN 36*   < > 33* 38* 32* 33* 27*  CREATININE 0.76   < > 0.61 0.62 0.72 0.60* 0.54*  CALCIUM 8.1*   < > 7.8* 7.9* 7.6* 7.4* 7.5*  MG 2.3  --   --   --  1.8  --   --    < > = values in this interval not displayed.   GFR: Estimated Creatinine Clearance: 115.4 mL/min (A) (by C-G formula based on SCr of 0.54 mg/dL (L)). Liver Function Tests: Recent Labs  Lab 12/01/20 0157 12/02/20 0327 12/03/20 0755  AST 97* 40 33  ALT 49* 32 27  ALKPHOS 129* 107 111  BILITOT 1.5* 1.4* 1.1  PROT 5.3* 5.2* 5.1*  ALBUMIN 2.2* 1.9* 2.0*   No results for input(s): LIPASE, AMYLASE in the last 168 hours. No results for input(s): AMMONIA in the last 168 hours.  Coagulation Profile: Recent Labs  Lab 12/01/20 0147 12/01/20 1047 12/02/20 0327  INR 1.3* 1.4* 1.2   Cardiac Enzymes: No results for input(s): CKTOTAL, CKMB, CKMBINDEX, TROPONINI in the last 168 hours. BNP (last 3 results) No results for input(s): PROBNP in the last 8760 hours. HbA1C: No results for input(s): HGBA1C in the last 72  hours.  CBG: Recent Labs  Lab 12/01/20 1131  GLUCAP 97   Lipid Profile: No results for input(s): CHOL, HDL, LDLCALC, TRIG, CHOLHDL, LDLDIRECT in the last 72 hours.  Thyroid Function Tests: No results for input(s): TSH, T4TOTAL, FREET4, T3FREE, THYROIDAB in the last 72 hours.  Anemia Panel: No results for input(s): VITAMINB12, FOLATE, FERRITIN, TIBC, IRON, RETICCTPCT in the last 72 hours. Sepsis Labs: Recent Labs  Lab 11/30/20 2237 12/01/20 0147 12/01/20 0157 12/06/20 0436 12/07/20 0522  PROCALCITON  --   --  22.89 2.23 1.18  LATICACIDVEN 1.7 1.9  --   --   --     Recent Results (from the past 240 hour(s))  Blood Culture (routine x 2)     Status: Abnormal   Collection Time: 11/30/20  2:54 PM   Specimen: BLOOD  Result Value Ref Range Status   Specimen Description   Final    BLOOD LEFT ANTECUBITAL Performed at [facility], [address] [place], [place] [address]    Special Requests   Final    BOTTLES DRAWN AEROBIC AND ANAEROBIC Blood Culture results may not be optimal due to an excessive volume of blood received in culture bottles Performed at [facility], [address], [place],  [address]    Culture  Setup Time   Final    Organism ID to follow [place]  CRITICAL RESULT CALLED TO, READ BACK BY AND VERIFIED WITH: NATHAN BELUE @0011  ON 12/01/20 SKL    Culture METHICILLIN RESISTANT STAPHYLOCOCCUS AUREUS (A)  Final   Report Status 12/03/2020 FINAL  Final   Organism ID, Bacteria METHICILLIN RESISTANT STAPHYLOCOCCUS AUREUS  Final      Susceptibility   Methicillin resistant staphylococcus aureus - MIC*    CIPROFLOXACIN >=8 RESISTANT Resistant     ERYTHROMYCIN >=8 RESISTANT Resistant     GENTAMICIN <=0.5 SENSITIVE Sensitive     OXACILLIN >=4 RESISTANT Resistant     TETRACYCLINE <=1 SENSITIVE Sensitive     VANCOMYCIN 1 SENSITIVE Sensitive     TRIMETH/SULFA <=10 SENSITIVE Sensitive      CLINDAMYCIN <=0.25 SENSITIVE Sensitive     RIFAMPIN <=0.5 SENSITIVE Sensitive     Inducible Clindamycin NEGATIVE Sensitive     * METHICILLIN RESISTANT STAPHYLOCOCCUS AUREUS  Blood Culture (routine x 2)     Status: Abnormal   Collection Time: 11/30/20  2:54 PM   Specimen: BLOOD  Result Value Ref Range Status   Specimen Description   Final    BLOOD BLOOD RIGHT HAND Performed at [facility], [address] [place], [place] [address]    Special Requests   Final    BOTTLES DRAWN AEROBIC AND ANAEROBIC Blood Culture adequate volume Performed at [facility], [address] [place], [place] [address]    Culture  Setup Time   Final    GRAM POSITIVE COCCI IN BOTH AEROBIC AND ANAEROBIC BOTTLES CRITICAL VALUE NOTED.  VALUE IS CONSISTENT WITH PREVIOUSLY REPORTED AND CALLED VALUE.    Culture (A)  Final    STAPHYLOCOCCUS AUREUS SUSCEPTIBILITIES PERFORMED ON PREVIOUS CULTURE WITHIN THE LAST 5 DAYS. Performed at [facility], [place] [address] [place],  [address]    Report Status 12/03/2020 FINAL  Final  Resp Panel by RT-PCR (Flu A&B, Covid) Nasopharyngeal Swab     Status: None   Collection Time: 11/30/20  2:54 PM   Specimen: Nasopharyngeal Swab; Nasopharyngeal(NP) swabs in vial transport medium  Result Value Ref Range Status   SARS Coronavirus 2 by RT PCR NEGATIVE NEGATIVE Final    Comment: (NOTE) SARS-CoV-2 target nucleic acids are NOT DETECTED.  The SARS-CoV-2 RNA is generally detectable in upper respiratory specimens during the acute phase of infection. The lowest concentration of SARS-CoV-2 viral copies this assay can detect is 138 copies/mL. A negative result does not preclude SARS-Cov-2 infection and should not be used as the sole basis for treatment or other patient management decisions. A negative result may occur with  improper specimen collection/handling, submission of specimen other than nasopharyngeal swab, presence of viral mutation(s) within  the areas targeted by this assay, and inadequate number of viral copies(<138 copies/mL). A negative result must be combined with clinical observations, patient history, and epidemiological information. The expected result is Negative.  Fact Sheet for Patients:  EntrepreneurPulse.com.au  Fact Sheet for Healthcare Providers:  IncredibleEmployment.be  This test is no t yet approved or cleared by the [place] FDA and  has been authorized for detection and/or diagnosis of SARS-CoV-2 by FDA under an Emergency Use Authorization (EUA). This EUA will remain  in effect (meaning this test can be used) for the duration of the COVID-19 declaration under Section 564(b)(1) of the Act, 21 U.S.C.section 360bbb-3(b)(1), unless the authorization is terminated  or revoked sooner.       Influenza A by PCR NEGATIVE NEGATIVE Final   Influenza B by PCR NEGATIVE NEGATIVE Final  Comment: (NOTE) The Xpert Xpress SARS-CoV-2/FLU/RSV plus assay is intended as an aid in the diagnosis of influenza from Nasopharyngeal swab specimens and should not be used as a sole basis for treatment. Nasal washings and aspirates are unacceptable for Xpert Xpress SARS-CoV-2/FLU/RSV testing.  Fact Sheet for Patients: EntrepreneurPulse.com.au  Fact Sheet for Healthcare Providers: IncredibleEmployment.be  This test is not yet approved or cleared by the [place] FDA and has been authorized for detection and/or diagnosis of SARS-CoV-2 by FDA under an Emergency Use Authorization (EUA). This EUA will remain in effect (meaning this test can be used) for the duration of the COVID-19 declaration under Section 564(b)(1) of the Act, 21 U.S.C. section 360bbb-3(b)(1), unless the authorization is terminated or revoked.  Performed at [facility], [address] [place], [place] [address]   Blood Culture ID Panel (Reflexed)     Status:  Abnormal   Collection Time: 11/30/20  2:54 PM  Result Value Ref Range Status   Enterococcus faecalis NOT DETECTED NOT DETECTED Final   Enterococcus Faecium NOT DETECTED NOT DETECTED Final   Listeria monocytogenes NOT DETECTED NOT DETECTED Final   Staphylococcus species DETECTED (A) NOT DETECTED Final    Comment: CRITICAL RESULT CALLED TO, READ BACK BY AND VERIFIED WITH: NATHAN BELUE @0011  ON 12/01/20 SKL    Staphylococcus aureus (BCID) DETECTED (A) NOT DETECTED Final    Comment: Methicillin (oxacillin)-resistant Staphylococcus aureus (MRSA). MRSA is predictably resistant to beta-lactam antibiotics (except ceftaroline). Preferred therapy is vancomycin unless clinically contraindicated. Patient requires contact precautions if  hospitalized. CRITICAL RESULT CALLED TO, READ BACK BY AND VERIFIED WITH: NATHAN BELUE @0011  ON 12/01/20 SKL    Staphylococcus epidermidis NOT DETECTED NOT DETECTED Final   Staphylococcus lugdunensis NOT DETECTED NOT DETECTED Final   Streptococcus species NOT DETECTED NOT DETECTED Final   Streptococcus agalactiae NOT DETECTED NOT DETECTED Final   Streptococcus pneumoniae NOT DETECTED NOT DETECTED Final   Streptococcus pyogenes NOT DETECTED NOT DETECTED Final   A.calcoaceticus-baumannii NOT DETECTED NOT DETECTED Final   Bacteroides fragilis NOT DETECTED NOT DETECTED Final   Enterobacterales NOT DETECTED NOT DETECTED Final   Enterobacter cloacae complex NOT DETECTED NOT DETECTED Final   Escherichia coli NOT DETECTED NOT DETECTED Final   Klebsiella aerogenes NOT DETECTED NOT DETECTED Final   Klebsiella oxytoca NOT DETECTED NOT DETECTED Final   Klebsiella pneumoniae NOT DETECTED NOT DETECTED Final   Proteus species NOT DETECTED NOT DETECTED Final   Salmonella species NOT DETECTED NOT DETECTED Final   Serratia marcescens NOT DETECTED NOT DETECTED Final   Haemophilus influenzae NOT DETECTED NOT DETECTED Final   Neisseria meningitidis NOT DETECTED NOT DETECTED Final    Pseudomonas aeruginosa NOT DETECTED NOT DETECTED Final   Stenotrophomonas maltophilia NOT DETECTED NOT DETECTED Final   Candida albicans NOT DETECTED NOT DETECTED Final   Candida auris NOT DETECTED NOT DETECTED Final   Candida glabrata NOT DETECTED NOT DETECTED Final   Candida krusei NOT DETECTED NOT DETECTED Final   Candida parapsilosis NOT DETECTED NOT DETECTED Final   Candida tropicalis NOT DETECTED NOT DETECTED Final   Cryptococcus neoformans/gattii NOT DETECTED NOT DETECTED Final   Meth resistant mecA/C and MREJ DETECTED (A) NOT DETECTED Final    Comment: CRITICAL RESULT CALLED TO, READ BACK BY AND VERIFIED WITH: NATHAN BELUE @0011  ON 12/01/20 SKL Performed at [facility], [address] [place], [place] [address]   Urine Culture     Status: None   Collection Time: 12/01/20  6:18 AM   Specimen: Urine,  Clean Catch  Result Value Ref Range Status   Specimen Description   Final    URINE, CLEAN CATCH Performed at [facility], [address] [place], [place] [address]    Special Requests   Final    NONE Performed at [facility], [address] [place], [place] [address]    Culture   Final    NO GROWTH Performed at [facility], [place] [address], [place], [place] [address]    Report Status 12/02/2020 FINAL  Final  MRSA Next Gen by PCR, Nasal     Status: Abnormal   Collection Time: 12/01/20  8:40 AM   Specimen: Nasal Mucosa; Nasal Swab  Result Value Ref Range Status   MRSA by PCR Next Gen DETECTED (A) NOT DETECTED Final    Comment: RESULT CALLED TO, READ BACK BY AND VERIFIED WITH: CHARLIE FLEETWOOD 12/01/2020 1447 JGF Performed at [facility], [place], [place] [address]   Aerobic Culture w Gram Stain (superficial specimen)     Status: None   Collection Time: 12/01/20 12:44 PM   Specimen: Wound  Result Value Ref Range Status   Specimen Description   Final    WOUND Performed at [facility], [address], [place], [place] [address]    Special Requests   Final    NONE Performed at [facility], [address] [place], [place] [address]    Gram Stain   Final    RARE WBC PRESENT, PREDOMINANTLY MONONUCLEAR RARE GRAM POSITIVE COCCI Performed at [facility], [place] [address] [place], [place] [address]    Culture FEW METHICILLIN RESISTANT STAPHYLOCOCCUS AUREUS  Final   Report Status 12/04/2020 FINAL  Final   Organism ID, Bacteria METHICILLIN RESISTANT STAPHYLOCOCCUS AUREUS  Final      Susceptibility   Methicillin resistant staphylococcus aureus - MIC*    CIPROFLOXACIN >=8 RESISTANT Resistant     ERYTHROMYCIN >=8 RESISTANT Resistant     GENTAMICIN <=0.5 SENSITIVE Sensitive     OXACILLIN >=4 RESISTANT Resistant     TETRACYCLINE <=1 SENSITIVE Sensitive     VANCOMYCIN <=0.5 SENSITIVE Sensitive     TRIMETH/SULFA <=10 SENSITIVE Sensitive     CLINDAMYCIN <=0.25 SENSITIVE Sensitive     RIFAMPIN <=0.5 SENSITIVE Sensitive     Inducible Clindamycin NEGATIVE Sensitive     * FEW METHICILLIN RESISTANT STAPHYLOCOCCUS AUREUS  Aerobic/Anaerobic Culture w Gram Stain (surgical/deep wound)     Status: None   Collection Time: 12/01/20  5:55 PM   Specimen: PATH Cytology Misc. fluid; Body Fluid  Result Value Ref Range Status   Specimen Description   Final    WOUND EPIDURALABSCESS Performed at [facility], [address], [place], [place] [address]    Special Requests   Final    NONE Performed at [facility], [address] [place], [place] [address]    Gram Stain   Final    NO SQUAMOUS EPITHELIAL CELLS SEEN FEW WBC SEEN FEW GRAM POSITIVE COCCI    Culture   Final    FEW METHICILLIN RESISTANT STAPHYLOCOCCUS AUREUS NO ANAEROBES ISOLATED Performed at [facility], [place] [address] [place], [place] [address]    Report Status 12/07/2020 FINAL  Final   Organism ID, Bacteria METHICILLIN RESISTANT STAPHYLOCOCCUS AUREUS  Final      Susceptibility    Methicillin resistant staphylococcus aureus - MIC*    CIPROFLOXACIN >=8 RESISTANT Resistant     ERYTHROMYCIN >=8  RESISTANT Resistant     GENTAMICIN <=0.5 SENSITIVE Sensitive     OXACILLIN >=4 RESISTANT Resistant     TETRACYCLINE <=1 SENSITIVE Sensitive     VANCOMYCIN 1 SENSITIVE Sensitive     TRIMETH/SULFA <=10 SENSITIVE Sensitive     CLINDAMYCIN <=0.25 SENSITIVE Sensitive     RIFAMPIN <=0.5 SENSITIVE Sensitive     Inducible Clindamycin NEGATIVE Sensitive     * FEW METHICILLIN RESISTANT STAPHYLOCOCCUS AUREUS  CULTURE, BLOOD (ROUTINE X 2) w Reflex to ID Panel     Status: Abnormal   Collection Time: 12/02/20  7:12 PM   Specimen: BLOOD  Result Value Ref Range Status   Specimen Description   Final    BLOOD LEFT ANTECUBITAL Performed at [facility], [address], [place], [place] [address]    Special Requests   Final    BOTTLES DRAWN AEROBIC AND ANAEROBIC Blood Culture adequate volume Performed at [facility], [address] [place], [place] [address]    Culture  Setup Time   Final    GRAM POSITIVE COCCI ANAEROBIC BOTTLE ONLY CRITICAL VALUE NOTED.  VALUE IS CONSISTENT WITH PREVIOUSLY REPORTED AND CALLED VALUE. Performed at [facility], [address] [place], [place] [address]    Culture (A)  Final    STAPHYLOCOCCUS AUREUS SUSCEPTIBILITIES PERFORMED ON PREVIOUS CULTURE WITHIN THE LAST 5 DAYS. Performed at [facility], [place] [address] [place], [place] [address]    Report Status 12/06/2020 FINAL  Final  CULTURE, BLOOD (ROUTINE X 2) w Reflex to ID Panel     Status: Abnormal   Collection Time: 12/02/20  7:13 PM   Specimen: BLOOD  Result Value Ref Range Status   Specimen Description   Final    BLOOD BLOOD LEFT WRIST Performed at [facility], [address], [place], [place] [address]    Special Requests   Final    BOTTLES DRAWN AEROBIC AND ANAEROBIC Blood Culture adequate volume Performed at [facility], [address] [place], [place] [address]    Culture  Setup Time   Final    GRAM POSITIVE COCCI ANAEROBIC BOTTLE ONLY CRITICAL RESULT CALLED TO, READ BACK BY AND VERIFIED WITH: SHEEMA HALLAJI AT 2030 ON 12/03/20 BY SS    Culture (A)  Final    STAPHYLOCOCCUS AUREUS SUSCEPTIBILITIES PERFORMED ON PREVIOUS CULTURE WITHIN THE LAST 5 DAYS. Performed at [facility], [place] [address], [place], [place] [address]    Report Status 12/05/2020 FINAL  Final  Blood Culture ID Panel (Reflexed)     Status: Abnormal   Collection Time: 12/02/20  7:13 PM  Result Value Ref Range Status   Enterococcus faecalis NOT DETECTED NOT DETECTED Final   Enterococcus Faecium NOT DETECTED NOT DETECTED Final   Listeria monocytogenes NOT DETECTED NOT DETECTED Final   Staphylococcus species DETECTED (A) NOT DETECTED Final    Comment: CRITICAL RESULT CALLED TO, READ BACK BY AND VERIFIED WITH: SHEEMA HALLAJI AT 2030 ON 12/03/20 BY SS    Staphylococcus aureus (BCID) DETECTED (A) NOT DETECTED Final    Comment: Methicillin (oxacillin)-resistant Staphylococcus aureus (MRSA). MRSA is predictably resistant to beta-lactam antibiotics (except ceftaroline). Preferred therapy is vancomycin unless clinically contraindicated. Patient requires contact precautions if  hospitalized. CRITICAL RESULT CALLED TO, READ BACK BY AND VERIFIED WITH: SHEEMA HALLAJI AT 2030 ON 12/03/20 BY SS    Staphylococcus epidermidis NOT DETECTED NOT DETECTED Final   Staphylococcus lugdunensis NOT DETECTED NOT DETECTED Final   Streptococcus species NOT  DETECTED NOT DETECTED Final   Streptococcus agalactiae NOT DETECTED NOT DETECTED Final   Streptococcus pneumoniae NOT DETECTED NOT DETECTED Final   Streptococcus pyogenes NOT DETECTED NOT DETECTED Final   A.calcoaceticus-baumannii NOT DETECTED NOT DETECTED Final   Bacteroides fragilis NOT DETECTED NOT DETECTED Final   Enterobacterales NOT DETECTED NOT DETECTED Final   Enterobacter cloacae complex NOT DETECTED  NOT DETECTED Final   Escherichia coli NOT DETECTED NOT DETECTED Final   Klebsiella aerogenes NOT DETECTED NOT DETECTED Final   Klebsiella oxytoca NOT DETECTED NOT DETECTED Final   Klebsiella pneumoniae NOT DETECTED NOT DETECTED Final   Proteus species NOT DETECTED NOT DETECTED Final   Salmonella species NOT DETECTED NOT DETECTED Final   Serratia marcescens NOT DETECTED NOT DETECTED Final   Haemophilus influenzae NOT DETECTED NOT DETECTED Final   Neisseria meningitidis NOT DETECTED NOT DETECTED Final   Pseudomonas aeruginosa NOT DETECTED NOT DETECTED Final   Stenotrophomonas maltophilia NOT DETECTED NOT DETECTED Final   Candida albicans NOT DETECTED NOT DETECTED Final   Candida auris NOT DETECTED NOT DETECTED Final   Candida glabrata NOT DETECTED NOT DETECTED Final   Candida krusei NOT DETECTED NOT DETECTED Final   Candida parapsilosis NOT DETECTED NOT DETECTED Final   Candida tropicalis NOT DETECTED NOT DETECTED Final   Cryptococcus neoformans/gattii NOT DETECTED NOT DETECTED Final   Meth resistant mecA/C and MREJ DETECTED (A) NOT DETECTED Final    Comment: CRITICAL RESULT CALLED TO, READ BACK BY AND VERIFIED WITH: SHEEMA HALLAJI AT 2030 ON 12/03/20 BY SS Performed at [facility], [address] [place], [place] [address]   CULTURE, BLOOD (ROUTINE X 2) w Reflex to ID Panel     Status: None (Preliminary result)   Collection Time: 12/04/20  4:28 AM   Specimen: BLOOD  Result Value Ref Range Status   Specimen Description BLOOD LAC  Final   Special Requests   Final    BOTTLES DRAWN AEROBIC AND ANAEROBIC Blood Culture adequate volume   Culture   Final    NO GROWTH 3 DAYS Performed at [facility], [address] [place], [place] [address]    Report Status PENDING  Incomplete  CULTURE, BLOOD (ROUTINE X 2) w Reflex to ID Panel     Status: None (Preliminary result)   Collection Time: 12/04/20  4:37 AM   Specimen: BLOOD  Result Value Ref Range Status   Specimen  Description BLOOD LHAND  Final   Special Requests   Final    BOTTLES DRAWN AEROBIC AND ANAEROBIC Blood Culture adequate volume   Culture   Final    NO GROWTH 3 DAYS Performed at [facility], [address] [place], [place] [address]    Report Status PENDING  Incomplete      Imaging Studies   No results found.   Medications   Scheduled Meds:  amLODipine  10 mg Oral Daily   Chlorhexidine Gluconate Cloth  6 each Topical Q0600   enoxaparin (LOVENOX) injection  40 mg Subcutaneous Q24H   feeding supplement  237 mL Oral TID BM   losartan  50 mg Oral Daily   multivitamin with minerals  1 tablet Oral Daily   oxyCODONE  5 mg Oral Q6H   polyethylene glycol  17 g Oral Daily   senna-docusate  1 tablet Oral BID   tamsulosin  0.4 mg Oral QPC breakfast   Continuous Infusions:  ceFTAROline (TEFLARO) IV 600 mg (12/07/20 1513)   vancomycin  LOS: 7 days    Time spent: 25 minutes with greater than 50% at bedside in coordination of care    Ezekiel Slocumb, DO [facility]  12/07/2020, 4:44 PM      If 7PM-7AM, please contact night-coverage. How to contact the [facility] Attending or Consulting provider [place] or covering provider during after hours [place], for this patient?    Check the care team in [facility] and look for a) attending/consulting [facility] provider listed and b) the [facility] team listed Log into [email or web address] and use [place]'s universal password to access. If you do not have the password, please contact the hospital operator. Locate the [facility] provider you are looking for under [facility] and page to a number that you can be directly reached. If you still have difficulty reaching the provider, please page the [facility] (Director on Call) for the Hospitalists listed on amion for assistance.

## 2020-12-07 NOTE — Progress Notes (Signed)
Pharmacy Antibiotic Note  Caleb Fisher is a 45 y.o. 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  Pharmacy has been consulted for Vancomycin dosing.  Patient presented with severe neck pain and confusion. No apparent PMH other than chronic back pain. MRI visualized cervical abscess of neck and spine at C2-C5. CT with bilateral pulmonary nodules, likely septic emboli.   Added antistaphylococcal beta lactam, ceftaroline, while awaiting MIC of vancomycin d/t concern for high bioburden. S/p C2-4 right hemilaminectomies for evacuation of epidural on 12/01/20. ECHO negative for vegetations.  Levels from 11/20: vancomycin dose given 11/19 @2200  11/20 Vanc peak 35 (@0109  1 hour post-dose) 11/20 Vanc trough 12 (@0938  prior to next dose)   Renal function improved Scr < 0.8 and at BL. Has remained afebrile. WBC slightly increased today from 17.5>20. As patient will require IV antibiotics at discharge utilizing trough dosing, plan is to increase dose in accordance with goal trough of 15-20 mcg/mL. Once therapeutic trough, ID may decide to de-escalate ceftaroline to vancomycin alone, pending clinical status.   Plan: Increase vancomycin 1000 mg every 8 hours Goal AUC 400-600 Calculated AUC 584.2 Cmin 15 Obtain levels around 4th-5th dose.   Also on ceftaroline 600 mg every 8 hours as above.  Monitor clinical course per ID, renal function, LOT.  Expecting IV antibiotic duration of 6 weeks.  Height: 5\' 9"  (175.3 cm) Weight: 73.4 kg (161 lb 13.1 oz) IBW/kg (Calculated) : 70.7  Temp (24hrs), Avg:98.9 F (37.2 C), Min:98.3 F (36.8 C), Max:99.5 F (37.5 C)  Recent Labs  Lab 11/30/20 1643 11/30/20 2237 12/01/20 0030 12/01/20 0147 12/01/20 0157 12/02/20 0327 12/02/20 1912 12/02/20 2245 12/03/20 0755 12/04/20 0428 12/05/20 0109 12/05/20 0440 12/05/20 0938 12/06/20 0436 12/07/20 0522  WBC  --   --    < >  --    < > 15.2*  --   --   --  20.8*  --   22.9*  --  17.8* 20.2*  CREATININE  --   --   --   --    < > 0.76   < >  --  0.61 0.62  --  0.72  --  0.60* 0.54*  LATICACIDVEN 2.2* 1.7  --  1.9  --   --   --   --   --   --   --   --   --   --   --   VANCOTROUGH  --   --   --   --   --   --   --   --  8*  --   --   --  12*  --   --   VANCOPEAK  --   --   --   --   --   --   --  19*  --   --  35  --   --   --   --    < > = values in this interval not displayed.     Estimated Creatinine Clearance: 115.4 mL/min (A) (by C-G formula based on SCr of 0.54 mg/dL (L)).    No Known Allergies  Antimicrobials this admission: 11/15 Cefepime >> x 2 doses 11/15 Flagyl >>  11/15 Vancomycin >>  Microbiology results: 11/15 BCx: 4 of 4 bottle w/ MRSA 11/15 CSFCx: Pending  11/16 Epidural absces: few GPCs 11/16 wound: few GPCs 11/17 Bcx: NG < 12 hours   Thank you for allowing pharmacy to be a part of  this patient's care.    Wynelle [place], PharmD Pharmacy Resident  12/07/2020 4:00 PM

## 2020-12-07 NOTE — Progress Notes (Signed)
Date of Admission:  11/30/2020     ID: Caleb Fisher is a 45 y.o. male  Active Problems:   Encephalopathy acute   Altered mental status   MRSA bacteremia   Abscess in epidural space of cervical spine   Hypertensive urgency   Hypokalemia   Thrombocytopenia (HCC)   Acute metabolic encephalopathy   Severe sepsis (HCC)    Subjective: Pt is in bed Says bad night because of neck pin Participated with PT and walked in the corridor No fever Had urinary retention yesterday and got foley   Medications:   amLODipine  10 mg Oral Daily   Chlorhexidine Gluconate Cloth  6 each Topical Q0600   enoxaparin (LOVENOX) injection  40 mg Subcutaneous Q24H   feeding supplement  237 mL Oral TID BM   losartan  50 mg Oral Daily   multivitamin with minerals  1 tablet Oral Daily   oxyCODONE  5 mg Oral Q6H   polyethylene glycol  17 g Oral Daily   senna-docusate  1 tablet Oral BID   tamsulosin  0.4 mg Oral QPC breakfast    Objective: Vital signs in last 24 hours: Temp:  [98.3 F (36.8 C)-99.5 F (37.5 C)] 98.3 F (36.8 C) (11/22 0400) Pulse Rate:  [66-89] 66 (11/22 0515) Resp:  [16-23] 17 (11/22 0515) BP: (135-165)/(85-101) 150/85 (11/22 0515) SpO2:  [93 %-97 %] 96 % (11/22 0515)  PHYSICAL EXAM:  General: Alert, cooperative, no distress at rest .  Head: Normocephalic, without obvious abnormality, atraumatic. Eyes: Conjunctivae clear, anicteric sclerae. Pupils are equal ENT Nares normal. No drainage or sinus tenderness. Lips, mucosa, and tongue normal. No Thrush Nape of the neck surgical site is healthy no carotid bruit and no JVD. Back: No CVA tenderness. Lungs: b/l air entry- crepts bases Heart: s1s2. Abdomen: Soft, non-tender,not distended. Bowel sounds normal. No masses Foley in place Extremities: Excoriated lesions forearms much better. Skin: As above Lymph: Cervical, supraclavicular normal. Neurologic: Grossly non-focal  Lab Results Recent Labs    12/06/20 0436  12/07/20 0522  WBC 17.8* 20.2*  HGB 9.0* 9.3*  HCT 26.0* 27.6*  NA 134* 135  K 3.7 3.8  CL 104 104  CO2 25 25  BUN 33* 27*  CREATININE 0.60* 0.54*   Liver Panel No results for input(s): PROT, ALBUMIN, AST, ALT, ALKPHOS, BILITOT, BILIDIR, IBILI in the last 72 hours.  Microbiology: 11/30/2020 blood culture 4 out of 4 MRSA MRSA nares positive Wound culture of the right forearm sent on 12/01/2020: Staph aureus 12/01/2020 epidural abscess culture: Staph aureus  12/02/2020 blood culture: Gram-positive cocci 12/04/20- BC- NG   Assessment/Plan:  MRSA bacteremia with disseminated infection causing epidural abscess and septic emboli in both lungs. Patient is on vancomycin and ceftaroline.   Repeat blood culture from 11/17  1 out of 2 sets positive.    repeat cultures from 11/19- neg so far.  Patient will need minimum 6 weeks of IV antibiotic. 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 PICC line cannot be placed until negative blood culture.  Cervical epidural abscess- C2-C5- underwent right hemilaminectomy C2-C4 washout of the abscess.  Cultures have been sent on the results of gram-positive cocci.  Bilateral pulmonary nodules.  Likely septic emboli.  2D echo done TEE not done as patient will need 6 weeks of IV antibiotic for spine infection  Thrombocytopenia.  Likely related to infection has resolved.  Encephalopathy has resolved  AKI  has resolved.  Leucocytosis- could be from urinary retention/ atelectasis' Incentive spirometry   Discussed the management with the patient and his wife

## 2020-12-08 ENCOUNTER — Inpatient Hospital Stay: Payer: BC Managed Care – PPO

## 2020-12-08 DIAGNOSIS — R7989 Other specified abnormal findings of blood chemistry: Secondary | ICD-10-CM

## 2020-12-08 DIAGNOSIS — W19XXXA Unspecified fall, initial encounter: Secondary | ICD-10-CM

## 2020-12-08 DIAGNOSIS — L02511 Cutaneous abscess of right hand: Secondary | ICD-10-CM | POA: Diagnosis not present

## 2020-12-08 DIAGNOSIS — J9602 Acute respiratory failure with hypercapnia: Secondary | ICD-10-CM

## 2020-12-08 DIAGNOSIS — D696 Thrombocytopenia, unspecified: Secondary | ICD-10-CM

## 2020-12-08 LAB — HEPATIC FUNCTION PANEL
ALT: 38 U/L (ref 0–44)
AST: 38 U/L (ref 15–41)
Albumin: 1.7 g/dL — ABNORMAL LOW (ref 3.5–5.0)
Alkaline Phosphatase: 152 U/L — ABNORMAL HIGH (ref 38–126)
Bilirubin, Direct: 0.6 mg/dL — ABNORMAL HIGH (ref 0.0–0.2)
Indirect Bilirubin: 0.7 mg/dL (ref 0.3–0.9)
Total Bilirubin: 1.3 mg/dL — ABNORMAL HIGH (ref 0.3–1.2)
Total Protein: 5 g/dL — ABNORMAL LOW (ref 6.5–8.1)

## 2020-12-08 LAB — CBC
HCT: 25.3 % — ABNORMAL LOW (ref 39.0–52.0)
Hemoglobin: 8.5 g/dL — ABNORMAL LOW (ref 13.0–17.0)
MCH: 29 pg (ref 26.0–34.0)
MCHC: 33.6 g/dL (ref 30.0–36.0)
MCV: 86.3 fL (ref 80.0–100.0)
Platelets: 367 10*3/uL (ref 150–400)
RBC: 2.93 MIL/uL — ABNORMAL LOW (ref 4.22–5.81)
RDW: 15.7 % — ABNORMAL HIGH (ref 11.5–15.5)
WBC: 21 10*3/uL — ABNORMAL HIGH (ref 4.0–10.5)
nRBC: 0 % (ref 0.0–0.2)

## 2020-12-08 LAB — BASIC METABOLIC PANEL
Anion gap: 5 (ref 5–15)
BUN: 17 mg/dL (ref 6–20)
CO2: 25 mmol/L (ref 22–32)
Calcium: 7.3 mg/dL — ABNORMAL LOW (ref 8.9–10.3)
Chloride: 102 mmol/L (ref 98–111)
Creatinine, Ser: 0.62 mg/dL (ref 0.61–1.24)
GFR, Estimated: >60 mL/min (ref >60)
Glucose, Bld: 101 mg/dL — ABNORMAL HIGH (ref 70–99)
Potassium: 3.5 mmol/L (ref 3.5–5.1)
Sodium: 132 mmol/L — ABNORMAL LOW (ref 135–145)

## 2020-12-08 LAB — MAGNESIUM: Magnesium: 2.2 mg/dL (ref 1.7–2.4)

## 2020-12-08 LAB — ALDOSTERONE + RENIN ACTIVITY W/ RATIO
ALDO / PRA Ratio: <0.4 (ref 0.0–30.0)
Aldosterone: <1 ng/dL (ref 0.0–30.0)
PRA LC/MS/MS: 2.658 ng/mL/hr (ref 0.167–5.380)

## 2020-12-08 MED ORDER — LIDOCAINE HCL (PF) 1 % IJ SOLN: 5.0000 mL | Freq: Once | INTRAMUSCULAR | Status: DC

## 2020-12-08 MED ORDER — LIDOCAINE HCL 1 % IJ SOLN
5.0000 mL | Freq: Once | INTRAMUSCULAR | Status: AC
Start: 2020-12-08 — End: 2020-12-08
  Administered 2020-12-08: 5 mL via INTRADERMAL
  Filled 2020-12-08: qty 10

## 2020-12-08 MED ORDER — GADOBUTROL 1 MMOL/ML IV SOLN
7.0000 mL | Freq: Once | INTRAVENOUS | Status: AC | PRN
  Administered 2020-12-08: 7 mL via INTRAVENOUS

## 2020-12-08 MED ORDER — TAMSULOSIN HCL 0.4 MG PO CAPS
0.8000 mg | ORAL_CAPSULE | Freq: Every day | ORAL | Status: DC
  Administered 2020-12-09 – 2020-12-17 (×8): 0.8 mg via ORAL
  Filled 2020-12-08 (×9): qty 2

## 2020-12-08 NOTE — Progress Notes (Signed)
   Date of Admission:  11/30/2020   T  ID: Caleb Fisher is a 45 y.o. male  Active Problems:   Encephalopathy acute   Altered mental status   MRSA bacteremia   Abscess in epidural space of cervical spine   Hypertensive urgency   Hypokalemia   Thrombocytopenia (HCC)   Acute metabolic encephalopathy   Severe sepsis (HCC)   Acute urinary retention    Subjective: C/o of back pain- all over spine Has an abscess rt little finger-painful Low grade fever Foley removed- voiding trial-    Medications:   amLODipine  10 mg Oral Daily   Chlorhexidine Gluconate Cloth  6 each Topical Q0600   enoxaparin (LOVENOX) injection  40 mg Subcutaneous Q24H   feeding supplement  237 mL Oral TID BM   losartan  50 mg Oral Daily   multivitamin with minerals  1 tablet Oral Daily   oxyCODONE  5 mg Oral Q6H   polyethylene glycol  17 g Oral Daily   senna-docusate  1 tablet Oral BID   tamsulosin  0.4 mg Oral QPC breakfast    Objective: Vital signs in last 24 hours: Temp:  [98.2 F (36.8 C)-98.8 F (37.1 C)] 98.8 F (37.1 C) (11/23 0500) Pulse Rate:  [67-84] 84 (11/23 0800) Resp:  [19-27] 25 (11/23 0800) BP: (125-171)/(73-107) 171/92 (11/23 0800) SpO2:  [78 %-96 %] 93 % (11/23 0800)  PHYSICAL EXAM:  General: awake but lethargic cooperative, some distress on making him stand or lean forward with help of his wife  appears stated age.  Head: Normocephalic, without obvious abnormality, atraumatic. Eyes: Conjunctivae clear, anicteric sclerae. Pupils are equal ENT Nares normal. No drainage or sinus tenderness. Lips, mucosa, and tongue normal. No Thrush Neck: , symmetrical, no adenopathy, thyroid: non tender Cervical surgical site clean, no discharge no carotid bruit and no JVD. Back:some tenderness on palpation mid back Lungs: b/l air entry- decreased air entry bases  Heart:tachycardia Abdomen: Soft, non-tender,not distended. Bowel sounds normal. No masses Extremities: excoriations forearms  healed Rt 5th finger abscess     Skin: No rashes or lesions. Or bruising Lymph: Cervical, supraclavicular normal. Neurologic: Grossly non-focal  Lab Results Recent Labs    12/07/20 0522 12/08/20 0238  WBC 20.2* 21.0*  HGB 9.3* 8.5*  HCT 27.6* 25.3*  NA 135 132*  K 3.8 3.5  CL 104 102  CO2 25 25  BUN 27* 17  CREATININE 0.54* 0.62  Microbiology: 11/30/2020 blood culture 4 out of 4 MRSA MRSA nares positive Wound culture of the right forearm sent on 12/01/2020: Staph aureus 12/01/2020 epidural abscess culture: Staph aureus  12/02/2020 blood culture: Gram-positive cocci 12/04/20- BC- NG   Assessment/Plan: MRSA bacteremia causing disseminated infection Repeat blood culture from 11/19 Ng Epidural abscess C2-C5 s/p hemilaminectomy and washout   Persistent leuccoytosis-low grade fever and a new abscess rt 5 th finger Needs I/D TEE is needed  Imaging of entire spine as he has pain extending caudally down the spine   Bilateral pulmonary nodules on CT- likely septic emboli   AKI has resolved Pt has urinary retention- needed foley, then it was removed- voiding trial- also on tamsulosin  Constipated, which could contribute to his retention  Discussed the management with the patient , his wife and care team

## 2020-12-08 NOTE — Progress Notes (Signed)
OT Cancellation Note  Patient Details Name: Mazin Emma MRN: [id number] DOB: [birth year]   Cancelled Treatment:    Reason Eval/Treat Not Completed: Other (comment) OT checked on pt twice this afternoon for tx. On first attempt, RN addressing R UE abscess and on second attempt, RN preparing to transfer pt to the floor. Will f/u at later date/time as able/pt available. Thank you.  Gerrianne Scale, [place], OTR/L ascom [phone] 12/08/20, 4:39 PM

## 2020-12-08 NOTE — Progress Notes (Addendum)
Right pinky finger abcess drained by Richrd Sox., PA this evening. Culture sent to lab.     Post foley removal, pt voided 50 ml. Bladder scan done = 400 ml. Dr. Ouida Sills notified. Per MD, place foley if Bladder scan > 500 ml.   Pt transferred to 135A near 1800 in wheelchair.   Pt remains on room air. Taking PO well.

## 2020-12-08 NOTE — Progress Notes (Signed)
PROGRESS NOTE  Caleb Fisher    DOB: [birth year], 45 y.o.  HMC:[id number]  PCP: Pcp, No   Code Status: Full Code   DOA: 11/30/2020   LOS: 8  Brief Narrative of Current Hospitalization  Caleb Fisher is a 45 y.o. male with a PMH significant for significant tobacco use. They presented from home to the ED on 11/30/2020 with several days of altered mental status and increased back/neck pain. In the ED, it was found that they had sepsis from unknown source. They were treated with IV fluids and antibiotics.  Neurology was consulted for LP.  Subsequently, patient was found to have MRSA bacteremia.  Imaging revealed cervical epidural abscesses involving C2-C5. Patient was admitted to medicine service for further workup and management of sepsis as outlined in detail below.  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 Sepsis physiology resolved. Wound cultures positive for MRSA sensitive to vancomycin. Repeat blood cultures collected 11/19 NGTD.  WBC remains elevated- 21.0 today.  -ID following, appreciate recommendations  - PICC line placement  - currently on vancomycin and ceftaroline. Will need 6 weeks total Abx.    - (11/15-12/27) -Neurosurgery following, appreciate recommendations  - s/p hemilaminectomy with washout 11/16 - PT/OT  Thrombocytopenia- resolved.   AKI- resolved.  - avoid nephrotoxic agents  Urinary retention - remove foley today and monitor for urinary retention. If unable to void,  replace foley which will remain in for discharge and follow up with urology outpatient.  - strict I/O - bladder scans q4hr  DVT prophylaxis: enoxaparin (LOVENOX) injection 40 mg Start: 12/02/20 2200 SCD's Start: 12/01/20 1907   Diet:  Diet Orders (From admission, onward)     Start     Ordered   12/02/20 1505  Diet regular Room service appropriate? Yes; Fluid consistency: Thin  Diet effective now       Question Answer Comment  Room service appropriate? Yes   Fluid consistency: Thin      12/02/20 1504            Subjective 12/08/20    Pt reports feeling fine. He has no complaints today. I described the plan for him today and he expressed understanding and agreement.   Disposition Plan & Communication  Patient status: Inpatient  Admitted From: Home Disposition: Home Anticipated discharge date: 11/25  Family Communication: none at bedside  Consults, Procedures, Significant Events  Consultants:  Neurosurgery ID Neurology   Procedures/significant events:  hemilaminectomy with washout 11/16  Antimicrobials:  Anti-infectives (From admission, onward)    Start     Dose/Rate Route Frequency Ordered Stop   12/07/20 1800  vancomycin (VANCOREADY) IVPB 1000 mg/200 mL        1,000 mg 200 mL/hr over 60 Minutes Intravenous Every 8 hours 12/07/20 1630     12/03/20 1000  vancomycin (VANCOREADY) IVPB 1000 mg/200 mL  Status:  Discontinued        1,000 mg 200 mL/hr over 60 Minutes Intravenous Every 12 hours 12/03/20 0850 12/03/20 0850   12/03/20 1000  vancomycin (VANCOREADY) IVPB 1500 mg/300  mL  Status:  Discontinued        1,500 mg 150 mL/hr over 120 Minutes Intravenous Every 12 hours 12/03/20 0850 12/07/20 1630   12/01/20 1630  ceftaroline (TEFLARO) 600 mg in sodium chloride 0.9 % 100 mL IVPB        600 mg 100 mL/hr over 60 Minutes Intravenous Every 8 hours 12/01/20 1533     12/01/20 0800  cefTRIAXone (ROCEPHIN) 2 g in sodium chloride 0.9 % 100 mL IVPB  Status:  Discontinued         2 g 200 mL/hr over 30 Minutes Intravenous Every 24 hours 11/30/20 2311 12/01/20 0032   12/01/20 0700  vancomycin (VANCOREADY) IVPB 1000 mg/200 mL  Status:  Discontinued        1,000 mg 200 mL/hr over 60 Minutes Intravenous Every 12 hours 11/30/20 2319 12/03/20 0850   12/01/20 0700  ceFEPIme (MAXIPIME) 2 g in sodium chloride 0.9 % 100 mL IVPB  Status:  Discontinued        2 g 200 mL/hr over 30 Minutes Intravenous Every 8 hours 12/01/20 0054 12/01/20 0128   12/01/20 0618  metroNIDAZOLE (FLAGYL) IVPB 500 mg  Status:  Discontinued        500 mg 100 mL/hr over 60 Minutes Intravenous Every 6 hours 12/01/20 0031 12/01/20 1237   12/01/20 0600  cefTRIAXone (ROCEPHIN) 2 g in sodium chloride 0.9 % 100 mL IVPB  Status:  Discontinued        2 g 200 mL/hr over 30 Minutes Intravenous Every 12 hours 12/01/20 0035 12/01/20 0049   12/01/20 0045  cefTRIAXone (ROCEPHIN) 2 g in sodium chloride 0.9 % 100 mL IVPB  Status:  Discontinued        2 g 200 mL/hr over 30 Minutes Intravenous Every 12 hours 12/01/20 0032 12/01/20 0035   12/01/20 0000  metroNIDAZOLE (FLAGYL) IVPB 500 mg  Status:  Discontinued        500 mg 100 mL/hr over 60 Minutes Intravenous Every 12 hours 11/30/20 2311 12/01/20 0031   11/30/20 2300  ceFEPIme (MAXIPIME) 2 g in sodium chloride 0.9 % 100 mL IVPB  Status:  Discontinued        2 g 200 mL/hr over 30 Minutes Intravenous Every 8 hours 11/30/20 1816 12/01/20 0000   11/30/20 1930  acyclovir (ZOVIRAX) 725 mg in dextrose 5 % 150 mL IVPB  Status:  Discontinued        10 mg/kg  72.6 kg 164.5 mL/hr over 60 Minutes Intravenous Every 8 hours 11/30/20 1915 12/01/20 0032   11/30/20 1500  vancomycin (VANCOREADY) IVPB 1750 mg/350 mL        1,750 mg 175 mL/hr over 120 Minutes Intravenous  Once 11/30/20 1443 11/30/20 2200   11/30/20 1445  ceFEPIme (MAXIPIME) 2 g in sodium chloride 0.9 % 100 mL IVPB        2 g 200 mL/hr over 30 Minutes Intravenous  Once 11/30/20 1436 11/30/20 1728   11/30/20 1445   metroNIDAZOLE (FLAGYL) IVPB 500 mg        500 mg 100 mL/hr over 60 Minutes Intravenous  Once 11/30/20 1436 11/30/20 1835   11/30/20 1445  vancomycin (VANCOCIN) IVPB 1000 mg/200 mL premix  Status:  Discontinued        1,000 mg 200 mL/hr over 60 Minutes Intravenous  Once 11/30/20 1436 11/30/20 1443       Objective   Vitals:   12/08/20 0300 12/08/20 0400 12/08/20 0500 12/08/20 0600  BP: (!) 160/90 Marland Kitchen)  130/102 (!) 162/97 (!) 151/85  Pulse: 71 73 75 77  Resp: (!) 22 (!) 21 20 (!) 22  Temp:   98.8 F (37.1 C)   TempSrc:   Oral   SpO2: 94% 94% 92% 95%  Weight:      Height:        Intake/Output Summary (Last 24 hours) at 12/08/2020 0729 Last data filed at 12/08/2020 0517 Gross per 24 hour  Intake 880 ml  Output 1875 ml  Net -995 ml   Filed Weights   11/30/20 1311 11/30/20 2216  Weight: 72.6 kg 73.4 kg    Patient BMI: Body mass index is 23.9 kg/m.   Physical Exam: General: awake, alert, NAD HEENT: atraumatic, clear conjunctiva, anicteric sclera, moist mucus membranes, hearing grossly normal Respiratory: normal respiratory effort. Cardiovascular: quick capillary refill  Nervous: A&O x3. no gross focal neurologic deficits, normal speech Extremities: moves all equally, no edema, normal tone Skin: dry, intact, normal temperature, normal color, No rashes, lesions or ulcers on exposed skin Psychiatry: flat mood and affect  Labs   CBC    Component Value Date/Time   WBC 21.0 (H) 12/08/2020 0238   RBC 2.93 (L) 12/08/2020 0238   HGB 8.5 (L) 12/08/2020 0238   HCT 25.3 (L) 12/08/2020 0238   PLT 367 12/08/2020 0238   MCV 86.3 12/08/2020 0238   MCH 29.0 12/08/2020 0238   MCHC 33.6 12/08/2020 0238   RDW 15.7 (H) 12/08/2020 0238   LYMPHSABS 2.0 12/06/2020 0436   MONOABS 1.3 (H) 12/06/2020 0436   EOSABS 0.0 12/06/2020 0436   BASOSABS 0.0 12/06/2020 0436   BMP Latest Ref Rng & Units 12/08/2020 12/07/2020 12/06/2020  Glucose 70 - 99 mg/dL 101(H) 103(H) 102(H)  BUN 6 - 20 mg/dL  17 27(H) 33(H)  Creatinine 0.61 - 1.24 mg/dL 0.62 0.54(L) 0.60(L)  Sodium 135 - 145 mmol/L 132(L) 135 134(L)  Potassium 3.5 - 5.1 mmol/L 3.5 3.8 3.7  Chloride 98 - 111 mmol/L 102 104 104  CO2 22 - 32 mmol/L 25 25 25   Calcium 8.9 - 10.3 mg/dL 7.3(L) 7.5(L) 7.4(L)   Imaging Studies  No results found. Medications   Scheduled Meds:  amLODipine  10 mg Oral Daily   Chlorhexidine Gluconate Cloth  6 each Topical Q0600   enoxaparin (LOVENOX) injection  40 mg Subcutaneous Q24H   feeding supplement  237 mL Oral TID BM   losartan  50 mg Oral Daily   multivitamin with minerals  1 tablet Oral Daily   oxyCODONE  5 mg Oral Q6H   polyethylene glycol  17 g Oral Daily   senna-docusate  1 tablet Oral BID   tamsulosin  0.4 mg Oral QPC breakfast   No recently discontinued medications to reconcile  LOS: 8 days   Time spent: >28min  Haden Suder L Rayhan Groleau, DO [facility] 12/08/2020, 7:29 AM   Please refer to amion to contact the [facility] Attending or Consulting provider for this pt  [email or web address] Available by Epic secure chat 7AM-7PM. If 7PM-7AM, please contact night-coverage

## 2020-12-08 NOTE — Procedures (Signed)
Procedure Note  Date: 12/08/20 3:12 PM  Preforming Provider: Edison Simon, PA-C  Pre-Procedure Diagnosis: Distal Right 5th Finger Abscess  Post-Procedure Diagnosis: Same  Anesthesia: 2 ccs of 1% lidocaine without epinephrine  Findings: copious amount of purulence  Details of Procedure:  All risks, benefits, and alternatives to above procedure(s) were discussed with the patient and informed consent was obtained. The patient's distal right fifth finger was prepped and draped in standard sterile fashion. 2 ccs of 1% lidocaine without epinephrine with injected intradermally and adequate anesthesia achieved. Using an 11 blade scalpel, a small incision was made central over area of fluctuance and a copious amount of purulence was expressed. I did culture this. The wound was then dressed. The patient tolerated this well without immediate complications. All sharps were disposed of at the end of the procedure.  Complications: None apparent  --  Edison Simon, PA-C [facility] 12/08/2020, 3:12 PM [phone] M-F: 7am - 4pm

## 2020-12-08 NOTE — Consult Note (Signed)
[facility] SURGICAL CONSULTATION NOTE (initial) - cpt: 28768   HISTORY OF PRESENT ILLNESS (HPI):  45 y.o. male presented to [facility] ED initially on 11/15 secondary to AMS. He was ultimately found to have a cervical epidural abscess and underwent posterior hemilaminectomy on R from C2 to C4 for epidural abscess drainage with Dr Cari Caraway on 11/16. He has been admitted in the ICU since. Initial BCx grew staph species and repeat grew the same. Cx from the OR grew MRSA. Third set of BCx on 11/19 were without growth. Currently on Ceftaroline and Vancomycin. Infectious disease is on board. Patient's significant other at bedside notes that around 24 hours ago, he noticed some erythema and "tightness" to the distal portion of his right fifth finger on the fat pad. There is an area centrally that appears fluctuant. He did have a fever this morning to 100.81F. Still with leukocytosis to 21.0K. Mild hyponatremia to 132. Renal function normal with sCr - 0.62.   Surgery is consulted by hospitalist physician Dr. Ermalene Searing, MD in this context for evaluation and management of distal right 5th finger abscess.  PAST MEDICAL HISTORY (PMH):  History reviewed. No pertinent past medical history.   PAST SURGICAL HISTORY ([place]):  Past Surgical History:  Procedure Laterality Date   POSTERIOR CERVICAL LAMINECTOMY FOR EPIDURAL ABSCESS N/A 12/01/2020   Procedure: POSTERIOR CERVICAL LAMINECTOMY FOR EPIDURAL ABSCESS C2-C4;  Surgeon: Meade Maw, MD;  Location: [facility] ORS;  Service: Neurosurgery;  Laterality: N/A;     MEDICATIONS:  Prior to Admission medications   Medication Sig Start Date End Date Taking? Authorizing Provider  Aspirin-Salicylamide-Caffeine (BC HEADACHE POWDER PO) Take by mouth.   Yes [provider]  ibuprofen (ADVIL) 800 MG tablet Take 800 mg by mouth every 8 (eight) hours as needed.   Yes [provider]     ALLERGIES:  No Known Allergies   SOCIAL  HISTORY:  Social History   Socioeconomic History   Marital status: Married    Spouse name: Not on file   Number of children: Not on file   Years of education: Not on file   Highest education level: Not on file  Occupational History   Not on file  Tobacco Use   Smoking status: Not on file   Smokeless tobacco: Not on file  Substance and Sexual Activity   Alcohol use: Yes   Drug use: Not on file   Sexual activity: Not on file  Other Topics Concern   Not on file  Social History Narrative   Not on file   Social Determinants of Health   Financial Resource Strain: Not on file  Food Insecurity: Not on file  Transportation Needs: Not on file  Physical Activity: Not on file  Stress: Not on file  Social Connections: Not on file  Intimate Partner Violence: Not on file     FAMILY HISTORY:  No family history on file.    REVIEW OF SYSTEMS:  Review of Systems  Constitutional:  Positive for fever. Negative for chills.  Respiratory:  Negative for cough and shortness of breath.   Cardiovascular:  Negative for chest pain and palpitations.  Gastrointestinal:  Negative for diarrhea, nausea and vomiting.  Genitourinary:  Negative for dysuria and urgency.  Skin:        + Distal Right Finger Abscess  All other systems reviewed and are negative.  VITAL SIGNS:  Temp:  [98.2 F (36.8 C)-100.8 F (38.2 C)] 100.8 F (38.2 C) (11/23 1100) Pulse Rate:  [42-91]  83 (11/23 1500) Resp:  [19-27] 20 (11/23 1500) BP: (110-175)/(73-107) 110/80 (11/23 1500) SpO2:  [45 %-96 %] 95 % (11/23 1500)     Height: 5\' 9"  (175.3 cm) Weight: 73.4 kg     INTAKE/OUTPUT:  11/22 0701 - 11/23 0700 In: 880 [P.O.:480; IV Piggyback:400] Out: 1875 [Urine:1875]  PHYSICAL EXAM:  Physical Exam Vitals and nursing note reviewed. Exam conducted with a chaperone present.  Constitutional:      General: He is not in acute distress.    Appearance: He is normal weight. He is not ill-appearing.  HENT:     Head:  Normocephalic and atraumatic.  Pulmonary:     Effort: Pulmonary effort is normal. No respiratory distress.  Genitourinary:    Comments: Deferred Skin:    General: Skin is warm and dry.     Findings: Erythema present.     Comments: To the distal right fifth finger there is an area of erythema and tenderness to the fat bad with a central area of fluctuance and what appears to be underlying purulence. No evidence of necrosis.  Neurological:     General: No focal deficit present.     Mental Status: He is alert.  Psychiatric:        Mood and Affect: Mood normal.        Behavior: Behavior normal.     Distal Right 5th finger (12/08/2020):    Labs:  CBC Latest Ref Rng & Units 12/08/2020 12/07/2020 12/06/2020  WBC 4.0 - 10.5 K/uL 21.0(H) 20.2(H) 17.8(H)  Hemoglobin 13.0 - 17.0 g/dL 8.5(L) 9.3(L) 9.0(L)  Hematocrit 39.0 - 52.0 % 25.3(L) 27.6(L) 26.0(L)  Platelets 150 - 400 K/uL 367 284 199   CMP Latest Ref Rng & Units 12/08/2020 12/07/2020 12/06/2020  Glucose 70 - 99 mg/dL 101(H) 103(H) 102(H)  BUN 6 - 20 mg/dL 17 27(H) 33(H)  Creatinine 0.61 - 1.24 mg/dL 0.62 0.54(L) 0.60(L)  Sodium 135 - 145 mmol/L 132(L) 135 134(L)  Potassium 3.5 - 5.1 mmol/L 3.5 3.8 3.7  Chloride 98 - 111 mmol/L 102 104 104  CO2 22 - 32 mmol/L 25 25 25   Calcium 8.9 - 10.3 mg/dL 7.3(L) 7.5(L) 7.4(L)  Total Protein 6.5 - 8.1 g/dL 5.0(L) - -  Total Bilirubin 0.3 - 1.2 mg/dL 1.3(H) - -  Alkaline Phos 38 - 126 U/L 152(H) - -  AST 15 - 41 U/L 38 - -  ALT 0 - 44 U/L 38 - -    Imaging studies:  No pertinent imaging studies   Assessment/Plan: (ICD-10's: L02.511) 45 y.o. male with continued fever and leukocytosis admitted with sepsis presumably secondary to cervical epidural abscess s/p drainage on 11/17 incidentally found to have what appears to be very small (<2 cm) abscess to the distal right fifth finger on the fat pad.   - Will preform I&D of his small right distal fifth finger abscess. I will document the  procedure separately.    - All risks, benefits, and alternatives to above procedure(s) were discussed with the patient and family at bedside, all of their questions were answered to their expressed satisfaction, patient expresses he wishes to proceed, and informed consent was obtained.  - I will attempt to Cx this as well; although I suspect the amount may be too small   - Wound Care: Cover daily with dry gauze and secure; anticipate this will only need a few days to heal  - Further management per primary service; we will be available if needed   All of  the above findings and recommendations were discussed with the patient and his family, and all of patient's family's questions were answered to their expressed satisfaction.  Thank you for the opportunity to participate in this patient's care.   -- Edison Simon, PA-C [facility] 12/08/2020, 3:12 PM [phone] M-F: 7am - 4pm

## 2020-12-08 NOTE — Progress Notes (Signed)
Physical Therapy Treatment Patient Details Name: Caleb Fisher MRN: [id number] DOB: 1975-05-24 Today's Date: 12/08/2020   History of Present Illness 45 year old male with no past medical history other than tobacco abuse, presented to the ED from home on 11/30/2020 with altered mental status, increased back pain and new onset neck pain, and frequent falls. MRI of the spine shows epidural cervical abscess around C2-C5.  Pt underwent C2-4 right hemilaminectomies for evacuation of epidural on 12/01/20.    PT Comments    Patient received in bed sleeping. RN approved PT visit. Patient is groggy, states "I need to" when asked about getting up and walking. He is confused. Not sure where he is, or how long he's been here. Required mod assist for supine to sit. Min/mod for sit to stand and to take a few steps from bed to recliner. Due to patient's increased confusion, increased temp, declined ambulating further at this time for safety. Patient will continue to benefit from skilled PT while here to improve functional mobility, safety and independence for return to PFL.     Recommendations for follow up therapy are one component of a multi-disciplinary discharge planning process, led by the attending physician.  Recommendations may be updated based on patient status, additional functional criteria and insurance authorization.  Follow Up Recommendations  Home health PT     Assistance Recommended at Discharge Frequent or constant Supervision/Assistance  Equipment Recommendations  Rolling walker (2 wheels)    Recommendations for Other Services       Precautions / Restrictions Precautions Precautions: Fall Restrictions Weight Bearing Restrictions: No Other Position/Activity Restrictions: s/p C2-4 right hemilaminectomies - per MD notes no activity restrictions     Mobility  Bed Mobility Overal bed mobility: Needs Assistance Bed Mobility: Supine to Sit     Supine to sit: Mod assist;HOB elevated      General bed mobility comments: Mod assist to raise up to sitting at edge of bed.    Transfers Overall transfer level: Needs assistance Equipment used: 1 person hand held assist Transfers: Sit to/from Stand Sit to Stand: Min assist                Ambulation/Gait Ambulation/Gait assistance: Min assist Gait Distance (Feet): 4 Feet Assistive device: 1 person hand held assist Gait Pattern/deviations: Step-to pattern;Decreased step length - right;Decreased step length - left Gait velocity: decreased     General Gait Details: Able to take a few steps from bed to recliner only. Min assist.   Stairs             Wheelchair Mobility    Modified Rankin (Stroke Patients Only)       Balance Overall balance assessment: Needs assistance Sitting-balance support: Feet supported Sitting balance-Leahy Scale: Fair Sitting balance - Comments: UE support   Standing balance support: Single extremity supported;During functional activity Standing balance-Leahy Scale: Fair                              Cognition Arousal/Alertness: Lethargic Behavior During Therapy: WFL for tasks assessed/performed Overall Cognitive Status: Impaired/Different from baseline Area of Impairment: Attention;Following commands;Safety/judgement;Awareness;Orientation                 Orientation Level: Disoriented to;Place;Time;Situation Current Attention Level: Sustained   Following Commands: Follows one step commands with increased time Safety/Judgement: Decreased awareness of safety;Decreased awareness of deficits Awareness: Intellectual   General Comments: Patient drowsy, confused, slow to respond. Asking if I was talking to  him ( no one else was in the room)        Exercises      General Comments        Pertinent Vitals/Pain Pain Assessment: Faces Faces Pain Scale: Hurts even more Pain Descriptors / Indicators: Discomfort;Grimacing;Guarding;Sore Pain  Intervention(s): Monitored during session;Limited activity within patient's tolerance;Repositioned    Home Living                          Prior Function            PT Goals (current goals can now be found in the care plan section) Acute Rehab PT Goals Patient Stated Goal: to get back to normal PT Goal Formulation: With patient Time For Goal Achievement: 12/17/20 Potential to Achieve Goals: Fair Progress towards PT goals: Not progressing toward goals - comment (Patient with increased temp, RR rate and confusion this session)    Frequency    Min 2X/week      PT Plan Current plan remains appropriate    Co-evaluation              AM-PAC PT "6 Clicks" Mobility   Outcome Measure  Help needed turning from your back to your side while in a flat bed without using bedrails?: A Lot Help needed moving from lying on your back to sitting on the side of a flat bed without using bedrails?: A Lot Help needed moving to and from a bed to a chair (including a wheelchair)?: A Lot Help needed standing up from a chair using your arms (e.g., wheelchair or bedside chair)?: A Lot Help needed to walk in hospital room?: A Lot Help needed climbing 3-5 steps with a railing? : A Lot 6 Click Score: 12    End of Session   Activity Tolerance: Patient limited by fatigue;Patient limited by lethargy;Patient limited by pain Patient left: in chair;with family/visitor present Nurse Communication: Mobility status PT Visit Diagnosis: Unsteadiness on feet (R26.81);Other abnormalities of gait and mobility (R26.89);Muscle weakness (generalized) (M62.81);Repeated falls (R29.6);Difficulty in walking, not elsewhere classified (R26.2)     Time: 3790-2409 PT Time Calculation (min) (ACUTE ONLY): 19 min  Charges:  $Therapeutic Activity: 8-22 mins                     Theone Bowell, PT, GCS 12/08/20,2:41 PM

## 2020-12-08 NOTE — Plan of Care (Signed)

## 2020-12-09 DIAGNOSIS — R7401 Elevation of levels of liver transaminase levels: Secondary | ICD-10-CM

## 2020-12-09 DIAGNOSIS — R338 Other retention of urine: Secondary | ICD-10-CM

## 2020-12-09 DIAGNOSIS — J9601 Acute respiratory failure with hypoxia: Secondary | ICD-10-CM

## 2020-12-09 DIAGNOSIS — G062 Extradural and subdural abscess, unspecified: Secondary | ICD-10-CM

## 2020-12-09 DIAGNOSIS — L02511 Cutaneous abscess of right hand: Secondary | ICD-10-CM

## 2020-12-09 LAB — CBC
HCT: 24.3 % — ABNORMAL LOW (ref 39.0–52.0)
Hemoglobin: 8.4 g/dL — ABNORMAL LOW (ref 13.0–17.0)
MCH: 30.2 pg (ref 26.0–34.0)
MCHC: 34.6 g/dL (ref 30.0–36.0)
MCV: 87.4 fL (ref 80.0–100.0)
Platelets: 514 10*3/uL — ABNORMAL HIGH (ref 150–400)
RBC: 2.78 MIL/uL — ABNORMAL LOW (ref 4.22–5.81)
RDW: 15.5 % (ref 11.5–15.5)
WBC: 20.1 10*3/uL — ABNORMAL HIGH (ref 4.0–10.5)
nRBC: 0 % (ref 0.0–0.2)

## 2020-12-09 LAB — BASIC METABOLIC PANEL
Anion gap: 4 — ABNORMAL LOW (ref 5–15)
BUN: 20 mg/dL (ref 6–20)
CO2: 25 mmol/L (ref 22–32)
Calcium: 7.3 mg/dL — ABNORMAL LOW (ref 8.9–10.3)
Chloride: 103 mmol/L (ref 98–111)
Creatinine, Ser: 0.59 mg/dL — ABNORMAL LOW (ref 0.61–1.24)
GFR, Estimated: >60 mL/min (ref >60)
Glucose, Bld: 90 mg/dL (ref 70–99)
Potassium: 3.3 mmol/L — ABNORMAL LOW (ref 3.5–5.1)
Sodium: 132 mmol/L — ABNORMAL LOW (ref 135–145)

## 2020-12-09 LAB — CULTURE, BLOOD (ROUTINE X 2)
Culture: NO GROWTH
Culture: NO GROWTH
Special Requests: ADEQUATE
Special Requests: ADEQUATE

## 2020-12-09 LAB — VANCOMYCIN, TROUGH: Vancomycin Tr: 16 ug/mL (ref 15–20)

## 2020-12-09 LAB — VANCOMYCIN, PEAK: Vancomycin Pk: 39 ug/mL (ref 30–40)

## 2020-12-09 MED ORDER — POTASSIUM CHLORIDE CRYS ER 20 MEQ PO TBCR
40.0000 meq | EXTENDED_RELEASE_TABLET | Freq: Two times a day (BID) | ORAL | Status: AC
  Administered 2020-12-09 (×2): 40 meq via ORAL
  Filled 2020-12-09 (×2): qty 2

## 2020-12-09 NOTE — Progress Notes (Signed)
Pharmacy Antibiotic Note  Caleb Fisher is a 45 y.o. 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  Pharmacy has been consulted for Vancomycin dosing.  Added antistaphylococcal beta lactam, ceftaroline, while awaiting MIC of vancomycin d/t concern for high bioburden. S/p C2-4 right hemilaminectomies for evacuation of epidural on 12/01/20. ECHO negative for vegetations.   Renal function improved Scr < 0.8 and at BL. Has remained afebrile. WBC slightly increased today from 17.5>20. As patient will require IV antibiotics at discharge utilizing trough dosing, plan is to increase dose in accordance with goal trough of 15-20 mcg/mL. Once therapeutic trough, ID may decide to de-escalate ceftaroline to vancomycin alone, pending clinical status.   Plan: Vancomycin level obtained Vanc peak 39 (drawn early), and vanc trough 16 (RN started vancomycin but immediately stopped it). Will continue vancomycin 1000 mg q8H to maintain a trough of > 15. Predicted AUC of 587.   Also on ceftaroline 600 mg every 8 hours as above.  Monitor clinical course per ID, renal function, LOT.  Expecting IV antibiotic duration of 6 weeks.  Height: 5\' 9"  (175.3 cm) Weight: 73.4 kg (161 lb 13.1 oz) IBW/kg (Calculated) : 70.7  Temp (24hrs), Avg:98.6 F (37 C), Min:98.4 F (36.9 C), Max:99.1 F (37.3 C)  Recent Labs  Lab 12/05/20 0109 12/05/20 0440 12/05/20 0938 12/06/20 0436 12/07/20 0522 12/08/20 0238 12/09/20 0255 12/09/20 0944  WBC  --  22.9*  --  17.8* 20.2* 21.0* 20.1*  --   CREATININE  --  0.72  --  0.60* 0.54* 0.62 0.59*  --   VANCOTROUGH  --   --  12*  --   --   --   --  16  VANCOPEAK 35  --   --   --   --   --  39  --      Estimated Creatinine Clearance: 115.4 mL/min (A) (by C-G formula based on SCr of 0.59 mg/dL (L)).    No Known Allergies  Antimicrobials this admission: 11/15 Cefepime >> x 2 doses 11/15 Flagyl >>  11/15 Vancomycin >>  Microbiology  results: 11/15 BCx: 4 of 4 bottle w/ MRSA 11/15 CSFCx: Pending  11/16 Epidural absces: few GPCs 11/16 wound: few GPCs 11/17 Bcx: NG < 12 hours   Thank you for allowing pharmacy to be a part of this patient's care.    Oswald Hillock, PharmD 12/09/2020 11:12 AM

## 2020-12-09 NOTE — Progress Notes (Signed)
CC: finger abscess Subjective: Doing much better , minimal pain. No organism on gram stain.  Objective: Vital signs in last 24 hours: Temp:  [98.1 F (36.7 C)-99.1 F (37.3 C)] 98.1 F (36.7 C) (11/24 1136) Pulse Rate:  [66-91] 71 (11/24 1136) Resp:  [13-24] 16 (11/24 1136) BP: (110-162)/(74-92) 125/76 (11/24 1136) SpO2:  [91 %-99 %] 95 % (11/24 1136) Last BM Date: 12/09/20  Intake/Output from previous day: 11/23 0701 - 11/24 0700 In: -  Out: 2075 [Urine:2075] Intake/Output this shift: Total I/O In: 350 [P.O.:350] Out: 300 [Urine:300]  Physical exam:  NAD alert Right 5th finger w puncture wound healing well, no infection. No necrotizing infection. No neurological or motor deficits on right hand  Lab Results: CBC  Recent Labs    12/08/20 0238 12/09/20 0255  WBC 21.0* 20.1*  HGB 8.5* 8.4*  HCT 25.3* 24.3*  PLT 367 514*   BMET Recent Labs    12/08/20 0238 12/09/20 0255  NA 132* 132*  K 3.5 3.3*  CL 102 103  CO2 25 25  GLUCOSE 101* 90  BUN 17 20  CREATININE 0.62 0.59*  CALCIUM 7.3* 7.3*   PT/INR No results for input(s): LABPROT, INR in the last 72 hours. ABG No results for input(s): PHART, HCO3 in the last 72 hours.  Invalid input(s): PCO2, PO2  Studies/Results: MR CERVICAL SPINE W WO CONTRAST  Result Date: 12/08/2020 CLINICAL DATA:  Known cervical epidural abscess, mid and low back pain, suspect worsening infection EXAM: MRI CERVICAL, THORACIC AND LUMBAR SPINE WITHOUT AND WITH CONTRAST TECHNIQUE: Multiplanar and multiecho pulse sequences of the cervical spine, to include the craniocervical junction and cervicothoracic junction, and thoracic and lumbar spine, were obtained without and with intravenous contrast. CONTRAST:  74mL GADAVIST GADOBUTROL 1 MMOL/ML IV SOLN COMPARISON:  11/30/2020 MRI cervical spine, correlation is also made with 11/30/2020 CT cervical, thoracic, and lumbar spine. FINDINGS: MRI CERVICAL SPINE FINDINGS Alignment: Straightening the  normal cervical lordosis. No significant listhesis Vertebrae: Increased T2 signal and enhancement in the C4 vertebral body, which is new from the prior exam, with similar signal at the superior posterior aspect of C5 (series 7, image 10 and series 42, image 9). The endplates appear intact. No abnormal signal in the C4-C5 disc space. Cord: Spinal cord is normal in signal and morphology. No abnormal spinal cord enhancement. Posterior to the spinal cord, there is a circumferential collection the length of the cervical spine, with peripheral contrast enhancement, which measures up to 4 mm at the level of C5 (series 5, image 8), previously 3 mm, and causes moderate thecal sac narrowing. In addition it now measures up to 5 mm at the level C7-T1 (series 5, image 8), previously 3 mm, but without significant thecal sac narrowing. The previously noted greatest dimension the collection at the C2-C3 level now measures 3 mm (series 42, image 8), previously 5 mm when measured similarly. Posterior Fossa, vertebral arteries, paraspinal tissues: Redemonstrated prevertebral effusion, now measuring up to 10 mm at the anterior aspect of C4 (series 7, image 7), previously 7 mm. Status post interval right hemilaminectomy C2-C4 for drainage of the epidural abscess, with a fluid collection in the right paraspinal tissues, measuring up to 1.9 x 2.3 x 5.1 cm in the paraspinous musculature (series 8, image 5 and series 5, image 5), with a connection to a smaller more superficial collection that measures 1.5 x 3.2 x 4.1 cm (series 8, image 1 and series 5, image 8) these collections demonstrate peripheral enhancement but do  not enhance internally. Additional surrounding increased T2 signal and enhancement, likely edema. Disc levels: Mild degenerative changes without osseous spinal canal stenosis. Disc bulge at C5-C6, which in conjunction with the epidural collection, causes moderate thecal sac narrowing. MRI THORACIC SPINE FINDINGS Alignment:   Physiologic. Vertebrae: No acute fracture or suspicious osseous lesion. No abnormal enhancement. T1 and T2 hyperintense lesions, likely benign hemangiomas, most prominent in T1 and T6. Cord: Normal signal and morphology. No abnormal spinal cord enhancement. Circumferential enhancing collection extends from the cervicothoracic junction to approximately the level of T6-T7 (series 41, image 11). This does not cause any significant thecal sac narrowing in the thoracic spine. Paraspinal and other soft tissues: Negative. Disc levels: No significant spinal canal stenosis or neural foraminal narrowing. MRI LUMBAR SPINE FINDINGS Segmentation:  Standard. Alignment:  Physiologic. Vertebrae: Increased T2 signal and diffuse enhancement in the L5 vertebral body and at the superior aspect of S1, with fluid in and enhancement in the disc space (series 33, image 10 and series 38, image 10). Less diffuse increased T2 signal and contrast enhancement in the posterior aspect of L4 (series 38, image 9). Similar abnormal signal is also seen in the spinous processes of L4 and L5. Additional T1 and T2 hyperintense lesions, consistent with benign hemangiomas. Conus medullaris and cauda equina: Conus extends to the L1 level. Conus and cauda equina appear normal. Epidural collection along the dorsal aspect of the thecal sac beginning at the level of L1 and extending through the sacrum. The collection measures up to 11 mm at the level of L2-L3 (series 38, image 10), where it causes severe thecal sac narrowing. The collection is more circumferential from L3-L4 through the sacrum. Paraspinal and other soft tissues: Loculated fluid collection with peripheral enhancement in the right paraspinous musculature (series 36, image 31, series 39, image 31, series 33, image 7 and series 38, image 7), extending from the superior aspect of L3 to the superior aspect of S1 and measuring up to 2.6 x 2.0 x 8.0 cm (series 36, image 30 and series 33, image 7),  with additional enhancement and increased T2 signal, likely edema, extending from L1-L2 to the inferior edge of the field-of-view. Suspect a small subcentimeter psoas abscess on the right at L1 (series 36, image 11), with additional enhancement and increased T2 signal in the medial aspect of the psoas bilaterally, without focal collection. Disc levels: T12-L1: No significant disc bulge. No spinal canal stenosis or neural foraminal narrowing. L1-L2: Dorsal epidural collection measuring up to 8 mm. Mild to moderate thecal sac narrowing. No significant disc bulge. No neural foraminal narrowing. L2-L3: Dorsal epidural collection measuring up to 11 mm, causing severe thecal sac narrowing. No significant disc bulge. No significant neural foraminal narrowing. L3-L4: Dorsal epidural collection measuring to 7 mm. Moderate facet arthropathy. Moderate to severe thecal sac narrowing. No osseous neural foraminal narrowing. L4-L5: More circumferential epidural collection, which causes moderate to severe thecal sac narrowing. Moderate facet arthropathy. Minimal disc bulge. No significant neural foraminal narrowing. L5-S1: Severe disc height loss and left greater than right disc bulge, with left paracentral annular fissure. Circumferential epidural collection, which causes moderate to severe thecal sac narrowing. Mild facet arthropathy. Mild bilateral neural foraminal narrowing. IMPRESSION: CERVICAL SPINE FINDINGS 1. 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 The collection is decreased in size at the C2-C3 level, but increased in size at C5 and C7, with moderate thecal sac narrowing at C5  from the combination of the collection and a small disc bulge. 2. Status post C2-C4 right hemilaminectomy and decompression, with a fluid collection in the paraspinous musculature and superficial soft tissues, as well as surrounding edema, favored  to be a postoperative seroma. 3. Increased T2 signal and enhancement in the C4 vertebral body and superior posterior aspect of C5, which are new from the prior exam. No abnormal disc signal, however this is concerning for developing osteomyelitis. Attention on follow-up. 4. Increased prevertebral edema. 5. No abnormal spinal cord signal. THORACIC SPINE FINDINGS 1. 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 No significant thecal sac narrowing in the thoracic spine. 2. No abnormal vertebral body enhancement or abnormal spinal cord signal. LUMBAR SPINE FINDINGs 1. 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 2. Increased T2 signal and enhancement in L5 and at the superior aspect of S1, with fluid and enhancement in the disc space, concerning for osteomyelitis discitis. Additional abnormal signal in the posterior aspect of L4 may represent early osteomyelitis. Attention on follow-up. 3. Loculated fluid collection with peripheral enhancement in the right paraspinous musculature, most likely abscesses, extending from the level of L3 to S1, with additional surrounding edema. Possible additional subcentimeter psoas abscess on the right. 4. No abnormal spinal cord signal or cauda equina enhancement in the lumbar spine. Electronically Signed   By: Merilyn Baba M.D.   On: 12/08/2020 23:36   MR THORACIC SPINE W WO CONTRAST  Result Date: 12/08/2020 CLINICAL DATA:  Known cervical epidural abscess, mid and low back pain, suspect worsening infection EXAM: MRI CERVICAL, THORACIC AND LUMBAR SPINE WITHOUT AND WITH CONTRAST TECHNIQUE: Multiplanar and multiecho pulse sequences of the cervical spine, to include the craniocervical junction and cervicothoracic junction, and thoracic and  lumbar spine, were obtained without and with intravenous contrast. CONTRAST:  73mL GADAVIST GADOBUTROL 1 MMOL/ML IV SOLN COMPARISON:  11/30/2020 MRI cervical spine, correlation is also made with 11/30/2020 CT cervical, thoracic, and lumbar spine. FINDINGS: MRI CERVICAL SPINE FINDINGS Alignment: Straightening the normal cervical lordosis. No significant listhesis Vertebrae: Increased T2 signal and enhancement in the C4 vertebral body, which is new from the prior exam, with similar signal at the superior posterior aspect of C5 (series 7, image 10 and series 42, image 9). The endplates appear intact. No abnormal signal in the C4-C5 disc space. Cord: Spinal cord is normal in signal and morphology. No abnormal spinal cord enhancement. Posterior to the spinal cord, there is a circumferential collection the length of the cervical spine, with peripheral contrast enhancement, which measures up to 4 mm at the level of C5 (series 5, image 8), previously 3 mm, and causes moderate thecal sac narrowing. In addition it now measures up to 5 mm at the level C7-T1 (series 5, image 8), previously 3 mm, but without significant thecal sac narrowing. The previously noted greatest dimension the collection at the C2-C3 level now measures 3 mm (series 42, image 8), previously 5 mm when measured similarly. Posterior Fossa, vertebral arteries, paraspinal tissues: Redemonstrated prevertebral effusion, now measuring up to 10 mm at the anterior aspect of C4 (series 7, image 7), previously 7 mm. Status post interval right hemilaminectomy C2-C4 for drainage of the epidural abscess, with a fluid collection in the right paraspinal tissues, measuring  up to 1.9 x 2.3 x 5.1 cm in the paraspinous musculature (series 8, image 5 and series 5, image 5), with a connection to a smaller more superficial collection that measures 1.5 x 3.2 x 4.1 cm (series 8, image 1 and series 5, image 8) these collections demonstrate peripheral enhancement but do not enhance  internally. Additional surrounding increased T2 signal and enhancement, likely edema. Disc levels: Mild degenerative changes without osseous spinal canal stenosis. Disc bulge at C5-C6, which in conjunction with the epidural collection, causes moderate thecal sac narrowing. MRI THORACIC SPINE FINDINGS Alignment:  Physiologic. Vertebrae: No acute fracture or suspicious osseous lesion. No abnormal enhancement. T1 and T2 hyperintense lesions, likely benign hemangiomas, most prominent in T1 and T6. Cord: Normal signal and morphology. No abnormal spinal cord enhancement. Circumferential enhancing collection extends from the cervicothoracic junction to approximately the level of T6-T7 (series 41, image 11). This does not cause any significant thecal sac narrowing in the thoracic spine. Paraspinal and other soft tissues: Negative. Disc levels: No significant spinal canal stenosis or neural foraminal narrowing. MRI LUMBAR SPINE FINDINGS Segmentation:  Standard. Alignment:  Physiologic. Vertebrae: Increased T2 signal and diffuse enhancement in the L5 vertebral body and at the superior aspect of S1, with fluid in and enhancement in the disc space (series 33, image 10 and series 38, image 10). Less diffuse increased T2 signal and contrast enhancement in the posterior aspect of L4 (series 38, image 9). Similar abnormal signal is also seen in the spinous processes of L4 and L5. Additional T1 and T2 hyperintense lesions, consistent with benign hemangiomas. Conus medullaris and cauda equina: Conus extends to the L1 level. Conus and cauda equina appear normal. Epidural collection along the dorsal aspect of the thecal sac beginning at the level of L1 and extending through the sacrum. The collection measures up to 11 mm at the level of L2-L3 (series 38, image 10), where it causes severe thecal sac narrowing. The collection is more circumferential from L3-L4 through the sacrum. Paraspinal and other soft tissues: Loculated fluid  collection with peripheral enhancement in the right paraspinous musculature (series 36, image 31, series 39, image 31, series 33, image 7 and series 38, image 7), extending from the superior aspect of L3 to the superior aspect of S1 and measuring up to 2.6 x 2.0 x 8.0 cm (series 36, image 30 and series 33, image 7), with additional enhancement and increased T2 signal, likely edema, extending from L1-L2 to the inferior edge of the field-of-view. Suspect a small subcentimeter psoas abscess on the right at L1 (series 36, image 11), with additional enhancement and increased T2 signal in the medial aspect of the psoas bilaterally, without focal collection. Disc levels: T12-L1: No significant disc bulge. No spinal canal stenosis or neural foraminal narrowing. L1-L2: Dorsal epidural collection measuring up to 8 mm. Mild to moderate thecal sac narrowing. No significant disc bulge. No neural foraminal narrowing. L2-L3: Dorsal epidural collection measuring up to 11 mm, causing severe thecal sac narrowing. No significant disc bulge. No significant neural foraminal narrowing. L3-L4: Dorsal epidural collection measuring to 7 mm. Moderate facet arthropathy. Moderate to severe thecal sac narrowing. No osseous neural foraminal narrowing. L4-L5: More circumferential epidural collection, which causes moderate to severe thecal sac narrowing. Moderate facet arthropathy. Minimal disc bulge. No significant neural foraminal narrowing. L5-S1: Severe disc height loss and left greater than right disc bulge, with left paracentral annular fissure. Circumferential epidural collection, which causes moderate to severe thecal sac narrowing. Mild facet arthropathy. Mild  bilateral neural foraminal narrowing. IMPRESSION: CERVICAL SPINE FINDINGS 1. 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 The collection is decreased in size at the C2-C3 level, but  increased in size at C5 and C7, with moderate thecal sac narrowing at C5 from the combination of the collection and a small disc bulge. 2. Status post C2-C4 right hemilaminectomy and decompression, with a fluid collection in the paraspinous musculature and superficial soft tissues, as well as surrounding edema, favored to be a postoperative seroma. 3. Increased T2 signal and enhancement in the C4 vertebral body and superior posterior aspect of C5, which are new from the prior exam. No abnormal disc signal, however this is concerning for developing osteomyelitis. Attention on follow-up. 4. Increased prevertebral edema. 5. No abnormal spinal cord signal. THORACIC SPINE FINDINGS 1. 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 No significant thecal sac narrowing in the thoracic spine. 2. No abnormal vertebral body enhancement or abnormal spinal cord signal. LUMBAR SPINE FINDINGs 1. 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 2. Increased T2 signal and enhancement in L5 and at the superior aspect of S1, with fluid and enhancement in the disc space, concerning for osteomyelitis discitis. Additional abnormal signal in the posterior aspect of L4 may represent early osteomyelitis. Attention on follow-up. 3. Loculated fluid collection with peripheral enhancement in the right paraspinous musculature, most likely abscesses, extending from the level of L3 to S1, with additional surrounding edema. Possible additional subcentimeter psoas abscess on the right. 4. No abnormal spinal cord signal or cauda equina enhancement in the lumbar spine. Electronically Signed   By: Merilyn Baba M.D.   On: 12/08/2020 23:36   MR Lumbar Spine W Wo Contrast  Result Date: 12/08/2020 CLINICAL DATA:   Known cervical epidural abscess, mid and low back pain, suspect worsening infection EXAM: MRI CERVICAL, THORACIC AND LUMBAR SPINE WITHOUT AND WITH CONTRAST TECHNIQUE: Multiplanar and multiecho pulse sequences of the cervical spine, to include the craniocervical junction and cervicothoracic junction, and thoracic and lumbar spine, were obtained without and with intravenous contrast. CONTRAST:  38mL GADAVIST GADOBUTROL 1 MMOL/ML IV SOLN COMPARISON:  11/30/2020 MRI cervical spine, correlation is also made with 11/30/2020 CT cervical, thoracic, and lumbar spine. FINDINGS: MRI CERVICAL SPINE FINDINGS Alignment: Straightening the normal cervical lordosis. No significant listhesis Vertebrae: Increased T2 signal and enhancement in the C4 vertebral body, which is new from the prior exam, with similar signal at the superior posterior aspect of C5 (series 7, image 10 and series 42, image 9). The endplates appear intact. No abnormal signal in the C4-C5 disc space. Cord: Spinal cord is normal in signal and morphology. No abnormal spinal cord enhancement. Posterior to the spinal cord, there is a circumferential collection the length of the cervical spine, with peripheral contrast enhancement, which measures up to 4 mm at the level of C5 (series 5, image 8), previously 3 mm, and causes moderate thecal sac narrowing. In addition it now measures up to 5 mm at the level C7-T1 (series 5, image 8), previously 3 mm, but without significant thecal sac narrowing. The previously noted greatest dimension the collection at the C2-C3 level now measures 3 mm (series 42, image 8), previously 5  mm when measured similarly. Posterior Fossa, vertebral arteries, paraspinal tissues: Redemonstrated prevertebral effusion, now measuring up to 10 mm at the anterior aspect of C4 (series 7, image 7), previously 7 mm. Status post interval right hemilaminectomy C2-C4 for drainage of the epidural abscess, with a fluid collection in the right paraspinal  tissues, measuring up to 1.9 x 2.3 x 5.1 cm in the paraspinous musculature (series 8, image 5 and series 5, image 5), with a connection to a smaller more superficial collection that measures 1.5 x 3.2 x 4.1 cm (series 8, image 1 and series 5, image 8) these collections demonstrate peripheral enhancement but do not enhance internally. Additional surrounding increased T2 signal and enhancement, likely edema. Disc levels: Mild degenerative changes without osseous spinal canal stenosis. Disc bulge at C5-C6, which in conjunction with the epidural collection, causes moderate thecal sac narrowing. MRI THORACIC SPINE FINDINGS Alignment:  Physiologic. Vertebrae: No acute fracture or suspicious osseous lesion. No abnormal enhancement. T1 and T2 hyperintense lesions, likely benign hemangiomas, most prominent in T1 and T6. Cord: Normal signal and morphology. No abnormal spinal cord enhancement. Circumferential enhancing collection extends from the cervicothoracic junction to approximately the level of T6-T7 (series 41, image 11). This does not cause any significant thecal sac narrowing in the thoracic spine. Paraspinal and other soft tissues: Negative. Disc levels: No significant spinal canal stenosis or neural foraminal narrowing. MRI LUMBAR SPINE FINDINGS Segmentation:  Standard. Alignment:  Physiologic. Vertebrae: Increased T2 signal and diffuse enhancement in the L5 vertebral body and at the superior aspect of S1, with fluid in and enhancement in the disc space (series 33, image 10 and series 38, image 10). Less diffuse increased T2 signal and contrast enhancement in the posterior aspect of L4 (series 38, image 9). Similar abnormal signal is also seen in the spinous processes of L4 and L5. Additional T1 and T2 hyperintense lesions, consistent with benign hemangiomas. Conus medullaris and cauda equina: Conus extends to the L1 level. Conus and cauda equina appear normal. Epidural collection along the dorsal aspect of the  thecal sac beginning at the level of L1 and extending through the sacrum. The collection measures up to 11 mm at the level of L2-L3 (series 38, image 10), where it causes severe thecal sac narrowing. The collection is more circumferential from L3-L4 through the sacrum. Paraspinal and other soft tissues: Loculated fluid collection with peripheral enhancement in the right paraspinous musculature (series 36, image 31, series 39, image 31, series 33, image 7 and series 38, image 7), extending from the superior aspect of L3 to the superior aspect of S1 and measuring up to 2.6 x 2.0 x 8.0 cm (series 36, image 30 and series 33, image 7), with additional enhancement and increased T2 signal, likely edema, extending from L1-L2 to the inferior edge of the field-of-view. Suspect a small subcentimeter psoas abscess on the right at L1 (series 36, image 11), with additional enhancement and increased T2 signal in the medial aspect of the psoas bilaterally, without focal collection. Disc levels: T12-L1: No significant disc bulge. No spinal canal stenosis or neural foraminal narrowing. L1-L2: Dorsal epidural collection measuring up to 8 mm. Mild to moderate thecal sac narrowing. No significant disc bulge. No neural foraminal narrowing. L2-L3: Dorsal epidural collection measuring up to 11 mm, causing severe thecal sac narrowing. No significant disc bulge. No significant neural foraminal narrowing. L3-L4: Dorsal epidural collection measuring to 7 mm. Moderate facet arthropathy. Moderate to severe thecal sac narrowing. No osseous neural foraminal narrowing. L4-L5: More  circumferential epidural collection, which causes moderate to severe thecal sac narrowing. Moderate facet arthropathy. Minimal disc bulge. No significant neural foraminal narrowing. L5-S1: Severe disc height loss and left greater than right disc bulge, with left paracentral annular fissure. Circumferential epidural collection, which causes moderate to severe thecal sac  narrowing. Mild facet arthropathy. Mild bilateral neural foraminal narrowing. IMPRESSION: CERVICAL SPINE FINDINGS 1. 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 The collection is decreased in size at the C2-C3 level, but increased in size at C5 and C7, with moderate thecal sac narrowing at C5 from the combination of the collection and a small disc bulge. 2. Status post C2-C4 right hemilaminectomy and decompression, with a fluid collection in the paraspinous musculature and superficial soft tissues, as well as surrounding edema, favored to be a postoperative seroma. 3. Increased T2 signal and enhancement in the C4 vertebral body and superior posterior aspect of C5, which are new from the prior exam. No abnormal disc signal, however this is concerning for developing osteomyelitis. Attention on follow-up. 4. Increased prevertebral edema. 5. No abnormal spinal cord signal. THORACIC SPINE FINDINGS 1. 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 No significant thecal sac narrowing in the thoracic spine. 2. No abnormal vertebral body enhancement or abnormal spinal cord signal. LUMBAR SPINE FINDINGs 1. 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 2. Increased T2 signal and enhancement in L5 and at the superior aspect of S1, with fluid and enhancement in the disc space, concerning for osteomyelitis discitis. Additional abnormal signal in the posterior aspect of L4 may represent early osteomyelitis. Attention on follow-up. 3. Loculated fluid collection with peripheral enhancement in the right paraspinous musculature, most likely abscesses, extending from the level of L3 to S1, with  additional surrounding edema. Possible additional subcentimeter psoas abscess on the right. 4. No abnormal spinal cord signal or cauda equina enhancement in the lumbar spine. Electronically Signed   By: Merilyn Baba M.D.   On: 12/08/2020 23:36    Anti-infectives: Anti-infectives (From admission, onward)    Start     Dose/Rate Route Frequency Ordered Stop   12/07/20 1800  vancomycin (VANCOREADY) IVPB 1000 mg/200 mL        1,000 mg 200 mL/hr over 60 Minutes Intravenous Every 8 hours 12/07/20 1630     12/03/20 1000  vancomycin (VANCOREADY) IVPB 1000 mg/200 mL  Status:  Discontinued        1,000 mg 200 mL/hr over 60 Minutes Intravenous Every 12 hours 12/03/20 0850 12/03/20 0850   12/03/20 1000  vancomycin (VANCOREADY) IVPB 1500 mg/300 mL  Status:  Discontinued        1,500 mg 150 mL/hr over 120 Minutes Intravenous Every 12 hours 12/03/20 0850 12/07/20 1630   12/01/20 1630  ceftaroline (TEFLARO) 600 mg in sodium chloride 0.9 % 100 mL IVPB        600 mg 100 mL/hr over 60 Minutes Intravenous Every 8 hours 12/01/20 1533     12/01/20 0800  cefTRIAXone (ROCEPHIN) 2 g in sodium chloride 0.9 % 100 mL IVPB  Status:  Discontinued        2 g 200 mL/hr over 30 Minutes Intravenous Every 24 hours 11/30/20 2311 12/01/20  0032   12/01/20 0700  vancomycin (VANCOREADY) IVPB 1000 mg/200 mL  Status:  Discontinued        1,000 mg 200 mL/hr over 60 Minutes Intravenous Every 12 hours 11/30/20 2319 12/03/20 0850   12/01/20 0700  ceFEPIme (MAXIPIME) 2 g in sodium chloride 0.9 % 100 mL IVPB  Status:  Discontinued        2 g 200 mL/hr over 30 Minutes Intravenous Every 8 hours 12/01/20 0054 12/01/20 0128   12/01/20 0618  metroNIDAZOLE (FLAGYL) IVPB 500 mg  Status:  Discontinued        500 mg 100 mL/hr over 60 Minutes Intravenous Every 6 hours 12/01/20 0031 12/01/20 1237   12/01/20 0600  cefTRIAXone (ROCEPHIN) 2 g in sodium chloride 0.9 % 100 mL IVPB  Status:  Discontinued        2 g 200 mL/hr over 30 Minutes  Intravenous Every 12 hours 12/01/20 0035 12/01/20 0049   12/01/20 0045  cefTRIAXone (ROCEPHIN) 2 g in sodium chloride 0.9 % 100 mL IVPB  Status:  Discontinued        2 g 200 mL/hr over 30 Minutes Intravenous Every 12 hours 12/01/20 0032 12/01/20 0035   12/01/20 0000  metroNIDAZOLE (FLAGYL) IVPB 500 mg  Status:  Discontinued        500 mg 100 mL/hr over 60 Minutes Intravenous Every 12 hours 11/30/20 2311 12/01/20 0031   11/30/20 2300  ceFEPIme (MAXIPIME) 2 g in sodium chloride 0.9 % 100 mL IVPB  Status:  Discontinued        2 g 200 mL/hr over 30 Minutes Intravenous Every 8 hours 11/30/20 1816 12/01/20 0000   11/30/20 1930  acyclovir (ZOVIRAX) 725 mg in dextrose 5 % 150 mL IVPB  Status:  Discontinued        10 mg/kg  72.6 kg 164.5 mL/hr over 60 Minutes Intravenous Every 8 hours 11/30/20 1915 12/01/20 0032   11/30/20 1500  vancomycin (VANCOREADY) IVPB 1750 mg/350 mL        1,750 mg 175 mL/hr over 120 Minutes Intravenous  Once 11/30/20 1443 11/30/20 2200   11/30/20 1445  ceFEPIme (MAXIPIME) 2 g in sodium chloride 0.9 % 100 mL IVPB        2 g 200 mL/hr over 30 Minutes Intravenous  Once 11/30/20 1436 11/30/20 1728   11/30/20 1445  metroNIDAZOLE (FLAGYL) IVPB 500 mg        500 mg 100 mL/hr over 60 Minutes Intravenous  Once 11/30/20 1436 11/30/20 1835   11/30/20 1445  vancomycin (VANCOCIN) IVPB 1000 mg/200 mL premix  Status:  Discontinued        1,000 mg 200 mL/hr over 60 Minutes Intravenous  Once 11/30/20 1436 11/30/20 1443       Assessment/Plan:  Right 5th finger abscess, doing well Follow cultures May do daily bandaid We will be available D/W pt and wife in detail I spent > 25 minutes in this encounter including coordination of care, counseling and performing appropriate documentation.   Caroleen Hamman, MD, [facility]  12/09/2020

## 2020-12-09 NOTE — TOC Progression Note (Addendum)
Transition of Care [facility]) - Progression Note    Patient Details  Name: Caleb Fisher MRN: [id number] Date of Birth: 09-10-1975  Transition of Care [facility]) CM/SW [place], RN Phone Number: 12/09/2020, 9:11 AM  Clinical Narrative:    The patient is from home with his wife. He will need 6 weeks of IV ABX and the end date is 12/27 I notified Carolynn Sayers with [facility]. I reached out to [facility] and left a Message for [facility] requesting them to accept he patient for PT, OT and RN, Awaiting a response.  Update, he stated due to staffing they cant accept  I reached out to [facility], [facility], [facility], [facility], [facility], and [facility] and am awaiting a response  [facility] is unable to accept the patient [facility] is unable to accept the patient [facility] is unable to accept the patient [facility] is checking and will let me know  He has no equipment at home  [facility] to follow for DME needs and to follow up on [facility] will accept for Nursing, Reached out to [facility] [place] to inquire if they can accept for PT, awaiting a response       Expected Discharge Plan and Services                                                 Social Determinants of Health (SDOH) Interventions    Readmission Risk Interventions No flowsheet data found.

## 2020-12-09 NOTE — Progress Notes (Signed)
PROGRESS NOTE  Caleb Fisher    DOB: 03-Mar-1975, 45 y.o.  KZS:[id number]  PCP: Pcp, No   Code Status: Full Code   DOA: 11/30/2020   LOS: 9  Brief Narrative of Current Hospitalization  Caleb Fisher is a 45 y.o. male with a PMH significant for significant tobacco use. They presented from home to the ED on 11/30/2020 with several days of altered mental status and increased back/neck pain. In the ED, it was found that they had sepsis from unknown source. They were treated with IV fluids and antibiotics.  Neurology was consulted for LP.  Subsequently, patient was found to have MRSA bacteremia.  Imaging revealed cervical epidural abscesses involving C2-C5. Patient was admitted to medicine service for further workup and management of sepsis as outlined in detail below.  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 Sepsis physiology resolved. Wound cultures positive for MRSA sensitive to vancomycin. Presumed source is excoriations on forearms. Repeat blood cultures collected 11/19 NGTD.  WBC remains elevated- 21.0 today.  He was febrile to 100.8 overnight.  Repeat MRI of spine 11/23 showed: epidural abscess in lumbar spine, possible early osteomyelitis. Epidural abscess of thoracic spine. Possible osteomyelitis in cervical spine, new from previous imaging.  -ID following, appreciate recommendations  - PICC line placement delayed since febrile.  Will need repeat Bcx after surgical clean out and culture.  - currently on vancomycin and ceftaroline. Will need 6 weeks total Abx.    - (11/15-12/27) -Neurosurgery following, appreciate recommendations  - s/p hemilaminectomy with washout 11/16  - reconsulted to clean out lumbar spine - consult cardiology for TEE when able to tolerate procedure - PT/OT  Mild hypokalemia- K+ 3.3 - replete PRN - BMP am  Thrombocytopenia- resolved.   AKI- resolved.  - avoid nephrotoxic agents  Urinary retention- foley removed 11/23 for voiding trial.  - continue increased dose of flomax  - strict I/O - bladder scans q4hr  DVT prophylaxis: enoxaparin (LOVENOX) injection 40 mg Start: 12/02/20 2200 SCD's Start: 12/01/20 1907   Diet:  Diet Orders (From admission, onward)     Start     Ordered   12/02/20 1505  Diet regular Room service appropriate? Yes; Fluid consistency: Thin  Diet effective now       Question Answer Comment  Room service appropriate? Yes   Fluid consistency: Thin      12/02/20 1504            Subjective 12/09/20    Pt reports feeling stable. Continues to have back pain and stiffness. Had a fever overnight.   Disposition Plan & Communication  Patient status: Inpatient  Admitted From: Home Disposition: Home Anticipated discharge date: TBD  Family Communication: wife at bedside  Consults, Procedures, Significant Events  Consultants:  Neurosurgery ID Neurology Cardiology   Procedures/significant events:  hemilaminectomy with washout 11/16  Antimicrobials:  Anti-infectives (From admission, onward)    Start     Dose/Rate Route Frequency Ordered Stop  12/07/20 1800  vancomycin (VANCOREADY) IVPB 1000 mg/200 mL        1,000 mg 200 mL/hr over 60 Minutes Intravenous Every 8 hours 12/07/20 1630     12/03/20 1000  vancomycin (VANCOREADY) IVPB 1000 mg/200 mL  Status:  Discontinued        1,000 mg 200 mL/hr over 60 Minutes Intravenous Every 12 hours 12/03/20 0850  12/03/20 0850   12/03/20 1000  vancomycin (VANCOREADY) IVPB 1500 mg/300 mL  Status:  Discontinued        1,500 mg 150 mL/hr over 120 Minutes Intravenous Every 12 hours 12/03/20 0850 12/07/20 1630   12/01/20 1630  ceftaroline (TEFLARO) 600 mg in sodium chloride 0.9 % 100 mL IVPB        600 mg 100 mL/hr over 60 Minutes Intravenous Every 8 hours 12/01/20 1533     12/01/20 0800  cefTRIAXone (ROCEPHIN) 2 g in sodium chloride 0.9 % 100 mL IVPB  Status:  Discontinued        2 g 200 mL/hr over 30 Minutes Intravenous Every 24 hours 11/30/20 2311 12/01/20 0032   12/01/20 0700  vancomycin (VANCOREADY) IVPB 1000 mg/200 mL  Status:  Discontinued        1,000 mg 200 mL/hr over 60 Minutes Intravenous Every 12 hours 11/30/20 2319 12/03/20 0850   12/01/20 0700  ceFEPIme (MAXIPIME) 2 g in sodium chloride 0.9 % 100 mL IVPB  Status:  Discontinued        2 g 200 mL/hr over 30 Minutes Intravenous Every 8 hours 12/01/20 0054 12/01/20 0128   12/01/20 0618  metroNIDAZOLE (FLAGYL) IVPB 500 mg  Status:  Discontinued        500 mg 100 mL/hr over 60 Minutes Intravenous Every 6 hours 12/01/20 0031 12/01/20 1237   12/01/20 0600  cefTRIAXone (ROCEPHIN) 2 g in sodium chloride 0.9 % 100 mL IVPB  Status:  Discontinued        2 g 200 mL/hr over 30 Minutes Intravenous Every 12 hours 12/01/20 0035 12/01/20 0049   12/01/20 0045  cefTRIAXone (ROCEPHIN) 2 g in sodium chloride 0.9 % 100 mL IVPB  Status:  Discontinued        2 g 200 mL/hr over 30 Minutes Intravenous Every 12 hours 12/01/20 0032 12/01/20 0035   12/01/20 0000  metroNIDAZOLE (FLAGYL) IVPB 500 mg  Status:  Discontinued        500 mg 100 mL/hr over 60 Minutes Intravenous Every 12 hours 11/30/20 2311 12/01/20 0031   11/30/20 2300  ceFEPIme (MAXIPIME) 2 g in sodium chloride 0.9 % 100 mL IVPB  Status:  Discontinued        2 g 200 mL/hr over 30 Minutes Intravenous Every 8 hours 11/30/20 1816 12/01/20 0000   11/30/20 1930  acyclovir (ZOVIRAX) 725 mg in dextrose 5 % 150  mL IVPB  Status:  Discontinued        10 mg/kg  72.6 kg 164.5 mL/hr over 60 Minutes Intravenous Every 8 hours 11/30/20 1915 12/01/20 0032   11/30/20 1500  vancomycin (VANCOREADY) IVPB 1750 mg/350 mL        1,750 mg 175 mL/hr over 120 Minutes Intravenous  Once 11/30/20 1443 11/30/20 2200   11/30/20 1445  ceFEPIme (MAXIPIME) 2 g in sodium chloride 0.9 % 100 mL IVPB        2 g 200 mL/hr over 30 Minutes Intravenous  Once 11/30/20 1436 11/30/20 1728   11/30/20 1445  metroNIDAZOLE (FLAGYL) IVPB 500 mg  500 mg 100 mL/hr over 60 Minutes Intravenous  Once 11/30/20 1436 11/30/20 1835   11/30/20 1445  vancomycin (VANCOCIN) IVPB 1000 mg/200 mL premix  Status:  Discontinued        1,000 mg 200 mL/hr over 60 Minutes Intravenous  Once 11/30/20 1436 11/30/20 1443       Objective   Vitals:   12/08/20 1700 12/08/20 1816 12/08/20 2021 12/09/20 0456  BP: 136/82 140/79 (!) 147/79 (!) 148/74  Pulse:  76 78 66  Resp: (!) 24 15 17 18   Temp:  99.1 F (37.3 C) 98.4 F (36.9 C) 98.9 F (37.2 C)  TempSrc:      SpO2:  96% 94% 95%  Weight:      Height:        Intake/Output Summary (Last 24 hours) at 12/09/2020 0648 Last data filed at 12/09/2020 0438 Gross per 24 hour  Intake --  Output 2075 ml  Net -2075 ml    Filed Weights   11/30/20 1311 11/30/20 2216  Weight: 72.6 kg 73.4 kg    Patient BMI: Body mass index is 23.9 kg/m.   Physical Exam: General: awake, alert, NAD HEENT: atraumatic, clear conjunctiva, anicteric sclera, moist mucus membranes, hearing grossly normal Neck: patient gave good effort to attempt to ROM his neck but had very little movement in any direction and had severe pain. Respiratory: normal respiratory effort. Cardiovascular: quick capillary refill  Nervous: A&O x3. no gross focal neurologic deficits, normal speech Extremities: moves all equally, no edema, normal tone Skin: dry, intact, normal temperature, normal color, excoriations on forearms improved from  admission. Psychiatry: flat mood and affect  Labs   CBC    Component Value Date/Time   WBC 20.1 (H) 12/09/2020 0255   RBC 2.78 (L) 12/09/2020 0255   HGB 8.4 (L) 12/09/2020 0255   HCT 24.3 (L) 12/09/2020 0255   PLT 514 (H) 12/09/2020 0255   MCV 87.4 12/09/2020 0255   MCH 30.2 12/09/2020 0255   MCHC 34.6 12/09/2020 0255   RDW 15.5 12/09/2020 0255   LYMPHSABS 2.0 12/06/2020 0436   MONOABS 1.3 (H) 12/06/2020 0436   EOSABS 0.0 12/06/2020 0436   BASOSABS 0.0 12/06/2020 0436   BMP Latest Ref Rng & Units 12/09/2020 12/08/2020 12/07/2020  Glucose 70 - 99 mg/dL 90 101(H) 103(H)  BUN 6 - 20 mg/dL 20 17 27(H)  Creatinine 0.61 - 1.24 mg/dL 0.59(L) 0.62 0.54(L)  Sodium 135 - 145 mmol/L 132(L) 132(L) 135  Potassium 3.5 - 5.1 mmol/L 3.3(L) 3.5 3.8  Chloride 98 - 111 mmol/L 103 102 104  CO2 22 - 32 mmol/L 25 25 25   Calcium 8.9 - 10.3 mg/dL 7.3(L) 7.3(L) 7.5(L)   Imaging Studies  MR CERVICAL SPINE W WO CONTRAST  Result Date: 12/08/2020 CLINICAL DATA:  Known cervical epidural abscess, mid and low back pain, suspect worsening infection EXAM: MRI CERVICAL, THORACIC AND LUMBAR SPINE WITHOUT AND WITH CONTRAST TECHNIQUE: Multiplanar and multiecho pulse sequences of the cervical spine, to include the craniocervical junction and cervicothoracic junction, and thoracic and lumbar spine, were obtained without and with intravenous contrast. CONTRAST:  61mL GADAVIST GADOBUTROL 1 MMOL/ML IV SOLN COMPARISON:  11/30/2020 MRI cervical spine, correlation is also made with 11/30/2020 CT cervical, thoracic, and lumbar spine. FINDINGS: MRI CERVICAL SPINE FINDINGS Alignment: Straightening the normal cervical lordosis. No significant listhesis Vertebrae: Increased T2 signal and enhancement in the C4 vertebral body, which is new from the prior exam, with similar signal at the superior posterior aspect of C5 (series 7,  image 10 and series 42, image 9). The endplates appear intact. No abnormal signal in the C4-C5 disc space.  Cord: Spinal cord is normal in signal and morphology. No abnormal spinal cord enhancement. Posterior to the spinal cord, there is a circumferential collection the length of the cervical spine, with peripheral contrast enhancement, which measures up to 4 mm at the level of C5 (series 5, image 8), previously 3 mm, and causes moderate thecal sac narrowing. In addition it now measures up to 5 mm at the level C7-T1 (series 5, image 8), previously 3 mm, but without significant thecal sac narrowing. The previously noted greatest dimension the collection at the C2-C3 level now measures 3 mm (series 42, image 8), previously 5 mm when measured similarly. Posterior Fossa, vertebral arteries, paraspinal tissues: Redemonstrated prevertebral effusion, now measuring up to 10 mm at the anterior aspect of C4 (series 7, image 7), previously 7 mm. Status post interval right hemilaminectomy C2-C4 for drainage of the epidural abscess, with a fluid collection in the right paraspinal tissues, measuring up to 1.9 x 2.3 x 5.1 cm in the paraspinous musculature (series 8, image 5 and series 5, image 5), with a connection to a smaller more superficial collection that measures 1.5 x 3.2 x 4.1 cm (series 8, image 1 and series 5, image 8) these collections demonstrate peripheral enhancement but do not enhance internally. Additional surrounding increased T2 signal and enhancement, likely edema. Disc levels: Mild degenerative changes without osseous spinal canal stenosis. Disc bulge at C5-C6, which in conjunction with the epidural collection, causes moderate thecal sac narrowing. MRI THORACIC SPINE FINDINGS Alignment:  Physiologic. Vertebrae: No acute fracture or suspicious osseous lesion. No abnormal enhancement. T1 and T2 hyperintense lesions, likely benign hemangiomas, most prominent in T1 and T6. Cord: Normal signal and morphology. No abnormal spinal cord enhancement. Circumferential enhancing collection extends from the cervicothoracic  junction to approximately the level of T6-T7 (series 41, image 11). This does not cause any significant thecal sac narrowing in the thoracic spine. Paraspinal and other soft tissues: Negative. Disc levels: No significant spinal canal stenosis or neural foraminal narrowing. MRI LUMBAR SPINE FINDINGS Segmentation:  Standard. Alignment:  Physiologic. Vertebrae: Increased T2 signal and diffuse enhancement in the L5 vertebral body and at the superior aspect of S1, with fluid in and enhancement in the disc space (series 33, image 10 and series 38, image 10). Less diffuse increased T2 signal and contrast enhancement in the posterior aspect of L4 (series 38, image 9). Similar abnormal signal is also seen in the spinous processes of L4 and L5. Additional T1 and T2 hyperintense lesions, consistent with benign hemangiomas. Conus medullaris and cauda equina: Conus extends to the L1 level. Conus and cauda equina appear normal. Epidural collection along the dorsal aspect of the thecal sac beginning at the level of L1 and extending through the sacrum. The collection measures up to 11 mm at the level of L2-L3 (series 38, image 10), where it causes severe thecal sac narrowing. The collection is more circumferential from L3-L4 through the sacrum. Paraspinal and other soft tissues: Loculated fluid collection with peripheral enhancement in the right paraspinous musculature (series 36, image 31, series 39, image 31, series 33, image 7 and series 38, image 7), extending from the superior aspect of L3 to the superior aspect of S1 and measuring up to 2.6 x 2.0 x 8.0 cm (series 36, image 30 and series 33, image 7), with additional enhancement and increased T2 signal, likely edema, extending from L1-L2 to  the inferior edge of the field-of-view. Suspect a small subcentimeter psoas abscess on the right at L1 (series 36, image 11), with additional enhancement and increased T2 signal in the medial aspect of the psoas bilaterally, without focal  collection. Disc levels: T12-L1: No significant disc bulge. No spinal canal stenosis or neural foraminal narrowing. L1-L2: Dorsal epidural collection measuring up to 8 mm. Mild to moderate thecal sac narrowing. No significant disc bulge. No neural foraminal narrowing. L2-L3: Dorsal epidural collection measuring up to 11 mm, causing severe thecal sac narrowing. No significant disc bulge. No significant neural foraminal narrowing. L3-L4: Dorsal epidural collection measuring to 7 mm. Moderate facet arthropathy. Moderate to severe thecal sac narrowing. No osseous neural foraminal narrowing. L4-L5: More circumferential epidural collection, which causes moderate to severe thecal sac narrowing. Moderate facet arthropathy. Minimal disc bulge. No significant neural foraminal narrowing. L5-S1: Severe disc height loss and left greater than right disc bulge, with left paracentral annular fissure. Circumferential epidural collection, which causes moderate to severe thecal sac narrowing. Mild facet arthropathy. Mild bilateral neural foraminal narrowing. IMPRESSION: CERVICAL SPINE FINDINGS 1. 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 The collection is decreased in size at the C2-C3 level, but increased in size at C5 and C7, with moderate thecal sac narrowing at C5 from the combination of the collection and a small disc bulge. 2. Status post C2-C4 right hemilaminectomy and decompression, with a fluid collection in the paraspinous musculature and superficial soft tissues, as well as surrounding edema, favored to be a postoperative seroma. 3. Increased T2 signal and enhancement in the C4 vertebral body and superior posterior aspect of C5, which are new from the prior exam. No abnormal disc signal, however this is concerning for developing osteomyelitis. Attention on follow-up. 4. Increased prevertebral edema. 5. No abnormal spinal  cord signal. THORACIC SPINE FINDINGS 1. 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 No significant thecal sac narrowing in the thoracic spine. 2. No abnormal vertebral body enhancement or abnormal spinal cord signal. LUMBAR SPINE FINDINGs 1. 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 2. Increased T2 signal and enhancement in L5 and at the superior aspect of S1, with fluid and enhancement in the disc space, concerning for osteomyelitis discitis. Additional abnormal signal in the posterior aspect of L4 may represent early osteomyelitis. Attention on follow-up. 3. Loculated fluid collection with peripheral enhancement in the right paraspinous musculature, most likely abscesses, extending from the level of L3 to S1, with additional surrounding edema. Possible additional subcentimeter psoas abscess on the right. 4. No abnormal spinal cord signal or cauda equina enhancement in the lumbar spine. Electronically Signed   By: Merilyn Baba M.D.   On: 12/08/2020 23:36   MR THORACIC SPINE W WO CONTRAST  Result Date: 12/08/2020 CLINICAL DATA:  Known cervical epidural abscess, mid and low back pain, suspect worsening infection EXAM: MRI CERVICAL, THORACIC AND LUMBAR SPINE WITHOUT AND WITH CONTRAST TECHNIQUE: Multiplanar and multiecho pulse sequences of the cervical spine, to include the craniocervical junction and cervicothoracic junction, and thoracic and lumbar spine, were obtained without and with intravenous contrast. CONTRAST:  52mL GADAVIST GADOBUTROL 1 MMOL/ML IV SOLN COMPARISON:  11/30/2020 MRI cervical spine, correlation is  also made with 11/30/2020 CT cervical, thoracic, and lumbar spine. FINDINGS: MRI CERVICAL SPINE FINDINGS Alignment: Straightening the normal cervical  lordosis. No significant listhesis Vertebrae: Increased T2 signal and enhancement in the C4 vertebral body, which is new from the prior exam, with similar signal at the superior posterior aspect of C5 (series 7, image 10 and series 42, image 9). The endplates appear intact. No abnormal signal in the C4-C5 disc space. Cord: Spinal cord is normal in signal and morphology. No abnormal spinal cord enhancement. Posterior to the spinal cord, there is a circumferential collection the length of the cervical spine, with peripheral contrast enhancement, which measures up to 4 mm at the level of C5 (series 5, image 8), previously 3 mm, and causes moderate thecal sac narrowing. In addition it now measures up to 5 mm at the level C7-T1 (series 5, image 8), previously 3 mm, but without significant thecal sac narrowing. The previously noted greatest dimension the collection at the C2-C3 level now measures 3 mm (series 42, image 8), previously 5 mm when measured similarly. Posterior Fossa, vertebral arteries, paraspinal tissues: Redemonstrated prevertebral effusion, now measuring up to 10 mm at the anterior aspect of C4 (series 7, image 7), previously 7 mm. Status post interval right hemilaminectomy C2-C4 for drainage of the epidural abscess, with a fluid collection in the right paraspinal tissues, measuring up to 1.9 x 2.3 x 5.1 cm in the paraspinous musculature (series 8, image 5 and series 5, image 5), with a connection to a smaller more superficial collection that measures 1.5 x 3.2 x 4.1 cm (series 8, image 1 and series 5, image 8) these collections demonstrate peripheral enhancement but do not enhance internally. Additional surrounding increased T2 signal and enhancement, likely edema. Disc levels: Mild degenerative changes without osseous spinal canal stenosis. Disc bulge at C5-C6, which in conjunction with the epidural collection, causes moderate thecal sac narrowing. MRI THORACIC SPINE FINDINGS Alignment:  Physiologic.  Vertebrae: No acute fracture or suspicious osseous lesion. No abnormal enhancement. T1 and T2 hyperintense lesions, likely benign hemangiomas, most prominent in T1 and T6. Cord: Normal signal and morphology. No abnormal spinal cord enhancement. Circumferential enhancing collection extends from the cervicothoracic junction to approximately the level of T6-T7 (series 41, image 11). This does not cause any significant thecal sac narrowing in the thoracic spine. Paraspinal and other soft tissues: Negative. Disc levels: No significant spinal canal stenosis or neural foraminal narrowing. MRI LUMBAR SPINE FINDINGS Segmentation:  Standard. Alignment:  Physiologic. Vertebrae: Increased T2 signal and diffuse enhancement in the L5 vertebral body and at the superior aspect of S1, with fluid in and enhancement in the disc space (series 33, image 10 and series 38, image 10). Less diffuse increased T2 signal and contrast enhancement in the posterior aspect of L4 (series 38, image 9). Similar abnormal signal is also seen in the spinous processes of L4 and L5. Additional T1 and T2 hyperintense lesions, consistent with benign hemangiomas. Conus medullaris and cauda equina: Conus extends to the L1 level. Conus and cauda equina appear normal. Epidural collection along the dorsal aspect of the thecal sac beginning at the level of L1 and extending through the sacrum. The collection measures up to 11 mm at the level of L2-L3 (series 38, image 10), where it causes severe thecal sac narrowing. The collection is more circumferential from L3-L4 through the sacrum. Paraspinal and other soft tissues: Loculated fluid collection with peripheral enhancement in the right paraspinous musculature (series 36, image 31, series 39, image 31,  series 33, image 7 and series 38, image 7), extending from the superior aspect of L3 to the superior aspect of S1 and measuring up to 2.6 x 2.0 x 8.0 cm (series 36, image 30 and series 33, image 7), with additional  enhancement and increased T2 signal, likely edema, extending from L1-L2 to the inferior edge of the field-of-view. Suspect a small subcentimeter psoas abscess on the right at L1 (series 36, image 11), with additional enhancement and increased T2 signal in the medial aspect of the psoas bilaterally, without focal collection. Disc levels: T12-L1: No significant disc bulge. No spinal canal stenosis or neural foraminal narrowing. L1-L2: Dorsal epidural collection measuring up to 8 mm. Mild to moderate thecal sac narrowing. No significant disc bulge. No neural foraminal narrowing. L2-L3: Dorsal epidural collection measuring up to 11 mm, causing severe thecal sac narrowing. No significant disc bulge. No significant neural foraminal narrowing. L3-L4: Dorsal epidural collection measuring to 7 mm. Moderate facet arthropathy. Moderate to severe thecal sac narrowing. No osseous neural foraminal narrowing. L4-L5: More circumferential epidural collection, which causes moderate to severe thecal sac narrowing. Moderate facet arthropathy. Minimal disc bulge. No significant neural foraminal narrowing. L5-S1: Severe disc height loss and left greater than right disc bulge, with left paracentral annular fissure. Circumferential epidural collection, which causes moderate to severe thecal sac narrowing. Mild facet arthropathy. Mild bilateral neural foraminal narrowing. IMPRESSION: CERVICAL SPINE FINDINGS 1. 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 The collection is decreased in size at the C2-C3 level, but increased in size at C5 and C7, with moderate thecal sac narrowing at C5 from the combination of the collection and a small disc bulge. 2. Status post C2-C4 right hemilaminectomy and decompression, with a fluid collection in the paraspinous musculature and superficial soft tissues, as well as surrounding edema, favored to be a  postoperative seroma. 3. Increased T2 signal and enhancement in the C4 vertebral body and superior posterior aspect of C5, which are new from the prior exam. No abnormal disc signal, however this is concerning for developing osteomyelitis. Attention on follow-up. 4. Increased prevertebral edema. 5. No abnormal spinal cord signal. THORACIC SPINE FINDINGS 1. 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 No significant thecal sac narrowing in the thoracic spine. 2. No abnormal vertebral body enhancement or abnormal spinal cord signal. LUMBAR SPINE FINDINGs 1. 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 2. Increased T2 signal and enhancement in L5 and at the superior aspect of S1, with fluid and enhancement in the disc space, concerning for osteomyelitis discitis. Additional abnormal signal in the posterior aspect of L4 may represent early osteomyelitis. Attention on follow-up. 3. Loculated fluid collection with peripheral enhancement in the right paraspinous musculature, most likely abscesses, extending from the level of L3 to S1, with additional surrounding edema. Possible additional subcentimeter psoas abscess on the right. 4. No abnormal spinal cord signal or cauda equina enhancement in the lumbar spine. Electronically Signed   By: Merilyn Baba M.D.   On: 12/08/2020 23:36   MR Lumbar Spine W Wo Contrast  Result Date: 12/08/2020 CLINICAL DATA:  Known cervical epidural abscess, mid and low back pain, suspect worsening infection EXAM: MRI CERVICAL, THORACIC  AND LUMBAR SPINE WITHOUT AND WITH CONTRAST TECHNIQUE: Multiplanar and multiecho pulse sequences of the cervical spine, to include the craniocervical junction and cervicothoracic junction, and thoracic and lumbar  spine, were obtained without and with intravenous contrast. CONTRAST:  63mL GADAVIST GADOBUTROL 1 MMOL/ML IV SOLN COMPARISON:  11/30/2020 MRI cervical spine, correlation is also made with 11/30/2020 CT cervical, thoracic, and lumbar spine. FINDINGS: MRI CERVICAL SPINE FINDINGS Alignment: Straightening the normal cervical lordosis. No significant listhesis Vertebrae: Increased T2 signal and enhancement in the C4 vertebral body, which is new from the prior exam, with similar signal at the superior posterior aspect of C5 (series 7, image 10 and series 42, image 9). The endplates appear intact. No abnormal signal in the C4-C5 disc space. Cord: Spinal cord is normal in signal and morphology. No abnormal spinal cord enhancement. Posterior to the spinal cord, there is a circumferential collection the length of the cervical spine, with peripheral contrast enhancement, which measures up to 4 mm at the level of C5 (series 5, image 8), previously 3 mm, and causes moderate thecal sac narrowing. In addition it now measures up to 5 mm at the level C7-T1 (series 5, image 8), previously 3 mm, but without significant thecal sac narrowing. The previously noted greatest dimension the collection at the C2-C3 level now measures 3 mm (series 42, image 8), previously 5 mm when measured similarly. Posterior Fossa, vertebral arteries, paraspinal tissues: Redemonstrated prevertebral effusion, now measuring up to 10 mm at the anterior aspect of C4 (series 7, image 7), previously 7 mm. Status post interval right hemilaminectomy C2-C4 for drainage of the epidural abscess, with a fluid collection in the right paraspinal tissues, measuring up to 1.9 x 2.3 x 5.1 cm in the paraspinous musculature (series 8, image 5 and series 5, image 5), with a connection to a smaller more superficial collection that measures 1.5 x 3.2 x 4.1 cm (series 8, image 1 and series 5, image 8) these collections demonstrate peripheral enhancement but do not enhance  internally. Additional surrounding increased T2 signal and enhancement, likely edema. Disc levels: Mild degenerative changes without osseous spinal canal stenosis. Disc bulge at C5-C6, which in conjunction with the epidural collection, causes moderate thecal sac narrowing. MRI THORACIC SPINE FINDINGS Alignment:  Physiologic. Vertebrae: No acute fracture or suspicious osseous lesion. No abnormal enhancement. T1 and T2 hyperintense lesions, likely benign hemangiomas, most prominent in T1 and T6. Cord: Normal signal and morphology. No abnormal spinal cord enhancement. Circumferential enhancing collection extends from the cervicothoracic junction to approximately the level of T6-T7 (series 41, image 11). This does not cause any significant thecal sac narrowing in the thoracic spine. Paraspinal and other soft tissues: Negative. Disc levels: No significant spinal canal stenosis or neural foraminal narrowing. MRI LUMBAR SPINE FINDINGS Segmentation:  Standard. Alignment:  Physiologic. Vertebrae: Increased T2 signal and diffuse enhancement in the L5 vertebral body and at the superior aspect of S1, with fluid in and enhancement in the disc space (series 33, image 10 and series 38, image 10). Less diffuse increased T2 signal and contrast enhancement in the posterior aspect of L4 (series 38, image 9). Similar abnormal signal is also seen in the spinous processes of L4 and L5. Additional T1 and T2 hyperintense lesions, consistent with benign hemangiomas. Conus medullaris and cauda equina: Conus extends to the L1 level. Conus and cauda equina appear normal. Epidural collection along the dorsal aspect of the thecal sac beginning at the level of L1 and extending through the sacrum. The collection measures  up to 11 mm at the level of L2-L3 (series 38, image 10), where it causes severe thecal sac narrowing. The collection is more circumferential from L3-L4 through the sacrum. Paraspinal and other soft tissues: Loculated fluid  collection with peripheral enhancement in the right paraspinous musculature (series 36, image 31, series 39, image 31, series 33, image 7 and series 38, image 7), extending from the superior aspect of L3 to the superior aspect of S1 and measuring up to 2.6 x 2.0 x 8.0 cm (series 36, image 30 and series 33, image 7), with additional enhancement and increased T2 signal, likely edema, extending from L1-L2 to the inferior edge of the field-of-view. Suspect a small subcentimeter psoas abscess on the right at L1 (series 36, image 11), with additional enhancement and increased T2 signal in the medial aspect of the psoas bilaterally, without focal collection. Disc levels: T12-L1: No significant disc bulge. No spinal canal stenosis or neural foraminal narrowing. L1-L2: Dorsal epidural collection measuring up to 8 mm. Mild to moderate thecal sac narrowing. No significant disc bulge. No neural foraminal narrowing. L2-L3: Dorsal epidural collection measuring up to 11 mm, causing severe thecal sac narrowing. No significant disc bulge. No significant neural foraminal narrowing. L3-L4: Dorsal epidural collection measuring to 7 mm. Moderate facet arthropathy. Moderate to severe thecal sac narrowing. No osseous neural foraminal narrowing. L4-L5: More circumferential epidural collection, which causes moderate to severe thecal sac narrowing. Moderate facet arthropathy. Minimal disc bulge. No significant neural foraminal narrowing. L5-S1: Severe disc height loss and left greater than right disc bulge, with left paracentral annular fissure. Circumferential epidural collection, which causes moderate to severe thecal sac narrowing. Mild facet arthropathy. Mild bilateral neural foraminal narrowing. IMPRESSION: CERVICAL SPINE FINDINGS 1. 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 The collection is decreased in size at the C2-C3 level, but  increased in size at C5 and C7, with moderate thecal sac narrowing at C5 from the combination of the collection and a small disc bulge. 2. Status post C2-C4 right hemilaminectomy and decompression, with a fluid collection in the paraspinous musculature and superficial soft tissues, as well as surrounding edema, favored to be a postoperative seroma. 3. Increased T2 signal and enhancement in the C4 vertebral body and superior posterior aspect of C5, which are new from the prior exam. No abnormal disc signal, however this is concerning for developing osteomyelitis. Attention on follow-up. 4. Increased prevertebral edema. 5. No abnormal spinal cord signal. THORACIC SPINE FINDINGS 1. 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 No significant thecal sac narrowing in the thoracic spine. 2. No abnormal vertebral body enhancement or abnormal spinal cord signal. LUMBAR SPINE FINDINGs 1. 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 2. Increased T2 signal and enhancement in L5 and at the superior aspect of S1, with fluid and enhancement in the disc space, concerning for osteomyelitis discitis. Additional abnormal signal in the posterior aspect of L4 may represent early osteomyelitis. Attention on follow-up. 3. Loculated fluid collection with peripheral enhancement in the right paraspinous musculature, most likely abscesses, extending from the level of L3 to S1, with additional surrounding edema. Possible additional subcentimeter psoas abscess on the right. 4. No abnormal spinal cord  signal or cauda equina enhancement in the lumbar spine. Electronically Signed   By: Merilyn Baba M.D.   On: 12/08/2020 23:36   Medications   Scheduled Meds:  amLODipine  10 mg Oral Daily    Chlorhexidine Gluconate Cloth  6 each Topical Q0600   enoxaparin (LOVENOX) injection  40 mg Subcutaneous Q24H   feeding supplement  237 mL Oral TID BM   losartan  50 mg Oral Daily   multivitamin with minerals  1 tablet Oral Daily   oxyCODONE  5 mg Oral Q6H   polyethylene glycol  17 g Oral Daily   tamsulosin  0.8 mg Oral QPC breakfast   No recently discontinued medications to reconcile  LOS: 9 days   Time spent: >25min  Dinero Chavira L Myrle Wanek, DO [facility] 12/09/2020, 6:48 AM   Please refer to amion to contact the [facility] Attending or Consulting provider for this pt  [email or web address] Available by Epic secure chat 7AM-7PM. If 7PM-7AM, please contact night-coverage

## 2020-12-10 ENCOUNTER — Inpatient Hospital Stay: Payer: BC Managed Care – PPO | Admitting: Anesthesiology

## 2020-12-10 ENCOUNTER — Encounter
Admission: EM | Disposition: A | Discharge status: Home Health | Payer: Self-pay | Source: Home / Self Care | Attending: Student in an Organized Health Care Education/Training Program

## 2020-12-10 ENCOUNTER — Encounter: Payer: Self-pay | Admitting: Student in an Organized Health Care Education/Training Program

## 2020-12-10 ENCOUNTER — Inpatient Hospital Stay: Payer: BC Managed Care – PPO

## 2020-12-10 HISTORY — PX: LUMBAR LAMINECTOMY FOR EPIDURAL ABSCESS: SHX5956

## 2020-12-10 SURGERY — LUMBAR LAMINECTOMY FOR EPIDURAL ABSCESS
Anesthesia: General

## 2020-12-10 MED ORDER — BUPIVACAINE-EPINEPHRINE (PF) 0.5% -1:200000 IJ SOLN
INTRAMUSCULAR | Status: AC
  Filled 2020-12-10: qty 30

## 2020-12-10 MED ORDER — 0.9 % SODIUM CHLORIDE (POUR BTL) OPTIME
TOPICAL | Status: DC | PRN
  Administered 2020-12-10: 500 mL

## 2020-12-10 MED ORDER — DEXAMETHASONE SODIUM PHOSPHATE 10 MG/ML IJ SOLN
INTRAMUSCULAR | Status: AC
  Filled 2020-12-10: qty 1

## 2020-12-10 MED ORDER — FENTANYL CITRATE (PF) 100 MCG/2ML IJ SOLN
25.0000 ug | INTRAMUSCULAR | Status: DC | PRN
  Administered 2020-12-10: 25 ug via INTRAVENOUS
  Administered 2020-12-10: 50 ug via INTRAVENOUS

## 2020-12-10 MED ORDER — LIDOCAINE HCL (PF) 2 % IJ SOLN
INTRAMUSCULAR | Status: AC
  Filled 2020-12-10: qty 5

## 2020-12-10 MED ORDER — ONDANSETRON HCL 4 MG/2ML IJ SOLN
INTRAMUSCULAR | Status: AC
  Filled 2020-12-10: qty 2

## 2020-12-10 MED ORDER — BUPIVACAINE-EPINEPHRINE (PF) 0.5% -1:200000 IJ SOLN
INTRAMUSCULAR | Status: DC | PRN
  Administered 2020-12-10: 10 mL

## 2020-12-10 MED ORDER — SURGIFLO WITH THROMBIN (HEMOSTATIC MATRIX KIT) OPTIME
TOPICAL | Status: DC | PRN
  Administered 2020-12-10: 1 via TOPICAL

## 2020-12-10 MED ORDER — HYDROMORPHONE HCL 1 MG/ML IJ SOLN
0.2500 mg | INTRAMUSCULAR | Status: DC | PRN
  Administered 2020-12-10 (×8): 0.25 mg via INTRAVENOUS

## 2020-12-10 MED ORDER — ROCURONIUM BROMIDE 10 MG/ML (PF) SYRINGE
PREFILLED_SYRINGE | INTRAVENOUS | Status: AC
  Filled 2020-12-10: qty 10

## 2020-12-10 MED ORDER — GLYCOPYRROLATE 0.2 MG/ML IJ SOLN
INTRAMUSCULAR | Status: DC | PRN
  Administered 2020-12-10: .2 mg via INTRAVENOUS

## 2020-12-10 MED ORDER — FENTANYL CITRATE (PF) 100 MCG/2ML IJ SOLN
INTRAMUSCULAR | Status: DC | PRN
  Administered 2020-12-10: 100 ug via INTRAVENOUS

## 2020-12-10 MED ORDER — ONDANSETRON HCL 4 MG/2ML IJ SOLN: 4.0000 mg | Freq: Once | INTRAMUSCULAR | Status: DC | PRN

## 2020-12-10 MED ORDER — LACTATED RINGERS IV SOLN: INTRAVENOUS | Status: DC | PRN

## 2020-12-10 MED ORDER — ONDANSETRON HCL 4 MG/2ML IJ SOLN
INTRAMUSCULAR | Status: DC | PRN
  Administered 2020-12-10: 4 mg via INTRAVENOUS

## 2020-12-10 MED ORDER — VANCOMYCIN HCL 1000 MG IV SOLR
INTRAVENOUS | Status: AC
  Filled 2020-12-10: qty 20

## 2020-12-10 MED ORDER — ROCURONIUM BROMIDE 100 MG/10ML IV SOLN
INTRAVENOUS | Status: DC | PRN
  Administered 2020-12-10: 80 mg via INTRAVENOUS

## 2020-12-10 MED ORDER — GLYCOPYRROLATE 0.2 MG/ML IJ SOLN
INTRAMUSCULAR | Status: AC
  Filled 2020-12-10: qty 1

## 2020-12-10 MED ORDER — PROPOFOL 10 MG/ML IV BOLUS
INTRAVENOUS | Status: AC
  Filled 2020-12-10: qty 20

## 2020-12-10 MED ORDER — GELATIN ABSORBABLE 12-7 MM EX MISC
CUTANEOUS | Status: AC
  Filled 2020-12-10: qty 2

## 2020-12-10 MED ORDER — HYDROMORPHONE HCL 1 MG/ML IJ SOLN
INTRAMUSCULAR | Status: AC
  Filled 2020-12-10: qty 1

## 2020-12-10 MED ORDER — VANCOMYCIN HCL IN DEXTROSE 1-5 GM/200ML-% IV SOLN
INTRAVENOUS | Status: AC
  Filled 2020-12-10: qty 200

## 2020-12-10 MED ORDER — DEXAMETHASONE SODIUM PHOSPHATE 10 MG/ML IJ SOLN
INTRAMUSCULAR | Status: DC | PRN
  Administered 2020-12-10: 10 mg via INTRAVENOUS

## 2020-12-10 MED ORDER — VANCOMYCIN HCL 1000 MG IV SOLR
INTRAVENOUS | Status: DC | PRN
  Administered 2020-12-10: 1000 mg

## 2020-12-10 MED ORDER — FENTANYL CITRATE (PF) 100 MCG/2ML IJ SOLN
INTRAMUSCULAR | Status: AC
  Administered 2020-12-10: 25 ug via INTRAVENOUS
  Filled 2020-12-10: qty 2

## 2020-12-10 MED ORDER — PROPOFOL 10 MG/ML IV BOLUS
INTRAVENOUS | Status: DC | PRN
  Administered 2020-12-10: 150 mg via INTRAVENOUS

## 2020-12-10 MED ORDER — LIDOCAINE HCL (CARDIAC) PF 100 MG/5ML IV SOSY
PREFILLED_SYRINGE | INTRAVENOUS | Status: DC | PRN
  Administered 2020-12-10: 50 mg via INTRAVENOUS

## 2020-12-10 MED ORDER — FENTANYL CITRATE (PF) 100 MCG/2ML IJ SOLN
INTRAMUSCULAR | Status: AC
  Filled 2020-12-10: qty 2

## 2020-12-10 MED ORDER — SUGAMMADEX SODIUM 200 MG/2ML IV SOLN
INTRAVENOUS | Status: DC | PRN
  Administered 2020-12-10: 200 mg via INTRAVENOUS

## 2020-12-10 MED ORDER — LIDOCAINE HCL 4 % MT SOLN
OROMUCOSAL | Status: DC | PRN
  Administered 2020-12-10: 4 mL via TOPICAL

## 2020-12-10 MED ORDER — ACETAMINOPHEN 10 MG/ML IV SOLN
INTRAVENOUS | Status: AC
  Filled 2020-12-10: qty 100

## 2020-12-10 MED ORDER — HYDROMORPHONE HCL 1 MG/ML IJ SOLN
INTRAMUSCULAR | Status: AC
  Administered 2020-12-10: 0.25 mg via INTRAVENOUS
  Filled 2020-12-10: qty 1

## 2020-12-10 MED ORDER — EPHEDRINE SULFATE 50 MG/ML IJ SOLN
INTRAMUSCULAR | Status: DC | PRN
  Administered 2020-12-10: 5 mg via INTRAVENOUS
  Administered 2020-12-10: 10 mg via INTRAVENOUS

## 2020-12-10 MED ORDER — ACETAMINOPHEN 10 MG/ML IV SOLN
INTRAVENOUS | Status: DC | PRN
  Administered 2020-12-10: 1000 mg via INTRAVENOUS

## 2020-12-10 MED ORDER — ROCURONIUM BROMIDE 100 MG/10ML IV SOLN: INTRAVENOUS | Status: DC | PRN

## 2020-12-10 MED ORDER — SUCCINYLCHOLINE CHLORIDE 200 MG/10ML IV SOSY: PREFILLED_SYRINGE | INTRAVENOUS | Status: DC | PRN

## 2020-12-10 MED ORDER — EPHEDRINE 5 MG/ML INJ
INTRAVENOUS | Status: AC
  Filled 2020-12-10: qty 5

## 2020-12-10 SURGICAL SUPPLY — 61 items
BUR NEURO DRILL SOFT 3.0X3.8M (BURR) ×2 IMPLANT
CHLORAPREP W/TINT 26 (MISCELLANEOUS) ×4 IMPLANT
COUNTER NEEDLE 20/40 LG (NEEDLE) ×2 IMPLANT
COVER LIGHT HANDLE STERIS (MISCELLANEOUS) ×4 IMPLANT
CUP MEDICINE 2OZ PLAST GRAD ST (MISCELLANEOUS) ×2 IMPLANT
DERMABOND ADVANCED (GAUZE/BANDAGES/DRESSINGS) ×1
DERMABOND ADVANCED .7 DNX12 (GAUZE/BANDAGES/DRESSINGS) ×1 IMPLANT
DRAPE C-ARM 42X72 X-RAY (DRAPES) ×4 IMPLANT
DRAPE LAPAROTOMY 100X77 ABD (DRAPES) ×2 IMPLANT
DRAPE MICROSCOPE SPINE 48X150 (DRAPES) ×2 IMPLANT
DRAPE SURG 17X11 SM STRL (DRAPES) ×2 IMPLANT
DRSG OPSITE POSTOP 4X6 (GAUZE/BANDAGES/DRESSINGS) IMPLANT
DRSG TEGADERM 2-3/8X2-3/4 SM (GAUZE/BANDAGES/DRESSINGS) ×2 IMPLANT
DRSG TEGADERM 4X4.75 (GAUZE/BANDAGES/DRESSINGS) ×2 IMPLANT
DRSG TELFA 4X3 1S NADH ST (GAUZE/BANDAGES/DRESSINGS) ×2 IMPLANT
DURASEAL APPLICATOR TIP (TIP) IMPLANT
DURASEAL SPINE SEALANT 3ML (MISCELLANEOUS) IMPLANT
ELECT CAUTERY BLADE TIP 2.5 (TIP) ×2
ELECT EZSTD 165MM 6.5IN (MISCELLANEOUS) ×2
ELECT REM PT RETURN 9FT ADLT (ELECTROSURGICAL) ×2
ELECTRODE CAUTERY BLDE TIP 2.5 (TIP) ×1 IMPLANT
ELECTRODE EZSTD 165MM 6.5IN (MISCELLANEOUS) ×1 IMPLANT
ELECTRODE REM PT RTRN 9FT ADLT (ELECTROSURGICAL) ×1 IMPLANT
GAUZE 4X4 16PLY ~~LOC~~+RFID DBL (SPONGE) ×2 IMPLANT
GAUZE SPONGE 4X4 12PLY STRL (GAUZE/BANDAGES/DRESSINGS) ×2 IMPLANT
GLOVE SRG 8 PF TXTR STRL LF DI (GLOVE) ×1 IMPLANT
GLOVE SURG SYN 6.5 ES PF (GLOVE) ×4 IMPLANT
GLOVE SURG SYN 8.0 (GLOVE) ×4 IMPLANT
GLOVE SURG UNDER POLY LF SZ6.5 (GLOVE) ×4 IMPLANT
GLOVE SURG UNDER POLY LF SZ8 (GLOVE) ×1
GOWN SRG LRG LVL 4 IMPRV REINF (GOWNS) ×2 IMPLANT
GOWN STRL REIN LRG LVL4 (GOWNS) ×2
GOWN STRL REUS W/ TWL XL LVL3 (GOWN DISPOSABLE) ×2 IMPLANT
GOWN STRL REUS W/TWL XL LVL3 (GOWN DISPOSABLE) ×2
GRADUATE 1200CC STRL 31836 (MISCELLANEOUS) ×2 IMPLANT
HEMOVAC 400CC 10FR (MISCELLANEOUS) ×2 IMPLANT
IV CATH ANGIO 12GX3 LT BLUE (NEEDLE) ×2 IMPLANT
KIT TURNOVER KIT A (KITS) ×2 IMPLANT
KIT WILSON FRAME (KITS) ×2 IMPLANT
KNIFE BAYONET SHORT DISCETOMY (MISCELLANEOUS) IMPLANT
MANIFOLD NEPTUNE II (INSTRUMENTS) ×2 IMPLANT
MARKER SKIN DUAL TIP RULER LAB (MISCELLANEOUS) ×4 IMPLANT
NDL SAFETY ECLIPSE 18X1.5 (NEEDLE) ×1 IMPLANT
NEEDLE HYPO 18GX1.5 SHARP (NEEDLE) ×1
NEEDLE HYPO 22GX1.5 SAFETY (NEEDLE) ×2 IMPLANT
NS IRRIG 1000ML POUR BTL (IV SOLUTION) ×2 IMPLANT
PACK LAMINECTOMY NEURO (CUSTOM PROCEDURE TRAY) ×2 IMPLANT
PAD ARMBOARD 7.5X6 YLW CONV (MISCELLANEOUS) ×2 IMPLANT
SPONGE GAUZE 2X2 8PLY STRL LF (GAUZE/BANDAGES/DRESSINGS) ×2 IMPLANT
STAPLER SKIN PROX 35W (STAPLE) IMPLANT
SURGIFLO W/THROMBIN 8M KIT (HEMOSTASIS) ×2 IMPLANT
SUT NURALON 4 0 TR CR/8 (SUTURE) IMPLANT
SUT POLYSORB 2-0 5X18 GS-10 (SUTURE) ×6 IMPLANT
SUT VIC AB 0 CT1 18XCR BRD 8 (SUTURE) ×2 IMPLANT
SUT VIC AB 0 CT1 8-18 (SUTURE) ×2
SYR 10ML LL (SYRINGE) ×4 IMPLANT
SYR 30ML LL (SYRINGE) ×2 IMPLANT
SYR 3ML LL SCALE MARK (SYRINGE) ×2 IMPLANT
TOWEL OR 17X26 4PK STRL BLUE (TOWEL DISPOSABLE) ×8 IMPLANT
TUBING CONNECTING 10 (TUBING) ×2 IMPLANT
WATER STERILE IRR 500ML POUR (IV SOLUTION) ×2 IMPLANT

## 2020-12-10 NOTE — Progress Notes (Signed)
PT Cancellation Note  Patient Details Name: Caleb Fisher MRN: [id number] DOB: [birth year]   Cancelled Treatment:    Reason Eval/Treat Not Completed: Patient at procedure or test/unavailable. Chart reviewed. Pt off floor for lumbar laminectomy. Will re-attempt as medically appropriate and available.   Salem Caster. Fairly IV, PT, DPT Physical Therapist- [facility]  12/10/2020, 12:38 PM

## 2020-12-10 NOTE — Transfer of Care (Signed)
Immediate Anesthesia Transfer of Care Note  Patient: Caleb Fisher  Procedure(s) Performed: LUMBAR LAMINECTOMY FOR EPIDURAL ABSCESS  Patient Location: PACU  Anesthesia Type:General  Level of Consciousness: awake  Airway & Oxygen Therapy: Patient Spontanous Breathing and Patient connected to face mask oxygen  Post-op Assessment: Report given to RN and Post -op Vital signs reviewed and stable  Post vital signs: Reviewed  Last Vitals:  Vitals Value Taken Time  BP    Temp    Pulse 90 12/10/20 1619  Resp 23 12/10/20 1619  SpO2 100 % 12/10/20 1619  Vitals shown include unvalidated device data.  Last Pain:  Vitals:   12/10/20 1216  TempSrc: Temporal  PainSc: 0-No pain      Patients Stated Pain Goal: 0 (35/33/17 4099)  Complications: No notable events documented.

## 2020-12-10 NOTE — Op Note (Signed)
Operative Note   SURGERY DATE:  12/10/2020   PRE-OP DIAGNOSIS:  Lumbar Stenosis with Epidural Abscess   POST-OP DIAGNOSIS: Post-Op Diagnosis Codes:  Lumbar Stenosis with Epidural Abscess   Procedure(s) with comments: L2, L3 Laminectomy with Evacuation Epidural Abscess   SURGEON:     * Malen Gauze, MD         ANESTHESIA: General    OPERATIVE FINDINGS: Muscle abscess, Epidural Abscess   Indication: Mr Cardozo was hospitalized with sepsis and had a cervical epidural abscess evacuated last week. He continued to do poorly from infection standpoint and repeat MRIs showed a lareg lumbar epidural abscess. Given this, I discussed evacuation with patient and spouse and need for infection control. We discussed multi-level laminectomy to do this. The risks of surgery were explained to include hematoma, infection, damage to nerve roots, CSF leak, weakness, numbness, pain, need for future surgery, heart attack, and stroke. He elected to proceed with surgery for treatment.    Procedure The patient was brought to the OR after informed consent was obtained. He was given general anesthesia and intubated by the anesthesia service. Vascular access lines were placed.The patient was then placed prone on a Wilson frame ensuring all pressure points were padded. A time-out was performed per protocol.    The patient was sterilely prepped and draped. The incision was planned with fluoroscopy and instilled with local anesthetic with epinephrine.  The skin was opened sharply and the dissection taken to the fascia. This was incised and cautery was used to dissect the subperiosteal plane to expose the spinous processes and lamina of L2-L4. There was seen to be purulent material from the muscle on the right and this was evacuated.  Dissection was continued laterally in order to expose the entire lamina. Retractors were inserted and hemostasis was achieved. X-ray was used to confirm location at the L3 lamina.   Next,  a 72mm drill bit was used to remove the L2 and L3 lamina centrally. The underlying ligament was freed and removed with combination of rongeurs.There was immediate egress of pus and this was suctioned free. Culture was taken. The dura below showed some scarring.  The decompression was taken caudal to the superior border of L4. It was also taken to the lamina of L1 above. There was pus removed from beneath these lamina. The lateral recesses were inspected and all abnormal tissue removed. Ultrasound was used to ensure no residual stenosis.    Given the infection, a hemovac drain was placed and tunneled beneath the fascia.  The muscle was then closed using 0 vicryl followed by the fascia with 0 vicryl. Vancomycin powder was placed in the wound.  Next, the dermal layer was closed with 2-0 vicryl until the epidermis was well approximated. The skin was closed with 3-0 Nylon. A dressing was applied.   The patient was returned to supine position and extubated by the anesthesia service. The patient was then taken to the PACU for post-operative care where he was moving extremities symmetrically.   ESTIMATED BLOOD LOSS:   50 cc   SPECIMENS Epidural abscess      I performed the case in its entirety   Deetta Perla, [place]

## 2020-12-10 NOTE — Progress Notes (Signed)
PROGRESS NOTE  Caleb Fisher    DOB: Oct 22, 1975, 45 y.o.  ERD:[id number]  PCP: Pcp, No   Code Status: Full Code   DOA: 11/30/2020   LOS: 10  Brief Narrative of Current Hospitalization  Caleb Fisher is a 45 y.o. male with a PMH significant for significant tobacco use. They presented from home to the ED on 11/30/2020 with several days of altered mental status and increased back/neck pain. In the ED, it was found that they had sepsis from unknown source. They were treated with IV fluids and antibiotics.  Neurology was consulted for LP.  Subsequently, patient was found to have MRSA bacteremia.  Imaging revealed cervical epidural abscesses involving C2-C5. Patient was admitted to medicine service for further workup and management of sepsis as outlined in detail below.  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 Sepsis physiology resolved. Wound cultures positive for MRSA sensitive to vancomycin. Presumed source is excoriations on forearms. Repeat blood cultures collected 11/19 NGTD.  WBC remains elevated- 21.0 today.  He was febrile to 100.8 overnight.  Repeat MRI of spine 11/23 showed: epidural abscess in lumbar spine, possible early osteomyelitis. Epidural abscess of thoracic spine. Possible osteomyelitis in cervical spine, new from previous imaging.   -ID following, appreciate recommendations  - PICC line placement delayed since febrile. Will need repeat Bcx after surgical clean out and culture.  - currently on vancomycin and ceftaroline. Will need 6 weeks total Abx.    - (11/15-12/27) -Neurosurgery following, appreciate recommendations  - s/p hemilaminectomy with washout 11/16  - lumbar clean out 11/25 - consult cardiology for TEE when able to tolerate procedure - PT/OT  Mild hypokalemia- K+ 3.3 - replete PRN - BMP am  Thrombocytopenia- resolved.   AKI- resolved.  - avoid nephrotoxic agents  Urinary retention- foley removed 11/23 for voiding trial.  - continue increased dose of flomax  - strict I/O - bladder scans q4hr  DVT prophylaxis: Place and maintain sequential compression device Start: 12/09/20 1404 SCD's Start: 12/01/20 1907   Diet:  Diet Orders (From admission, onward)     Start     Ordered   12/10/20 0001  Diet NPO time specified  Diet effective midnight        12/09/20 1403            Subjective 12/10/20    Pt reports feeling improved today. He is hungry and has dry mouth due to being NPO.   Disposition Plan & Communication  Patient status: Inpatient  Admitted From: Home Disposition: Home Anticipated discharge date: TBD  Family Communication: wife at bedside  Consults, Procedures, Significant Events  Consultants:  Neurosurgery ID Neurology Cardiology  General surgery  Procedures/significant events:  hemilaminectomy with washout 11/16 Lumbar clean out 11/25  Antimicrobials:  Anti-infectives (From admission, onward)    Start     Dose/Rate Route Frequency Ordered Stop   12/07/20  1800  vancomycin (VANCOREADY) IVPB 1000 mg/200 mL        1,000 mg 200 mL/hr over 60 Minutes Intravenous Every 8 hours 12/07/20 1630     12/03/20 1000  vancomycin (VANCOREADY) IVPB 1000 mg/200 mL  Status:  Discontinued        1,000 mg 200 mL/hr over 60 Minutes Intravenous Every 12 hours 12/03/20 0850 12/03/20  0850   12/03/20 1000  vancomycin (VANCOREADY) IVPB 1500 mg/300 mL  Status:  Discontinued        1,500 mg 150 mL/hr over 120 Minutes Intravenous Every 12 hours 12/03/20 0850 12/07/20 1630   12/01/20 1630  ceftaroline (TEFLARO) 600 mg in sodium chloride 0.9 % 100 mL IVPB        600 mg 100 mL/hr over 60 Minutes Intravenous Every 8 hours 12/01/20 1533     12/01/20 0800  cefTRIAXone (ROCEPHIN) 2 g in sodium chloride 0.9 % 100 mL IVPB  Status:  Discontinued        2 g 200 mL/hr over 30 Minutes Intravenous Every 24 hours 11/30/20 2311 12/01/20 0032   12/01/20 0700  vancomycin (VANCOREADY) IVPB 1000 mg/200 mL  Status:  Discontinued        1,000 mg 200 mL/hr over 60 Minutes Intravenous Every 12 hours 11/30/20 2319 12/03/20 0850   12/01/20 0700  ceFEPIme (MAXIPIME) 2 g in sodium chloride 0.9 % 100 mL IVPB  Status:  Discontinued        2 g 200 mL/hr over 30 Minutes Intravenous Every 8 hours 12/01/20 0054 12/01/20 0128   12/01/20 0618  metroNIDAZOLE (FLAGYL) IVPB 500 mg  Status:  Discontinued        500 mg 100 mL/hr over 60 Minutes Intravenous Every 6 hours 12/01/20 0031 12/01/20 1237   12/01/20 0600  cefTRIAXone (ROCEPHIN) 2 g in sodium chloride 0.9 % 100 mL IVPB  Status:  Discontinued        2 g 200 mL/hr over 30 Minutes Intravenous Every 12 hours 12/01/20 0035 12/01/20 0049   12/01/20 0045  cefTRIAXone (ROCEPHIN) 2 g in sodium chloride 0.9 % 100 mL IVPB  Status:  Discontinued        2 g 200 mL/hr over 30 Minutes Intravenous Every 12 hours 12/01/20 0032 12/01/20 0035   12/01/20 0000  metroNIDAZOLE (FLAGYL) IVPB 500 mg  Status:  Discontinued        500 mg 100 mL/hr over 60 Minutes Intravenous Every 12 hours 11/30/20 2311 12/01/20 0031   11/30/20 2300  ceFEPIme (MAXIPIME) 2 g in sodium chloride 0.9 % 100 mL IVPB  Status:  Discontinued        2 g 200 mL/hr over 30 Minutes Intravenous Every 8 hours 11/30/20 1816 12/01/20 0000   11/30/20 1930  acyclovir (ZOVIRAX) 725 mg in dextrose 5 % 150 mL IVPB   Status:  Discontinued        10 mg/kg  72.6 kg 164.5 mL/hr over 60 Minutes Intravenous Every 8 hours 11/30/20 1915 12/01/20 0032   11/30/20 1500  vancomycin (VANCOREADY) IVPB 1750 mg/350 mL        1,750 mg 175 mL/hr over 120 Minutes Intravenous  Once 11/30/20 1443 11/30/20 2200   11/30/20 1445  ceFEPIme (MAXIPIME) 2 g in sodium chloride 0.9 % 100 mL IVPB        2 g 200 mL/hr over 30 Minutes Intravenous  Once 11/30/20 1436 11/30/20 1728   11/30/20 1445  metroNIDAZOLE (FLAGYL) IVPB 500 mg  500 mg 100 mL/hr over 60 Minutes Intravenous  Once 11/30/20 1436 11/30/20 1835   11/30/20 1445  vancomycin (VANCOCIN) IVPB 1000 mg/200 mL premix  Status:  Discontinued        1,000 mg 200 mL/hr over 60 Minutes Intravenous  Once 11/30/20 1436 11/30/20 1443       Objective   Vitals:   12/09/20 1546 12/09/20 2000 12/10/20 0038 12/10/20 0525  BP: 121/86 137/74 137/89 (!) 153/85  Pulse: 80 82 67 69  Resp: 17 17 18 18   Temp: 99.1 F (37.3 C) 98.3 F (36.8 C) 98.1 F (36.7 C) 98.3 F (36.8 C)  TempSrc:  Oral Axillary Oral  SpO2: 100% 95% 98% 94%  Weight:      Height:        Intake/Output Summary (Last 24 hours) at 12/10/2020 0700 Last data filed at 12/10/2020 0211 Gross per 24 hour  Intake 1143.96 ml  Output 850 ml  Net 293.96 ml    Filed Weights   11/30/20 1311 11/30/20 2216  Weight: 72.6 kg 73.4 kg    Patient BMI: Body mass index is 23.9 kg/m.   Physical Exam: General: awake, alert, NAD HEENT: atraumatic, clear conjunctiva, anicteric sclera, dry mucus membranes, hearing grossly normal Cardiovascular: quick capillary refill  Nervous: A&O x3. no gross focal neurologic deficits, normal speech Extremities: moves all equally, no edema, normal tone Skin: dry, intact, normal temperature, normal color, excoriations on forearms improved from admission. Psychiatry: flat mood and affect  Labs   CBC    Component Value Date/Time   WBC 20.1 (H) 12/09/2020 0255   RBC 2.78 (L)  12/09/2020 0255   HGB 8.4 (L) 12/09/2020 0255   HCT 24.3 (L) 12/09/2020 0255   PLT 514 (H) 12/09/2020 0255   MCV 87.4 12/09/2020 0255   MCH 30.2 12/09/2020 0255   MCHC 34.6 12/09/2020 0255   RDW 15.5 12/09/2020 0255   LYMPHSABS 2.0 12/06/2020 0436   MONOABS 1.3 (H) 12/06/2020 0436   EOSABS 0.0 12/06/2020 0436   BASOSABS 0.0 12/06/2020 0436   BMP Latest Ref Rng & Units 12/09/2020 12/08/2020 12/07/2020  Glucose 70 - 99 mg/dL 90 101(H) 103(H)  BUN 6 - 20 mg/dL 20 17 27(H)  Creatinine 0.61 - 1.24 mg/dL 0.59(L) 0.62 0.54(L)  Sodium 135 - 145 mmol/L 132(L) 132(L) 135  Potassium 3.5 - 5.1 mmol/L 3.3(L) 3.5 3.8  Chloride 98 - 111 mmol/L 103 102 104  CO2 22 - 32 mmol/L 25 25 25   Calcium 8.9 - 10.3 mg/dL 7.3(L) 7.3(L) 7.5(L)   Imaging Studies  No results found. Medications   Scheduled Meds:  amLODipine  10 mg Oral Daily   Chlorhexidine Gluconate Cloth  6 each Topical Q0600   feeding supplement  237 mL Oral TID BM   losartan  50 mg Oral Daily   multivitamin with minerals  1 tablet Oral Daily   oxyCODONE  5 mg Oral Q6H   polyethylene glycol  17 g Oral Daily   tamsulosin  0.8 mg Oral QPC breakfast   No recently discontinued medications to reconcile  LOS: 10 days   Time spent: >45min  Delayza Lungren L Kemisha Bonnette, DO [facility] 12/10/2020, 7:00 AM   Please refer to amion to contact the Caleb Fisher Attending or Consulting provider for this pt  [email or web address] Available by Epic secure chat 7AM-7PM. If 7PM-7AM, please contact night-coverage

## 2020-12-10 NOTE — Progress Notes (Signed)
Patient asleep when this RN rounded on patient. Mouth swabs given to family at bedside for patient.

## 2020-12-10 NOTE — Interval H&P Note (Signed)
History and Physical Interval Note:  12/10/2020 1:34 PM  Caleb Fisher  has presented today for surgery, with the diagnosis of Epidural Abscess.  The various methods of treatment have been discussed with the patient and family. After consideration of risks, benefits and other options for treatment, the patient has consented to  Procedure(s): [place] (N/A) as a surgical intervention.  The patient's history has been reviewed, patient examined, no change in status, stable for surgery.  I have reviewed the patient's chart and labs.  Questions were answered to the patient's satisfaction.     Deetta Perla

## 2020-12-10 NOTE — Anesthesia Postprocedure Evaluation (Signed)
Anesthesia Post Note  Patient: Caleb Fisher  Procedure(s) Performed: LUMBAR LAMINECTOMY FOR EPIDURAL ABSCESS  Patient location during evaluation: PACU Anesthesia Type: General Level of consciousness: awake and alert Pain management: pain level controlled Vital Signs Assessment: post-procedure vital signs reviewed and stable Respiratory status: spontaneous breathing, nonlabored ventilation, respiratory function stable and patient connected to nasal cannula oxygen Cardiovascular status: blood pressure returned to baseline and stable Postop Assessment: no apparent nausea or vomiting Anesthetic complications: no   No notable events documented.   Last Vitals:  Vitals:   12/10/20 1715 12/10/20 1730  BP: 126/74   Pulse: 82 81  Resp: 16 14  Temp:    SpO2: 96% 96%    Last Pain:  Vitals:   12/10/20 1730  TempSrc:   PainSc: 8                  Arita Miss

## 2020-12-10 NOTE — Anesthesia Preprocedure Evaluation (Addendum)
Anesthesia Evaluation  Patient identified by MRN, date of birth, ID band Patient awake    Reviewed: Allergy & Precautions, H&P , NPO status , Patient's Chart, lab work & pertinent test results  History of Anesthesia Complications Negative for: history of anesthetic complications  Airway Mallampati: III  TM Distance: >3 FB Neck ROM: Limited   Comment: Small mouth opening Very limited neck extension Dental   Poor dentition:   Pulmonary neg sleep apnea, neg COPD, Current Smoker,  Septic pulmonary emboli SpO2 94-98% RA   breath sounds clear to auscultation       Cardiovascular hypertension, (-) angina(-) Past MI and (-) Cardiac Stents (-) dysrhythmias  Rhythm:regular Rate:Normal     Neuro/Psych Abscesses of cervical, thoracic and lumbar spine  "45 y.o male presenting with AMS and sepsis. Blood cultures showed MRSA bacteremia. 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 Pt underwent C2-4 right hemilaminectomies for evacuation of epidural on 12/01/20. Repeat MRI of spine 11/23 showed: epidural abscess in lumbar spine, possible early osteomyelitis. Epidural abscess of thoracic spine. Possible osteomyelitis in cervical spine, new from previous imaging"  Posted for lumbar laminectomy for epidural abscess negative neurological ROS  negative psych ROS   GI/Hepatic negative GI ROS, Neg liver ROS,   Endo/Other  negative endocrine ROS  Renal/GU      Musculoskeletal   Abdominal   Peds  Hematology  (+) Blood dyscrasia, anemia , Hgb 8.4 g/dL   Anesthesia Other Findings "Presented 12/01/20 with tachycardia, tachypnea, fevers, acute encephalopathy consistent with organ dysfunction and severe sepsis. Sepsis physiology resolved. Wound cultures positive for MRSA sensitive to vancomycin. Presumed source is excoriations on forearms. Repeat blood cultures collected 11/19 NGTD"  Reproductive/Obstetrics negative OB ROS                            Anesthesia Physical Anesthesia Plan  ASA: 4  Anesthesia Plan: General   Post-op Pain Management:    Induction: Intravenous  PONV Risk Score and Plan: Ondansetron, Dexamethasone, Midazolam and Treatment may vary due to age or medical condition  Airway Management Planned: Oral ETT and Video Laryngoscope Planned  Additional Equipment:   Intra-op Plan:   Post-operative Plan:   Informed Consent: I have reviewed the patients History and Physical, chart, labs and discussed the procedure including the risks, benefits and alternatives for the proposed anesthesia with the patient or authorized representative who has indicated his/her understanding and acceptance.     Dental Advisory Given  Plan Discussed with: Anesthesiologist, CRNA and Surgeon  Anesthesia Plan Comments:        Anesthesia Quick Evaluation

## 2020-12-10 NOTE — TOC Progression Note (Signed)
Transition of Care [facility]) - Progression Note    Patient Details  Name: Caleb Fisher MRN: [id number] Date of Birth: [birth year]  Transition of Care [facility]) CM/SW [place], RN Phone Number: 12/10/2020, 8:56 AM  Clinical Narrative:   Tommi Rumps with [facility] has accepted the patient for PT services, [facility] will accept the patient for Nursing, [facility] is on board for the medication.          Expected Discharge Plan and Services                                                 Social Determinants of Health (SDOH) Interventions    Readmission Risk Interventions No flowsheet data found.

## 2020-12-10 NOTE — Progress Notes (Signed)
Date of Admission:  11/30/2020   T  ID: Caleb Fisher is a 45 y.o. male Active Problems:   Encephalopathy acute   Altered mental status   MRSA bacteremia   Abscess in epidural space of cervical spine   Hypertensive urgency   Hypokalemia   Thrombocytopenia (HCC)   Acute metabolic encephalopathy   Severe sepsis (HCC)   Acute urinary retention   Abnormal LFTs   Acute hypercapnic respiratory failure ([place])   Fall   Acute respiratory failure with hypoxia (HCC)   Epidural abscess   Transaminitis    Subjective: Pt is in the recovery room Just had lumbar surgery for epidural abscess- underwent L2-L3 laminectomy with evacuation of epidural abscess Says he is feeling okay Pain - but controlled with meds  Medications:   [MAR Hold] amLODipine  10 mg Oral Daily   [MAR Hold] Chlorhexidine Gluconate Cloth  6 each Topical Q0600   [MAR Hold] feeding supplement  237 mL Oral TID BM   [MAR Hold] losartan  50 mg Oral Daily   [MAR Hold] multivitamin with minerals  1 tablet Oral Daily   [MAR Hold] oxyCODONE  5 mg Oral Q6H   [MAR Hold] polyethylene glycol  17 g Oral Daily   [MAR Hold] tamsulosin  0.8 mg Oral QPC breakfast    Objective: Vital signs in last 24 hours: Temp:  [98.1 F (36.7 C)-98.5 F (36.9 C)] 98.5 F (36.9 C) (11/25 1700) Pulse Rate:  [60-82] 81 (11/25 1730) Resp:  [13-22] 14 (11/25 1730) BP: (126-155)/(74-90) 126/74 (11/25 1715) SpO2:  [94 %-99 %] 96 % (11/25 1730)  PHYSICAL EXAM:  General: Alert, cooperative,  Head: Normocephalic, without obvious abnormality, atraumatic. Eyes: Conjunctivae clear, anicteric sclerae. Pupils are equal ENT Nares normal. No drainage or sinus tenderness. Lips, mucosa, and tongue normal. No Thrush Neck: symmetrical no carotid bruit and no JVD. Back: No CVA tenderness. Lungs: b/l air entry Heart: Regular rate and rhythm, no murmur, rub or gallop. Abdomen: Soft, non-tender,not distended. Bowel sounds normal. No masses Extremities:  atraumatic, no cyanosis. No edema. No clubbing Skin: No rashes or lesions. Or bruising Lymph: Cervical, supraclavicular normal. Neurologic: lumbar drain  Lab Results Recent Labs    12/08/20 0238 12/09/20 0255  WBC 21.0* 20.1*  HGB 8.5* 8.4*  HCT 25.3* 24.3*  NA 132* 132*  K 3.5 3.3*  CL 102 103  CO2 25 25  BUN 17 20  CREATININE 0.62 0.59*   Liver Panel Recent Labs    12/08/20 0238  PROT 5.0*  ALBUMIN 1.7*  AST 38  ALT 38  ALKPHOS 152*  BILITOT 1.3*  BILIDIR 0.6*  IBILI 0.7   Sedimentation Rate No results for input(s): ESRSEDRATE in the last 72 hours. C-Reactive Protein No results for input(s): CRP in the last 72 hours.  Microbiology: 11/15 [facility] - MRSA 11/17 [facility] MRSA 11/19 BC NG 11/16 forearm wound- MRSA 12/01/20 cervical spine MRSA 11/23 Finger abscess- MRSA 11/25-lumbar abscess  Studies/Results: DG Lumbar Spine 2-3 Views  Result Date: 12/10/2020 CLINICAL DATA:  Lumbar laminectomy for epidural abscess EXAM: LUMBAR SPINE - 2-3 VIEW COMPARISON:  Lumbar MRI 12/08/2020 FINDINGS: C-arm images were obtained of the lateral lumbar spine. These reveal localization of the spinal canal at the L3-4 level. IMPRESSION: Instrument overlying the spinal canal at the L3-4 level. Electronically Signed   By: Franchot Gallo M.D.   On: 12/10/2020 15:49   MR CERVICAL SPINE W WO CONTRAST  Result Date: 12/08/2020 CLINICAL DATA:  Known cervical epidural abscess, mid  and low back pain, suspect worsening infection EXAM: MRI CERVICAL, THORACIC AND LUMBAR SPINE WITHOUT AND WITH CONTRAST TECHNIQUE: Multiplanar and multiecho pulse sequences of the cervical spine, to include the craniocervical junction and cervicothoracic junction, and thoracic and lumbar spine, were obtained without and with intravenous contrast. CONTRAST:  89mL GADAVIST GADOBUTROL 1 MMOL/ML IV SOLN COMPARISON:  11/30/2020 MRI cervical spine, correlation is also made with 11/30/2020 CT cervical, thoracic, and lumbar spine.  FINDINGS: MRI CERVICAL SPINE FINDINGS Alignment: Straightening the normal cervical lordosis. No significant listhesis Vertebrae: Increased T2 signal and enhancement in the C4 vertebral body, which is new from the prior exam, with similar signal at the superior posterior aspect of C5 (series 7, image 10 and series 42, image 9). The endplates appear intact. No abnormal signal in the C4-C5 disc space. Cord: Spinal cord is normal in signal and morphology. No abnormal spinal cord enhancement. Posterior to the spinal cord, there is a circumferential collection the length of the cervical spine, with peripheral contrast enhancement, which measures up to 4 mm at the level of C5 (series 5, image 8), previously 3 mm, and causes moderate thecal sac narrowing. In addition it now measures up to 5 mm at the level C7-T1 (series 5, image 8), previously 3 mm, but without significant thecal sac narrowing. The previously noted greatest dimension the collection at the C2-C3 level now measures 3 mm (series 42, image 8), previously 5 mm when measured similarly. Posterior Fossa, vertebral arteries, paraspinal tissues: Redemonstrated prevertebral effusion, now measuring up to 10 mm at the anterior aspect of C4 (series 7, image 7), previously 7 mm. Status post interval right hemilaminectomy C2-C4 for drainage of the epidural abscess, with a fluid collection in the right paraspinal tissues, measuring up to 1.9 x 2.3 x 5.1 cm in the paraspinous musculature (series 8, image 5 and series 5, image 5), with a connection to a smaller more superficial collection that measures 1.5 x 3.2 x 4.1 cm (series 8, image 1 and series 5, image 8) these collections demonstrate peripheral enhancement but do not enhance internally. Additional surrounding increased T2 signal and enhancement, likely edema. Disc levels: Mild degenerative changes without osseous spinal canal stenosis. Disc bulge at C5-C6, which in conjunction with the epidural collection, causes  moderate thecal sac narrowing. MRI THORACIC SPINE FINDINGS Alignment:  Physiologic. Vertebrae: No acute fracture or suspicious osseous lesion. No abnormal enhancement. T1 and T2 hyperintense lesions, likely benign hemangiomas, most prominent in T1 and T6. Cord: Normal signal and morphology. No abnormal spinal cord enhancement. Circumferential enhancing collection extends from the cervicothoracic junction to approximately the level of T6-T7 (series 41, image 11). This does not cause any significant thecal sac narrowing in the thoracic spine. Paraspinal and other soft tissues: Negative. Disc levels: No significant spinal canal stenosis or neural foraminal narrowing. MRI LUMBAR SPINE FINDINGS Segmentation:  Standard. Alignment:  Physiologic. Vertebrae: Increased T2 signal and diffuse enhancement in the L5 vertebral body and at the superior aspect of S1, with fluid in and enhancement in the disc space (series 33, image 10 and series 38, image 10). Less diffuse increased T2 signal and contrast enhancement in the posterior aspect of L4 (series 38, image 9). Similar abnormal signal is also seen in the spinous processes of L4 and L5. Additional T1 and T2 hyperintense lesions, consistent with benign hemangiomas. Conus medullaris and cauda equina: Conus extends to the L1 level. Conus and cauda equina appear normal. Epidural collection along the dorsal aspect of the thecal sac beginning at the  level of L1 and extending through the sacrum. The collection measures up to 11 mm at the level of L2-L3 (series 38, image 10), where it causes severe thecal sac narrowing. The collection is more circumferential from L3-L4 through the sacrum. Paraspinal and other soft tissues: Loculated fluid collection with peripheral enhancement in the right paraspinous musculature (series 36, image 31, series 39, image 31, series 33, image 7 and series 38, image 7), extending from the superior aspect of L3 to the superior aspect of S1 and measuring up  to 2.6 x 2.0 x 8.0 cm (series 36, image 30 and series 33, image 7), with additional enhancement and increased T2 signal, likely edema, extending from L1-L2 to the inferior edge of the field-of-view. Suspect a small subcentimeter psoas abscess on the right at L1 (series 36, image 11), with additional enhancement and increased T2 signal in the medial aspect of the psoas bilaterally, without focal collection. Disc levels: T12-L1: No significant disc bulge. No spinal canal stenosis or neural foraminal narrowing. L1-L2: Dorsal epidural collection measuring up to 8 mm. Mild to moderate thecal sac narrowing. No significant disc bulge. No neural foraminal narrowing. L2-L3: Dorsal epidural collection measuring up to 11 mm, causing severe thecal sac narrowing. No significant disc bulge. No significant neural foraminal narrowing. L3-L4: Dorsal epidural collection measuring to 7 mm. Moderate facet arthropathy. Moderate to severe thecal sac narrowing. No osseous neural foraminal narrowing. L4-L5: More circumferential epidural collection, which causes moderate to severe thecal sac narrowing. Moderate facet arthropathy. Minimal disc bulge. No significant neural foraminal narrowing. L5-S1: Severe disc height loss and left greater than right disc bulge, with left paracentral annular fissure. Circumferential epidural collection, which causes moderate to severe thecal sac narrowing. Mild facet arthropathy. Mild bilateral neural foraminal narrowing. IMPRESSION: CERVICAL SPINE FINDINGS 1. 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 The collection is decreased in size at the C2-C3 level, but increased in size at C5 and C7, with moderate thecal sac narrowing at C5 from the combination of the collection and a small disc bulge. 2. Status post C2-C4 right hemilaminectomy and decompression, with a fluid collection in the paraspinous musculature  and superficial soft tissues, as well as surrounding edema, favored to be a postoperative seroma. 3. Increased T2 signal and enhancement in the C4 vertebral body and superior posterior aspect of C5, which are new from the prior exam. No abnormal disc signal, however this is concerning for developing osteomyelitis. Attention on follow-up. 4. Increased prevertebral edema. 5. No abnormal spinal cord signal. THORACIC SPINE FINDINGS 1. 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 No significant thecal sac narrowing in the thoracic spine. 2. No abnormal vertebral body enhancement or abnormal spinal cord signal. LUMBAR SPINE FINDINGs 1. 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 2. Increased T2 signal and enhancement in L5 and at the superior aspect of S1, with fluid and enhancement in the disc space, concerning for osteomyelitis discitis. Additional abnormal signal in the posterior aspect of L4 may represent early osteomyelitis. Attention on follow-up. 3. Loculated fluid collection with peripheral enhancement in the right paraspinous musculature, most likely abscesses, extending from the level of L3 to S1, with additional surrounding edema. Possible additional  subcentimeter psoas abscess on the right. 4. No abnormal spinal cord signal or cauda equina enhancement in the lumbar spine. Electronically Signed   By: Merilyn Baba M.D.   On: 12/08/2020 23:36   MR THORACIC SPINE W WO CONTRAST  Result Date: 12/08/2020 CLINICAL DATA:  Known cervical epidural abscess, mid and low back pain, suspect worsening infection EXAM: MRI CERVICAL, THORACIC AND LUMBAR SPINE WITHOUT AND WITH CONTRAST TECHNIQUE: Multiplanar and multiecho pulse sequences of the cervical spine, to include the  craniocervical junction and cervicothoracic junction, and thoracic and lumbar spine, were obtained without and with intravenous contrast. CONTRAST:  81mL GADAVIST GADOBUTROL 1 MMOL/ML IV SOLN COMPARISON:  11/30/2020 MRI cervical spine, correlation is also made with 11/30/2020 CT cervical, thoracic, and lumbar spine. FINDINGS: MRI CERVICAL SPINE FINDINGS Alignment: Straightening the normal cervical lordosis. No significant listhesis Vertebrae: Increased T2 signal and enhancement in the C4 vertebral body, which is new from the prior exam, with similar signal at the superior posterior aspect of C5 (series 7, image 10 and series 42, image 9). The endplates appear intact. No abnormal signal in the C4-C5 disc space. Cord: Spinal cord is normal in signal and morphology. No abnormal spinal cord enhancement. Posterior to the spinal cord, there is a circumferential collection the length of the cervical spine, with peripheral contrast enhancement, which measures up to 4 mm at the level of C5 (series 5, image 8), previously 3 mm, and causes moderate thecal sac narrowing. In addition it now measures up to 5 mm at the level C7-T1 (series 5, image 8), previously 3 mm, but without significant thecal sac narrowing. The previously noted greatest dimension the collection at the C2-C3 level now measures 3 mm (series 42, image 8), previously 5 mm when measured similarly. Posterior Fossa, vertebral arteries, paraspinal tissues: Redemonstrated prevertebral effusion, now measuring up to 10 mm at the anterior aspect of C4 (series 7, image 7), previously 7 mm. Status post interval right hemilaminectomy C2-C4 for drainage of the epidural abscess, with a fluid collection in the right paraspinal tissues, measuring up to 1.9 x 2.3 x 5.1 cm in the paraspinous musculature (series 8, image 5 and series 5, image 5), with a connection to a smaller more superficial collection that measures 1.5 x 3.2 x 4.1 cm (series 8, image 1 and series 5, image 8)  these collections demonstrate peripheral enhancement but do not enhance internally. Additional surrounding increased T2 signal and enhancement, likely edema. Disc levels: Mild degenerative changes without osseous spinal canal stenosis. Disc bulge at C5-C6, which in conjunction with the epidural collection, causes moderate thecal sac narrowing. MRI THORACIC SPINE FINDINGS Alignment:  Physiologic. Vertebrae: No acute fracture or suspicious osseous lesion. No abnormal enhancement. T1 and T2 hyperintense lesions, likely benign hemangiomas, most prominent in T1 and T6. Cord: Normal signal and morphology. No abnormal spinal cord enhancement. Circumferential enhancing collection extends from the cervicothoracic junction to approximately the level of T6-T7 (series 41, image 11). This does not cause any significant thecal sac narrowing in the thoracic spine. Paraspinal and other soft tissues: Negative. Disc levels: No significant spinal canal stenosis or neural foraminal narrowing. MRI LUMBAR SPINE FINDINGS Segmentation:  Standard. Alignment:  Physiologic. Vertebrae: Increased T2 signal and diffuse enhancement in the L5 vertebral body and at the superior aspect of S1, with fluid in and enhancement in the disc space (series 33, image 10 and series 38, image 10). Less diffuse increased T2 signal and contrast enhancement in the posterior aspect of L4 (series 38, image 9).  Similar abnormal signal is also seen in the spinous processes of L4 and L5. Additional T1 and T2 hyperintense lesions, consistent with benign hemangiomas. Conus medullaris and cauda equina: Conus extends to the L1 level. Conus and cauda equina appear normal. Epidural collection along the dorsal aspect of the thecal sac beginning at the level of L1 and extending through the sacrum. The collection measures up to 11 mm at the level of L2-L3 (series 38, image 10), where it causes severe thecal sac narrowing. The collection is more circumferential from L3-L4  through the sacrum. Paraspinal and other soft tissues: Loculated fluid collection with peripheral enhancement in the right paraspinous musculature (series 36, image 31, series 39, image 31, series 33, image 7 and series 38, image 7), extending from the superior aspect of L3 to the superior aspect of S1 and measuring up to 2.6 x 2.0 x 8.0 cm (series 36, image 30 and series 33, image 7), with additional enhancement and increased T2 signal, likely edema, extending from L1-L2 to the inferior edge of the field-of-view. Suspect a small subcentimeter psoas abscess on the right at L1 (series 36, image 11), with additional enhancement and increased T2 signal in the medial aspect of the psoas bilaterally, without focal collection. Disc levels: T12-L1: No significant disc bulge. No spinal canal stenosis or neural foraminal narrowing. L1-L2: Dorsal epidural collection measuring up to 8 mm. Mild to moderate thecal sac narrowing. No significant disc bulge. No neural foraminal narrowing. L2-L3: Dorsal epidural collection measuring up to 11 mm, causing severe thecal sac narrowing. No significant disc bulge. No significant neural foraminal narrowing. L3-L4: Dorsal epidural collection measuring to 7 mm. Moderate facet arthropathy. Moderate to severe thecal sac narrowing. No osseous neural foraminal narrowing. L4-L5: More circumferential epidural collection, which causes moderate to severe thecal sac narrowing. Moderate facet arthropathy. Minimal disc bulge. No significant neural foraminal narrowing. L5-S1: Severe disc height loss and left greater than right disc bulge, with left paracentral annular fissure. Circumferential epidural collection, which causes moderate to severe thecal sac narrowing. Mild facet arthropathy. Mild bilateral neural foraminal narrowing. IMPRESSION: CERVICAL SPINE FINDINGS 1. Redemonstrated circumferential epidural abscess in the cervical spine, extending the length of the cervical spine and into the thoracic  spine. The collection is decreased in size at the C2-C3 level, but increased in size at C5 and C7, with moderate thecal sac narrowing at C5 from the combination of the collection and a small disc bulge. 2. Status post C2-C4 right hemilaminectomy and decompression, with a fluid collection in the paraspinous musculature and superficial soft tissues, as well as surrounding edema, favored to be a postoperative seroma. 3. Increased T2 signal and enhancement in the C4 vertebral body and superior posterior aspect of C5, which are new from the prior exam. No abnormal disc signal, however this is concerning for developing osteomyelitis. Attention on follow-up. 4. Increased prevertebral edema. 5. No abnormal spinal cord signal. THORACIC SPINE FINDINGS 1. 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 No significant thecal sac narrowing in the thoracic spine. 2. No abnormal vertebral body enhancement or abnormal spinal cord signal. LUMBAR SPINE FINDINGs 1. 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 2. Increased T2 signal and enhancement in L5 and at the superior aspect of S1, with fluid and  enhancement in the disc space, concerning for osteomyelitis discitis. Additional abnormal signal in the posterior aspect of L4 may represent early osteomyelitis. Attention on follow-up. 3. Loculated fluid collection with peripheral enhancement in the right paraspinous musculature, most likely abscesses, extending from the level of L3 to S1, with additional surrounding edema. Possible additional subcentimeter psoas abscess on the right. 4. No abnormal spinal cord signal or cauda equina enhancement in the lumbar spine. Electronically Signed   By: Merilyn Baba M.D.   On: 12/08/2020 23:36   MR  Lumbar Spine W Wo Contrast  Result Date: 12/08/2020 CLINICAL DATA:  Known cervical epidural abscess, mid and low back pain, suspect worsening infection EXAM: MRI CERVICAL, THORACIC AND LUMBAR SPINE WITHOUT AND WITH CONTRAST TECHNIQUE: Multiplanar and multiecho pulse sequences of the cervical spine, to include the craniocervical junction and cervicothoracic junction, and thoracic and lumbar spine, were obtained without and with intravenous contrast. CONTRAST:  5mL GADAVIST GADOBUTROL 1 MMOL/ML IV SOLN COMPARISON:  11/30/2020 MRI cervical spine, correlation is also made with 11/30/2020 CT cervical, thoracic, and lumbar spine. FINDINGS: MRI CERVICAL SPINE FINDINGS Alignment: Straightening the normal cervical lordosis. No significant listhesis Vertebrae: Increased T2 signal and enhancement in the C4 vertebral body, which is new from the prior exam, with similar signal at the superior posterior aspect of C5 (series 7, image 10 and series 42, image 9). The endplates appear intact. No abnormal signal in the C4-C5 disc space. Cord: Spinal cord is normal in signal and morphology. No abnormal spinal cord enhancement. Posterior to the spinal cord, there is a circumferential collection the length of the cervical spine, with peripheral contrast enhancement, which measures up to 4 mm at the level of C5 (series 5, image 8), previously 3 mm, and causes moderate thecal sac narrowing. In addition it now measures up to 5 mm at the level C7-T1 (series 5, image 8), previously 3 mm, but without significant thecal sac narrowing. The previously noted greatest dimension the collection at the C2-C3 level now measures 3 mm (series 42, image 8), previously 5 mm when measured similarly. Posterior Fossa, vertebral arteries, paraspinal tissues: Redemonstrated prevertebral effusion, now measuring up to 10 mm at the anterior aspect of C4 (series 7, image 7), previously 7 mm. Status post interval right hemilaminectomy C2-C4 for drainage of the  epidural abscess, with a fluid collection in the right paraspinal tissues, measuring up to 1.9 x 2.3 x 5.1 cm in the paraspinous musculature (series 8, image 5 and series 5, image 5), with a connection to a smaller more superficial collection that measures 1.5 x 3.2 x 4.1 cm (series 8, image 1 and series 5, image 8) these collections demonstrate peripheral enhancement but do not enhance internally. Additional surrounding increased T2 signal and enhancement, likely edema. Disc levels: Mild degenerative changes without osseous spinal canal stenosis. Disc bulge at C5-C6, which in conjunction with the epidural collection, causes moderate thecal sac narrowing. MRI THORACIC SPINE FINDINGS Alignment:  Physiologic. Vertebrae: No acute fracture or suspicious osseous lesion. No abnormal enhancement. T1 and T2 hyperintense lesions, likely benign hemangiomas, most prominent in T1 and T6. Cord: Normal signal and morphology. No abnormal spinal cord enhancement. Circumferential enhancing collection extends from the cervicothoracic junction to approximately the level of T6-T7 (series 41, image 11). This does not cause any significant thecal sac narrowing in the thoracic spine. Paraspinal and other soft tissues: Negative. Disc levels: No significant spinal canal stenosis or neural foraminal narrowing. MRI LUMBAR SPINE FINDINGS Segmentation:  Standard. Alignment:  Physiologic. Vertebrae:  Increased T2 signal and diffuse enhancement in the L5 vertebral body and at the superior aspect of S1, with fluid in and enhancement in the disc space (series 33, image 10 and series 38, image 10). Less diffuse increased T2 signal and contrast enhancement in the posterior aspect of L4 (series 38, image 9). Similar abnormal signal is also seen in the spinous processes of L4 and L5. Additional T1 and T2 hyperintense lesions, consistent with benign hemangiomas. Conus medullaris and cauda equina: Conus extends to the L1 level. Conus and cauda equina  appear normal. Epidural collection along the dorsal aspect of the thecal sac beginning at the level of L1 and extending through the sacrum. The collection measures up to 11 mm at the level of L2-L3 (series 38, image 10), where it causes severe thecal sac narrowing. The collection is more circumferential from L3-L4 through the sacrum. Paraspinal and other soft tissues: Loculated fluid collection with peripheral enhancement in the right paraspinous musculature (series 36, image 31, series 39, image 31, series 33, image 7 and series 38, image 7), extending from the superior aspect of L3 to the superior aspect of S1 and measuring up to 2.6 x 2.0 x 8.0 cm (series 36, image 30 and series 33, image 7), with additional enhancement and increased T2 signal, likely edema, extending from L1-L2 to the inferior edge of the field-of-view. Suspect a small subcentimeter psoas abscess on the right at L1 (series 36, image 11), with additional enhancement and increased T2 signal in the medial aspect of the psoas bilaterally, without focal collection. Disc levels: T12-L1: No significant disc bulge. No spinal canal stenosis or neural foraminal narrowing. L1-L2: Dorsal epidural collection measuring up to 8 mm. Mild to moderate thecal sac narrowing. No significant disc bulge. No neural foraminal narrowing. L2-L3: Dorsal epidural collection measuring up to 11 mm, causing severe thecal sac narrowing. No significant disc bulge. No significant neural foraminal narrowing. L3-L4: Dorsal epidural collection measuring to 7 mm. Moderate facet arthropathy. Moderate to severe thecal sac narrowing. No osseous neural foraminal narrowing. L4-L5: More circumferential epidural collection, which causes moderate to severe thecal sac narrowing. Moderate facet arthropathy. Minimal disc bulge. No significant neural foraminal narrowing. L5-S1: Severe disc height loss and left greater than right disc bulge, with left paracentral annular fissure. Circumferential  epidural collection, which causes moderate to severe thecal sac narrowing. Mild facet arthropathy. Mild bilateral neural foraminal narrowing. IMPRESSION: CERVICAL SPINE FINDINGS 1. 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 The collection is decreased in size at the C2-C3 level, but increased in size at C5 and C7, with moderate thecal sac narrowing at C5 from the combination of the collection and a small disc bulge. 2. Status post C2-C4 right hemilaminectomy and decompression, with a fluid collection in the paraspinous musculature and superficial soft tissues, as well as surrounding edema, favored to be a postoperative seroma. 3. Increased T2 signal and enhancement in the C4 vertebral body and superior posterior aspect of C5, which are new from the prior exam. No abnormal disc signal, however this is concerning for developing osteomyelitis. Attention on follow-up. 4. Increased prevertebral edema. 5. No abnormal spinal cord signal. THORACIC SPINE FINDINGS 1. 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 No significant thecal sac narrowing in the thoracic spine. 2. No abnormal vertebral body enhancement or abnormal spinal cord signal. LUMBAR SPINE FINDINGs 1. Epidural abscess in the lumbar  spine from the level of L1 through the sacrum, which is predominately dorsal from L1 through L3-L4, then more circumferential, causing up to severe thecal sac narrowing at L2-L3, where it measures up to 11 mm. 2. Increased T2 signal and enhancement in L5 and at the superior aspect of S1, with fluid and enhancement in the disc space, concerning for osteomyelitis discitis. Additional abnormal signal in the posterior aspect of L4 may represent early osteomyelitis. Attention on follow-up. 3. Loculated fluid collection with peripheral enhancement in the right paraspinous musculature, most  likely abscesses, extending from the level of L3 to S1, with additional surrounding edema. Possible additional subcentimeter psoas abscess on the right. 4. No abnormal spinal cord signal or cauda equina enhancement in the lumbar spine. Electronically Signed   By: Merilyn Baba M.D.   On: 12/08/2020 23:36   DG C-Arm 1-60 Min-No Report  Result Date: 12/10/2020 Fluoroscopy was utilized by the requesting physician.  No radiographic interpretation.     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

## 2020-12-10 NOTE — Progress Notes (Signed)
OT Cancellation Note  Patient Details Name: Caleb Fisher MRN: [id number] DOB: 1975/11/14   Cancelled Treatment:    Reason Eval/Treat Not Completed: Patient at procedure or test/ unavailable Pt OTF for lumbar laminectomy at this time. Will f/u at later date/time as able if approved to carryover services by neurosurgery. Thank you.  Gerrianne Scale, [place], OTR/L ascom [phone] 12/10/20, 1:39 PM

## 2020-12-10 NOTE — Anesthesia Procedure Notes (Signed)
Procedure Name: Intubation Date/Time: 12/10/2020 2:36 PM Performed by: Rolla Plate, CRNA Pre-anesthesia Checklist: Patient identified, Patient being monitored, Timeout performed, Emergency Drugs available and Suction available Patient Re-evaluated:Patient Re-evaluated prior to induction Oxygen Delivery Method: Circle system utilized Preoxygenation: Pre-oxygenation with 100% oxygen Induction Type: IV induction Ventilation: Mask ventilation without difficulty Laryngoscope Size: Mac and 4 Grade View: Grade I Tube type: Oral Tube size: 7.5 mm Number of attempts: 1 Airway Equipment and Method: Stylet and Video-laryngoscopy Placement Confirmation: ETT inserted through vocal cords under direct vision, positive ETCO2 and breath sounds checked- equal and bilateral Secured at: 22 cm Tube secured with: Tape Dental Injury: Teeth and Oropharynx as per pre-operative assessment

## 2020-12-11 ENCOUNTER — Encounter: Payer: Self-pay | Admitting: Neurosurgery

## 2020-12-11 LAB — COMPREHENSIVE METABOLIC PANEL
ALT: 44 U/L (ref 0–44)
AST: 42 U/L — ABNORMAL HIGH (ref 15–41)
Albumin: 1.7 g/dL — ABNORMAL LOW (ref 3.5–5.0)
Alkaline Phosphatase: 124 U/L (ref 38–126)
Anion gap: 6 (ref 5–15)
BUN: 18 mg/dL (ref 6–20)
CO2: 26 mmol/L (ref 22–32)
Calcium: 7.6 mg/dL — ABNORMAL LOW (ref 8.9–10.3)
Chloride: 103 mmol/L (ref 98–111)
Creatinine, Ser: 0.58 mg/dL — ABNORMAL LOW (ref 0.61–1.24)
GFR, Estimated: >60 mL/min (ref >60)
Glucose, Bld: 125 mg/dL — ABNORMAL HIGH (ref 70–99)
Potassium: 3.8 mmol/L (ref 3.5–5.1)
Sodium: 135 mmol/L (ref 135–145)
Total Bilirubin: 0.7 mg/dL (ref 0.3–1.2)
Total Protein: 5 g/dL — ABNORMAL LOW (ref 6.5–8.1)

## 2020-12-11 LAB — CBC
HCT: 24 % — ABNORMAL LOW (ref 39.0–52.0)
Hemoglobin: 8 g/dL — ABNORMAL LOW (ref 13.0–17.0)
MCH: 29.7 pg (ref 26.0–34.0)
MCHC: 33.3 g/dL (ref 30.0–36.0)
MCV: 89.2 fL (ref 80.0–100.0)
Platelets: 762 10*3/uL — ABNORMAL HIGH (ref 150–400)
RBC: 2.69 MIL/uL — ABNORMAL LOW (ref 4.22–5.81)
RDW: 15.1 % (ref 11.5–15.5)
WBC: 14.5 10*3/uL — ABNORMAL HIGH (ref 4.0–10.5)
nRBC: 0 % (ref 0.0–0.2)

## 2020-12-11 NOTE — Progress Notes (Signed)
Attending Progress Note  History: Caleb Fisher is a 45 y.o male presenting with AMS and sepsis. Blood cultures showed MRSA bacteremia. 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 Pt underwent C2-4 right hemilaminectomies for evacuation of epidural on 12/01/20. He was taken for a lumbar decompression on 12/10/2020 for continued infection despite antibiotics.    24 Hour events 11/26: Patient having expected back pain but denies any leg symptoms. Drain output has been minimal. He is tolerating a diet and voiding   Physical Exam: Vitals:   12/11/20 0825 12/11/20 1559  BP: 136/85 134/81  Pulse: 64 73  Resp: 16 20  Temp: 97.7 F (36.5 C) 97.7 F (36.5 C)  SpO2: 97% 95%     CNI 5/5 in upper and lower extremities Sensation intact Incision - c/d/i  Data:  Recent Labs  Lab 12/08/20 0238 12/09/20 0255 12/11/20 0515  NA 132* 132* 135  K 3.5 3.3* 3.8  CL 102 103 103  CO2 25 25 26   BUN 17 20 18   CREATININE 0.62 0.59* 0.58*  GLUCOSE 101* 90 125*  CALCIUM 7.3* 7.3* 7.6*    Recent Labs  Lab 12/11/20 0515  AST 42*  ALT 44  ALKPHOS 124      Recent Labs  Lab 12/08/20 0238 12/09/20 0255 12/11/20 0515  WBC 21.0* 20.1* 14.5*  HGB 8.5* 8.4* 8.0*  HCT 25.3* 24.3* 24.0*  PLT 367 514* 762*    No results for input(s): APTT, INR in the last 168 hours.         Assessment/Plan:  Caleb Fisher is a 45 y.o presenting with MRSA bacteremia and a cervical epidural abscess status post C2-4 right hemilaminectomies for evacuation of epidural abscess on 12/01/20 and L2/3 laminectomies for epidural abscess on 12/10/20.  - ABX coverage per ID recommendations - no activity restrictions from a neurosurgical standpoint - q4 hr neuro checks - Continue DVT PPX - Remainder of plan per primary team and ID - Neurosurgery will follow along, plan for drain removal 11/28  Deetta Perla, MD Department of Neurosurgery

## 2020-12-11 NOTE — Progress Notes (Signed)
PROGRESS NOTE  Caleb Fisher    DOB: 29-Jan-1975, 45 y.o.  STM:[id number]  PCP: Pcp, No   Code Status: Full Code   DOA: 11/30/2020   LOS: 11  Brief Narrative of Current Hospitalization  Caleb Fisher is a 45 y.o. male with a PMH significant for significant tobacco use. They presented from home to the ED on 11/30/2020 with several days of altered mental status and increased back/neck pain. In the ED, it was found that they had sepsis from unknown source. They were treated with IV fluids and antibiotics.  Neurology was consulted for LP.  Subsequently, patient was found to have MRSA bacteremia.  Imaging revealed cervical epidural abscesses involving C2-C5. He had R 5th digit abscess drained.  Repeat spinal imaging showed more progressive infection including worsening of cervical and involvement of thoracic/lumbar vertebrae.  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 Epidural abscess of thoracic spine. Possible osteomyelitis in cervical spine, new from previous imaging.  POA as evidenced by tachycardia, tachypnea, fevers, acute encephalopathy consistent with organ dysfunction and severe sepsis. Sepsis physiology resolved. Wound cultures positive for MRSA sensitive to vancomycin. Presumed source is excoriations on forearms.  Repeat blood cultures collected 11/19 NGTD.  WBC improved to 14.5. afebrile. Repeat blood cultures today. -ID following, appreciate recommendations  - Repeat BcCx 11/26 and if negative can order PICC  - currently on vancomycin and ceftaroline. Will need 6 weeks total Abx from total spine clean out.    - (11/15-12/27) -Neurosurgery following, appreciate recommendations  - s/p hemilaminectomy with washout 11/16  - lumbar laminectomy 11/25  - monitor drain - consult cardiology for TEE when able to tolerate procedure - PT/OT  Mild hypokalemia- resolved. K+ 3.8 - replete PRN - BMP am  Thrombocytopenia- resolved.   AKI- resolved.  - avoid nephrotoxic agents  Urinary retention- has repeat urinary hesitancy from anesthesia yesterday but was able to urinate overnight and pt would like to trial without foley as long as possible.  - continue increased dose of flomax  - strict I/O - bladder scans q4hr  DVT prophylaxis: Place and maintain sequential compression device Start: 12/09/20 1404 SCD's Start: 12/01/20 1907   Diet:  Diet Orders (From admission, onward)     Start     Ordered   12/10/20 1849  Diet full liquid Room service appropriate? Yes; Fluid consistency: Thin  Diet effective now       Question Answer Comment  Room service appropriate? Yes   Fluid consistency: Thin      12/10/20 1848            Subjective 12/11/20    Pt reports feeling better. He still has back pain but seems improved. Endorses tolerating full diet overnight and having urine output with difficulty initiating stream.  Disposition Plan & Communication  Patient status: Inpatient  Admitted From:  Home Disposition: Home Anticipated discharge date: TBD  Family Communication: wife at bedside  Consults, Procedures, Significant Events  Consultants:  Neurosurgery ID Neurology Cardiology  General surgery  Procedures/significant events:  hemilaminectomy with washout 11/16 Lumbar clean out  11/25  Antimicrobials:  Anti-infectives (From admission, onward)    Start     Dose/Rate Route Frequency Ordered Stop   12/10/20 1554  vancomycin (VANCOCIN) powder  Status:  Discontinued          As needed 12/10/20 1554 12/10/20 1616   12/10/20 1359  vancomycin (VANCOCIN) 1-5 GM/200ML-% IVPB       Note to Pharmacy: Norton Blizzard  : cabinet override      12/10/20 1359 12/10/20 1817   12/07/20 1800  vancomycin (VANCOREADY) IVPB 1000 mg/200 mL        1,000 mg 200 mL/hr over 60 Minutes Intravenous Every 8 hours 12/07/20 1630     12/03/20 1000  vancomycin (VANCOREADY) IVPB 1000 mg/200 mL  Status:  Discontinued        1,000 mg 200 mL/hr over 60 Minutes Intravenous Every 12 hours 12/03/20 0850 12/03/20 0850   12/03/20 1000  vancomycin (VANCOREADY) IVPB 1500 mg/300 mL  Status:  Discontinued        1,500 mg 150 mL/hr over 120 Minutes Intravenous Every 12 hours 12/03/20 0850 12/07/20 1630   12/01/20 1630  ceftaroline (TEFLARO) 600 mg in sodium chloride 0.9 % 100 mL IVPB        600 mg 100 mL/hr over 60 Minutes Intravenous Every 8 hours 12/01/20 1533     12/01/20 0800  cefTRIAXone (ROCEPHIN) 2 g in sodium chloride 0.9 % 100 mL IVPB  Status:  Discontinued        2 g 200 mL/hr over 30 Minutes Intravenous Every 24 hours 11/30/20 2311 12/01/20 0032   12/01/20 0700  vancomycin (VANCOREADY) IVPB 1000 mg/200 mL  Status:  Discontinued        1,000 mg 200 mL/hr over 60 Minutes Intravenous Every 12 hours 11/30/20 2319 12/03/20 0850   12/01/20 0700  ceFEPIme (MAXIPIME) 2 g in sodium chloride 0.9 % 100 mL IVPB  Status:  Discontinued        2 g 200 mL/hr over 30 Minutes Intravenous Every 8 hours 12/01/20 0054 12/01/20 0128   12/01/20 0618  metroNIDAZOLE (FLAGYL) IVPB 500 mg  Status:  Discontinued        500 mg 100 mL/hr over 60 Minutes Intravenous Every 6 hours 12/01/20 0031 12/01/20 1237   12/01/20 0600  cefTRIAXone (ROCEPHIN) 2 g in sodium chloride 0.9 % 100 mL IVPB  Status:  Discontinued        2  g 200 mL/hr over 30 Minutes Intravenous Every 12 hours 12/01/20 0035 12/01/20 0049   12/01/20 0045  cefTRIAXone (ROCEPHIN) 2 g in sodium chloride 0.9 % 100 mL IVPB  Status:  Discontinued        2 g 200 mL/hr over 30 Minutes Intravenous Every 12 hours 12/01/20 0032 12/01/20 0035   12/01/20 0000  metroNIDAZOLE (FLAGYL) IVPB 500 mg  Status:  Discontinued        500 mg 100 mL/hr over 60 Minutes Intravenous Every 12 hours 11/30/20 2311 12/01/20 0031   11/30/20 2300  ceFEPIme (MAXIPIME) 2 g in sodium chloride 0.9 % 100 mL IVPB  Status:  Discontinued        2 g 200 mL/hr over 30 Minutes Intravenous Every 8 hours 11/30/20 1816 12/01/20 0000   11/30/20 1930  acyclovir (ZOVIRAX)  725 mg in dextrose 5 % 150 mL IVPB  Status:  Discontinued        10 mg/kg  72.6 kg 164.5 mL/hr over 60 Minutes Intravenous Every 8 hours 11/30/20 1915 12/01/20 0032   11/30/20 1500  vancomycin (VANCOREADY) IVPB 1750 mg/350 mL        1,750 mg 175 mL/hr over 120 Minutes Intravenous  Once 11/30/20 1443 11/30/20 2200   11/30/20 1445  ceFEPIme (MAXIPIME) 2 g in sodium chloride 0.9 % 100 mL IVPB        2 g 200 mL/hr over 30 Minutes Intravenous  Once 11/30/20 1436 11/30/20 1728   11/30/20 1445  metroNIDAZOLE (FLAGYL) IVPB 500 mg        500 mg 100 mL/hr over 60 Minutes Intravenous  Once 11/30/20 1436 11/30/20 1835   11/30/20 1445  vancomycin (VANCOCIN) IVPB 1000 mg/200 mL premix  Status:  Discontinued        1,000 mg 200 mL/hr over 60 Minutes Intravenous  Once 11/30/20 1436 11/30/20 1443       Objective   Vitals:   12/10/20 1828 12/10/20 1832 12/10/20 2114 12/11/20 0400  BP: 116/76  108/68 132/69  Pulse: 61 (!) 59 66 71  Resp:  18 17 16   Temp:  98.3 F (36.8 C) (!) 97.4 F (36.3 C) 97.8 F (36.6 C)  TempSrc:  Oral  Axillary  SpO2:  96% 99% 98%  Weight:      Height:        Intake/Output Summary (Last 24 hours) at 12/11/2020 0705 Last data filed at 12/11/2020 0148 Gross per 24 hour  Intake 900 ml  Output 1100  ml  Net -200 ml    Filed Weights   11/30/20 1311 11/30/20 2216  Weight: 72.6 kg 73.4 kg    Patient BMI: Body mass index is 23.9 kg/m.   Physical Exam: General: awake, alert, NAD HEENT: atraumatic, clear conjunctiva, anicteric sclera, dry mucus membranes, hearing grossly normal Cardiovascular: quick capillary refill  Nervous: A&O x3. no gross focal neurologic deficits, normal speech Extremities: moves all equally, no edema, normal tone Skin: dry, intact, normal temperature, normal color, excoriations on forearms improved from admission. Psychiatry: flat mood and affect  Labs   CBC    Component Value Date/Time   WBC 14.5 (H) 12/11/2020 0515   RBC 2.69 (L) 12/11/2020 0515   HGB 8.0 (L) 12/11/2020 0515   HCT 24.0 (L) 12/11/2020 0515   PLT 762 (H) 12/11/2020 0515   MCV 89.2 12/11/2020 0515   MCH 29.7 12/11/2020 0515   MCHC 33.3 12/11/2020 0515   RDW 15.1 12/11/2020 0515   LYMPHSABS 2.0 12/06/2020 0436   MONOABS 1.3 (H) 12/06/2020 0436   EOSABS 0.0 12/06/2020 0436   BASOSABS 0.0 12/06/2020 0436   BMP Latest Ref Rng & Units 12/11/2020 12/09/2020 12/08/2020  Glucose 70 - 99 mg/dL 125(H) 90 101(H)  BUN 6 - 20 mg/dL 18 20 17   Creatinine 0.61 - 1.24 mg/dL 0.58(L) 0.59(L) 0.62  Sodium 135 - 145 mmol/L 135 132(L) 132(L)  Potassium 3.5 - 5.1 mmol/L 3.8 3.3(L) 3.5  Chloride 98 - 111 mmol/L 103 103 102  CO2 22 - 32 mmol/L 26 25 25   Calcium 8.9 - 10.3 mg/dL 7.6(L) 7.3(L) 7.3(L)   Imaging Studies  DG Lumbar Spine 2-3 Views  Result Date: 12/10/2020 CLINICAL DATA:  Lumbar laminectomy for epidural abscess EXAM: LUMBAR SPINE - 2-3 VIEW COMPARISON:  Lumbar MRI 12/08/2020 FINDINGS: C-arm images were obtained of the lateral lumbar spine. These  reveal localization of the spinal canal at the L3-4 level. IMPRESSION: Instrument overlying the spinal canal at the L3-4 level. Electronically Signed   By: Franchot Gallo M.D.   On: 12/10/2020 15:49   DG C-Arm 1-60 Min-No Report  Result Date:  12/10/2020 Fluoroscopy was utilized by the requesting physician.  No radiographic interpretation.   Medications   Scheduled Meds:  amLODipine  10 mg Oral Daily   Chlorhexidine Gluconate Cloth  6 each Topical Q0600   feeding supplement  237 mL Oral TID BM   losartan  50 mg Oral Daily   multivitamin with minerals  1 tablet Oral Daily   oxyCODONE  5 mg Oral Q6H   polyethylene glycol  17 g Oral Daily   tamsulosin  0.8 mg Oral QPC breakfast   No recently discontinued medications to reconcile  LOS: 11 days   Time spent: >66min  Garet Hooton L Bret Vanessen, DO [facility] 12/11/2020, 7:05 AM   Please refer to amion to contact the [facility] Attending or Consulting provider for this pt  [email or web address] Available by Epic secure chat 7AM-7PM. If 7PM-7AM, please contact night-coverage

## 2020-12-11 NOTE — Progress Notes (Signed)
OT Cancellation Note  Patient Details Name: Cote Mayabb MRN: [id number] DOB: Aug 08, 1975   Cancelled Treatment:    Reason Eval/Treat Not Completed: Other (comment) Pt underwent lumbar laminectomy with evacuation of epidural abscess on 11/25 under GET. No continue at transfer orders or new OT orders in place at this time. Per protocol, will complete previous order from 11/17 and await new OT orders as well as guidance from neurosurgery team regarding any post-operative precautions/restrictions before resuming services. Thank you.  Gerrianne Scale, [place], OTR/L ascom [phone] 12/11/20, 9:48 AM

## 2020-12-11 NOTE — Progress Notes (Signed)
PT Cancellation Note  Patient Details Name: Caleb Fisher MRN: [id number] DOB: 16-May-1975   Cancelled Treatment:    Reason Eval/Treat Not Completed: Other (comment) Pt underwent lumbar laminectomy with evacuation of epidural abscess on 11/25 under GET. No continue at transfer orders or new PT orders in place at this time. Per protocol, will complete previous order from 11/17 and await new PT orders as well as guidance from neurosurgery team regarding any post-operative precautions/restrictions before resuming services. Thank you.   Lavone Nian, PT, DPT 12/11/20, 10:00 AM   Waunita Schooner 12/11/2020, 9:59 AM

## 2020-12-12 LAB — CBC
HCT: 23.6 % — ABNORMAL LOW (ref 39.0–52.0)
Hemoglobin: 7.7 g/dL — ABNORMAL LOW (ref 13.0–17.0)
MCH: 28.9 pg (ref 26.0–34.0)
MCHC: 32.6 g/dL (ref 30.0–36.0)
MCV: 88.7 fL (ref 80.0–100.0)
Platelets: 748 10*3/uL — ABNORMAL HIGH (ref 150–400)
RBC: 2.66 MIL/uL — ABNORMAL LOW (ref 4.22–5.81)
RDW: 15.1 % (ref 11.5–15.5)
WBC: 12.5 10*3/uL — ABNORMAL HIGH (ref 4.0–10.5)
nRBC: 0 % (ref 0.0–0.2)

## 2020-12-12 MED ORDER — HYDROMORPHONE HCL 2 MG PO TABS
1.0000 mg | ORAL_TABLET | ORAL | Status: DC | PRN
  Administered 2020-12-12 – 2020-12-15 (×16): 2 mg via ORAL
  Filled 2020-12-12 (×16): qty 1

## 2020-12-12 MED ORDER — MORPHINE SULFATE (PF) 2 MG/ML IV SOLN
1.0000 mg | Freq: Three times a day (TID) | INTRAVENOUS | Status: DC | PRN
Start: 2020-12-12 — End: 2020-12-13
  Administered 2020-12-12: 12:00:00 2 mg via INTRAVENOUS
  Filled 2020-12-12: qty 1

## 2020-12-12 NOTE — TOC Progression Note (Signed)
Transition of Care [facility]) - Progression Note    Patient Details  Name: Caleb Fisher MRN: [id number] Date of Birth: 08-25-1975  Transition of Care [facility]) CM/SW [place], RN Phone Number: 12/12/2020, 9:09 AM  Clinical Narrative:    Patient underwent Spinal surgery, PT to eval and provide recommendation, the patient is already set up wo have IV ABX at home with [facility], set up with [facility] Lemmings for PT and [place] for Nursing, TOC to continue to follow        Expected Discharge Plan and Services                                                 Social Determinants of Health (SDOH) Interventions    Readmission Risk Interventions No flowsheet data found.

## 2020-12-12 NOTE — Evaluation (Signed)
Physical Therapy Evaluation Patient Details Name: Caleb Fisher MRN: [id number] DOB: [birth year] Today's Date: 12/12/2020  History of Present Illness  45 year old male with no past medical history other than tobacco abuse, presented to the ED from home on 11/30/2020 with altered mental status, increased back pain and new onset neck pain, and frequent falls. MRI of the spine shows epidural cervical abscess around C2-C5.  Pt underwent C2-4 right hemilaminectomies for evacuation of epidural on 12/01/20. repeat laminectomy 12/10/20.   Clinical Impression  Patient received in bed, per RN just got pain medicine. He is reporting al lot of pain 9/10. Agreeable to try some mobility. Required mod assist for supine><sit. Due to pain level and reported dizziness with sitting up at edge of bed, no further mobility attempted this session. Patient will continue to benefit from frequent PT to improve functional independence for hopeful return home. I anticipate that if  he can get his pain managed, he will progress quickly.         Recommendations for follow up therapy are one component of a multi-disciplinary discharge planning process, led by the attending physician.  Recommendations may be updated based on patient status, additional functional criteria and insurance authorization.  Follow Up Recommendations Home health PT    Assistance Recommended at Discharge Frequent or constant Supervision/Assistance  Functional Status Assessment Patient has had a recent decline in their functional status and demonstrates the ability to make significant improvements in function in a reasonable and predictable amount of time.  Equipment Recommendations  Rolling walker (2 wheels)    Recommendations for Other Services       Precautions / Restrictions Precautions Precautions: Fall Restrictions Weight Bearing Restrictions: No Other Position/Activity Restrictions: s/p C2-4 right hemilaminectomies - per MD notes no  activity restrictions, no brace needed      Mobility  Bed Mobility Overal bed mobility: Needs Assistance Bed Mobility: Supine to Sit;Sit to Supine     Supine to sit: Mod assist;HOB elevated Sit to supine: Mod assist;HOB elevated   General bed mobility comments: mod assist to raise trunk to seated position. Increased pain with mobility. required assist to bring LEs on and off bed. Reports dizziness upon sitting edge of bed,    Transfers                   General transfer comment: not able to attempt this date. Dizzy and painful    Ambulation/Gait               General Gait Details: unable to attempt  Stairs            Wheelchair Mobility    Modified Rankin (Stroke Patients Only)       Balance Overall balance assessment: Needs assistance Sitting-balance support: Feet supported Sitting balance-Leahy Scale: Fair Sitting balance - Comments: UE support, painful and reports dizziness                                     Pertinent Vitals/Pain Pain Assessment: 0-10 Pain Score: 9  Pain Location: mid back where drain is and cervical spine Pain Descriptors / Indicators: Burning;Discomfort;Grimacing;Guarding Pain Intervention(s): Limited activity within patient's tolerance;Monitored during session;Premedicated before session;Repositioned    Home Living Family/patient expects to be discharged to:: Private residence Living Arrangements: Spouse/significant other Available Help at Discharge: Family;Available 24 hours/day Type of Home: House Home Access: Stairs to enter Entrance Stairs-Rails: Can reach both;Left;Right Entrance Stairs-Number of  Steps: 3   Home Layout: One level Home Equipment: None      Prior Function Prior Level of Function : Independent/Modified Independent             Mobility Comments: At baseline, pt is an Clinical biochemist, 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       Hand Dominance   Dominant Hand: Right     Extremity/Trunk Assessment   Upper Extremity Assessment Upper Extremity Assessment: Defer to OT evaluation    Lower Extremity Assessment Lower Extremity Assessment: Generalized weakness    Cervical / Trunk Assessment Cervical / Trunk Assessment: Neck Surgery  Communication   Communication: No difficulties  Cognition Arousal/Alertness: Awake/alert Behavior During Therapy: WFL for tasks assessed/performed Overall Cognitive Status: Within Functional Limits for tasks assessed                                          General Comments      Exercises     Assessment/Plan    PT Assessment Patient needs continued PT services  PT Problem List Decreased strength;Decreased mobility;Decreased activity tolerance;Pain;Decreased knowledge of use of DME       PT Treatment Interventions Therapeutic activities;Gait training;DME instruction;Therapeutic exercise;Stair training;Functional mobility training;Patient/family education    PT Goals (Current goals can be found in the Care Plan section)  Acute Rehab PT Goals Patient Stated Goal: to get back to normal PT Goal Formulation: With patient Time For Goal Achievement: 12/31/20 Potential to Achieve Goals: Fair    Frequency Min 2X/week   Barriers to discharge Inaccessible home environment      Co-evaluation               AM-PAC PT "6 Clicks" Mobility  Outcome Measure Help needed turning from your back to your side while in a flat bed without using bedrails?: A Lot Help needed moving from lying on your back to sitting on the side of a flat bed without using bedrails?: A Lot Help needed moving to and from a bed to a chair (including a wheelchair)?: Total Help needed standing up from a chair using your arms (e.g., wheelchair or bedside chair)?: Total Help needed to walk in hospital room?: Total Help needed climbing 3-5 steps with a railing? : Total 6 Click Score: 8    End of Session   Activity Tolerance:  Patient limited by fatigue;Patient limited by pain Patient left: in bed;with call bell/phone within reach Nurse Communication: Mobility status PT Visit Diagnosis: Other abnormalities of gait and mobility (R26.89);Pain Pain - part of body:  (spine)    Time: 1308-6578 PT Time Calculation (min) (ACUTE ONLY): 20 min   Charges:   PT Evaluation $PT Re-evaluation: 1 Re-eval          Shainna Faux, PT, GCS 12/12/20,11:08 AM

## 2020-12-12 NOTE — Progress Notes (Signed)
PROGRESS NOTE  Caleb Fisher    DOB: 1975-09-10, 45 y.o.  GYJ:[id number]  PCP: Pcp, No   Code Status: Full Code   DOA: 11/30/2020   LOS: 12  Brief Narrative of Current Hospitalization  Caleb Fisher is a 45 y.o. male with a PMH significant for significant tobacco use. They presented from home to the ED on 11/30/2020 with several days of altered mental status and increased back/neck pain. In the ED, it was found that they had sepsis from unknown source. They were treated with IV fluids and antibiotics.  Neurology was consulted for LP.  Subsequently, patient was found to have MRSA bacteremia.  Imaging revealed cervical epidural abscesses involving C2-C5. He had R 5th digit abscess drained.  Repeat spinal imaging showed more progressive infection including worsening of cervical and involvement of thoracic/lumbar vertebrae.  11/16, and 11/25 neurosurgical procedures for laminectomies   12/12/20 -improved  Assessment & Plan  Active Problems:   Encephalopathy acute   Altered mental status   MRSA bacteremia   Abscess in epidural space of cervical spine   Hypertensive urgency   Hypokalemia   Thrombocytopenia (HCC)   Acute metabolic encephalopathy   Severe sepsis (HCC)   Acute urinary retention   Abnormal LFTs   Acute hypercapnic respiratory failure ([place])   Fall   Acute respiratory failure with hypoxia (HCC)   Epidural abscess   Transaminitis  Severe sepsis MRSA bacteremia Cervical, Lumbar epidural spinal abscess Septic pulmonary emboli POA as evidenced by tachycardia, tachypnea, fevers, acute encephalopathy consistent with organ dysfunction and severe sepsis. Sepsis physiology resolved. Wound cultures positive for MRSA sensitive to vancomycin. Presumed source is excoriations on forearms. Repeat blood cultures collected 11/19 NGTD.  WBC improved to 14.5>12.5. afebrile. Repeat blood cultures 11/26 NGTD -ID following, appreciate recommendations  - Repeat BcCx 11/26 and if negative can  order PICC  - currently on vancomycin and ceftaroline. Will need 6 weeks total Abx from total spine clean out.    - (11/15-12/27) -Neurosurgery following, appreciate recommendations  - s/p cervical hemilaminectomy with washout 11/16  - lumbar laminectomy 11/25  - monitor drain- removal 11/28 - consult cardiology for TEE when able to tolerate procedure - PT/OT - transition to PO dilaudid today and try to wean IV pain meds in preparation for discharge  Mild hypokalemia- resolved. - replete PRN - BMP q72h  Thrombocytopenia- resolved.   AKI- resolved.  - avoid nephrotoxic agents  Urinary retention- good UOP overnight - continue increased dose of flomax  - strict I/O - bladder scans q8hr  DVT prophylaxis: Place and maintain sequential compression device Start: 12/09/20 1404 SCD's Start: 12/01/20 1907   Diet:  Diet Orders (From admission, onward)     Start     Ordered   12/11/20 1100  Diet regular Room service appropriate? Yes; Fluid consistency: Thin  Diet effective now       Question Answer Comment  Room service appropriate? Yes   Fluid consistency: Thin      12/11/20 1059            Subjective 12/12/20    Pt reports good UOP overnight. Pain is improved. He wants to attempt to move more today.   Disposition Plan & Communication  Patient status: Inpatient  Admitted From: Home Disposition: Home Anticipated discharge date: TBD  Family Communication: wife at bedside  Consults, Procedures, Significant Events  Consultants:  Neurosurgery ID Neurology Cardiology  General surgery  Procedures/significant events:  hemilaminectomy with washout 11/16 Lumbar clean out 11/25  Antimicrobials:  Anti-infectives (From admission, onward)    Start     Dose/Rate Route Frequency Ordered Stop   12/10/20 1554  vancomycin (VANCOCIN) powder  Status:  Discontinued          As needed 12/10/20 1554 12/10/20 1616   12/10/20 1359  vancomycin (VANCOCIN) 1-5 GM/200ML-% IVPB        Note to Pharmacy: Norton Blizzard  : cabinet override      12/10/20 1359 12/10/20 1817   12/07/20 1800  vancomycin (VANCOREADY) IVPB 1000 mg/200 mL        1,000 mg 200 mL/hr over 60 Minutes Intravenous Every 8 hours 12/07/20 1630     12/03/20 1000  vancomycin (VANCOREADY) IVPB 1000 mg/200 mL  Status:  Discontinued        1,000 mg 200 mL/hr over 60 Minutes Intravenous Every 12 hours 12/03/20 0850 12/03/20 0850   12/03/20 1000  vancomycin (VANCOREADY) IVPB 1500 mg/300 mL  Status:  Discontinued        1,500 mg 150 mL/hr over 120 Minutes Intravenous Every 12 hours 12/03/20 0850 12/07/20 1630   12/01/20 1630  ceftaroline (TEFLARO) 600 mg in sodium chloride 0.9 % 100 mL IVPB        600 mg 100 mL/hr over 60 Minutes Intravenous Every 8 hours 12/01/20 1533     12/01/20 0800  cefTRIAXone (ROCEPHIN) 2 g in sodium chloride 0.9 % 100 mL IVPB  Status:  Discontinued        2 g 200 mL/hr over 30 Minutes Intravenous Every 24 hours 11/30/20 2311 12/01/20 0032   12/01/20 0700  vancomycin (VANCOREADY) IVPB 1000 mg/200 mL  Status:  Discontinued        1,000 mg 200 mL/hr over 60 Minutes Intravenous Every 12 hours 11/30/20 2319 12/03/20 0850   12/01/20 0700  ceFEPIme (MAXIPIME) 2 g in sodium chloride 0.9 % 100 mL IVPB  Status:  Discontinued        2 g 200 mL/hr over 30 Minutes Intravenous Every 8 hours 12/01/20 0054 12/01/20 0128   12/01/20 0618  metroNIDAZOLE (FLAGYL) IVPB 500 mg  Status:  Discontinued        500 mg 100 mL/hr over 60 Minutes Intravenous Every 6 hours 12/01/20 0031 12/01/20 1237   12/01/20 0600  cefTRIAXone (ROCEPHIN) 2 g in sodium chloride 0.9 % 100 mL IVPB  Status:  Discontinued        2 g 200 mL/hr over 30 Minutes Intravenous Every 12 hours 12/01/20 0035 12/01/20 0049   12/01/20 0045  cefTRIAXone (ROCEPHIN) 2 g in sodium chloride 0.9 % 100 mL IVPB  Status:  Discontinued        2 g 200 mL/hr over 30 Minutes Intravenous Every 12 hours 12/01/20 0032 12/01/20 0035   12/01/20 0000   metroNIDAZOLE (FLAGYL) IVPB 500 mg  Status:  Discontinued        500 mg 100 mL/hr over 60 Minutes Intravenous Every 12 hours 11/30/20 2311 12/01/20 0031   11/30/20 2300  ceFEPIme (MAXIPIME) 2 g in sodium chloride 0.9 % 100 mL IVPB  Status:  Discontinued        2 g 200 mL/hr over 30 Minutes Intravenous Every 8 hours 11/30/20 1816 12/01/20 0000   11/30/20 1930  acyclovir (ZOVIRAX) 725 mg in dextrose 5 % 150 mL IVPB  Status:  Discontinued        10 mg/kg  72.6 kg 164.5 mL/hr over 60 Minutes Intravenous Every 8 hours 11/30/20 1915 12/01/20 0032   11/30/20 1500  vancomycin (VANCOREADY) IVPB 1750 mg/350 mL        1,750 mg 175 mL/hr over 120 Minutes Intravenous  Once 11/30/20 1443 11/30/20 2200   11/30/20 1445  ceFEPIme (MAXIPIME) 2 g in sodium chloride 0.9 % 100 mL IVPB        2 g 200 mL/hr over 30 Minutes Intravenous  Once 11/30/20 1436 11/30/20 1728   11/30/20 1445  metroNIDAZOLE (FLAGYL) IVPB 500 mg        500 mg 100 mL/hr over 60 Minutes Intravenous  Once 11/30/20 1436 11/30/20 1835   11/30/20 1445  vancomycin (VANCOCIN) IVPB 1000 mg/200 mL premix  Status:  Discontinued        1,000 mg 200 mL/hr over 60 Minutes Intravenous  Once 11/30/20 1436 11/30/20 1443       Objective   Vitals:   12/11/20 0825 12/11/20 1559 12/11/20 2033 12/12/20 0233  BP: 136/85 134/81 139/79 (!) 159/98  Pulse: 64 73 67 69  Resp: 16 20 17 17   Temp: 97.7 F (36.5 C) 97.7 F (36.5 C) (!) 97.5 F (36.4 C) 98.6 F (37 C)  TempSrc: Oral     SpO2: 97% 95% 97% 94%  Weight:      Height:        Intake/Output Summary (Last 24 hours) at 12/12/2020 0644 Last data filed at 12/12/2020 0600 Gross per 24 hour  Intake 200 ml  Output 690 ml  Net -490 ml    Filed Weights   11/30/20 1311 11/30/20 2216  Weight: 72.6 kg 73.4 kg    Patient BMI: Body mass index is 23.9 kg/m.   Physical Exam: General: awake, alert, NAD HEENT: atraumatic, clear conjunctiva, anicteric sclera, dry mucus membranes, hearing grossly  normal Cardiovascular: quick capillary refill  Nervous: A&O x3. no gross focal neurologic deficits, normal speech Extremities: moves all equally, no edema, normal tone Skin: dry, intact, normal temperature, normal color, excoriations on forearms improved from admission. Psychiatry: flat mood and affect Drain has only a few cc of serosanguinous fluid output since yesterday.   Labs   CBC    Component Value Date/Time   WBC 12.5 (H) 12/12/2020 0418   RBC 2.66 (L) 12/12/2020 0418   HGB 7.7 (L) 12/12/2020 0418   HCT 23.6 (L) 12/12/2020 0418   PLT 748 (H) 12/12/2020 0418   MCV 88.7 12/12/2020 0418   MCH 28.9 12/12/2020 0418   MCHC 32.6 12/12/2020 0418   RDW 15.1 12/12/2020 0418   LYMPHSABS 2.0 12/06/2020 0436   MONOABS 1.3 (H) 12/06/2020 0436   EOSABS 0.0 12/06/2020 0436   BASOSABS 0.0 12/06/2020 0436   BMP Latest Ref Rng & Units 12/11/2020 12/09/2020 12/08/2020  Glucose 70 - 99 mg/dL 125(H) 90 101(H)  BUN 6 - 20 mg/dL 18 20 17   Creatinine 0.61 - 1.24 mg/dL 0.58(L) 0.59(L) 0.62  Sodium 135 - 145 mmol/L 135 132(L) 132(L)  Potassium 3.5 - 5.1 mmol/L 3.8 3.3(L) 3.5  Chloride 98 - 111 mmol/L 103 103 102  CO2 22 - 32 mmol/L 26 25 25   Calcium 8.9 - 10.3 mg/dL 7.6(L) 7.3(L) 7.3(L)   Imaging Studies  No results found. Medications   Scheduled Meds:  amLODipine  10 mg Oral Daily   Chlorhexidine Gluconate Cloth  6 each Topical Q0600   feeding supplement  237 mL Oral TID BM   losartan  50 mg Oral Daily   multivitamin with minerals  1 tablet Oral Daily   oxyCODONE  5 mg Oral Q6H   polyethylene glycol  17 g Oral Daily   tamsulosin  0.8 mg Oral QPC breakfast   No recently discontinued medications to reconcile  LOS: 12 days   Time spent: >38min  Addis Tuohy L Lariya Kinzie, DO [facility] 12/12/2020, 6:44 AM   Please refer to amion to contact the [facility] Attending or Consulting provider for this pt  [email or web address] Available by Epic secure chat 7AM-7PM. If 7PM-7AM, please contact  night-coverage

## 2020-12-12 NOTE — Evaluation (Signed)
Occupational Therapy Re-evaluation Patient Details Name: Lael Pilch MRN: [id number] DOB: [birth year] Today's Date: 12/12/2020   History of Present Illness 45 y.o male presenting with AMS and sepsis. Blood cultures showed MRSA bacteremia. 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 Pt underwent C2-4 right hemilaminectomies for evacuation of epidural on 12/01/20. He was taken for a lumbar decompression on 12/10/2020 for continued infection despite antibiotics. No past medical history other than tobacco use   Clinical Impression   Pt seen for OT re-evaluation this date following lumbar decompression on 11/25. Upon arrival to room, pt asleep in bed however easily awoken and agreeable to OT evaluation. Pt's significant other present for remaining 75% of session. At baseline, pt is an Clinical biochemist, drives, and is fully independent with ADLs. Pt currently presents with RUE swelling, significant back pain during all mobility, decreased short term memory, and decreased activity tolerance. Due to these functional impairments, pt requires MOD A for bed mobility, SUPERVISION/SET-UP assist for seated grooming tasks, MIN A for sit<>stand transfers, and MIN GUARD for walking bed<>sink with RW. OT educated pt in log roll technique and adaptive strategies for oral care as non-pharmalogical approaches to pain management during functional transfers/standing ADLs. Pt verbalized understanding, however required multimodal cues to remember to use strategies during session. Pt's significant other reported that she is a CNA and is planning on providing 24/7 care once pt discharges home. OT provided education on caregiver body positioning for safely assisting pt with functional transfers, with pt's significant other demonstrating good understanding of education provided. Pt would benefit from additional skilled OT services to maximize return to PLOF and minimize risk of future falls, injury, caregiver burden, and  readmission. Upon discharge, recommend [facility] services.       Recommendations for follow up therapy are one component of a multi-disciplinary discharge planning process, led by the attending physician.  Recommendations may be updated based on patient status, additional functional criteria and insurance authorization.   Follow Up Recommendations  Home health OT    Assistance Recommended at Discharge Intermittent Supervision/Assistance  Functional Status Assessment  Patient has had a recent decline in their functional status and demonstrates the ability to make significant improvements in function in a reasonable and predictable amount of time.  Equipment Recommendations  BSC/3in1;Other (comment);Tub/shower seat (2ww)       Precautions / Restrictions Precautions Precautions: Fall Restrictions Weight Bearing Restrictions: No Other Position/Activity Restrictions: s/p C2-4 right hemilaminectomies & L2/3 laminectomies - per neuro notes no activity restrictions, no brace needed      Mobility Bed Mobility Overal bed mobility: Needs Assistance Bed Mobility: Supine to Sit;Sit to Supine     Supine to sit: Mod assist;HOB elevated Sit to supine: Mod assist;HOB elevated   General bed mobility comments: MOD assist to raise trunk to seated position. Increased pain with mobility. required assist to bring LEs on and off bed. Reports dizziness upon initially sitting EOB    Transfers Overall transfer level: Needs assistance Equipment used: Rolling walker (2 wheels) Transfers: Sit to/from Stand Sit to Stand: Min assist;From elevated surface           General transfer comment: Requires MIN A for upward mometum      Balance Overall balance assessment: Needs assistance Sitting-balance support: Feet supported;No upper extremity supported Sitting balance-Leahy Scale: Good Sitting balance - Comments: SUPERVISION sitting EOB performing oral care   Standing balance support: No upper extremity  supported;During functional activity Standing balance-Leahy Scale: Fair Standing balance comment: MIN GUARD for standing at sink  with b/l UE unsupported                           ADL either performed or assessed with clinical judgement   ADL Overall ADL's : Needs assistance/impaired     Grooming: Wash/dry face;Oral care;Supervision/safety;Set up;Sitting Grooming Details (indicate cue type and reason): With washcloth wrap around toothbrush to provide wider grip in setting of R hand swelling, pt able to perform oral care                             Functional mobility during ADLs: Min guard;Rolling walker (2 wheels)       Vision Ability to See in Adequate Light: 0 Adequate Patient Visual Report: No change from baseline              Pertinent Vitals/Pain Pain Assessment: 0-10 Pain Score: 7  Pain Location: mid back where drain is and cervical spine Pain Descriptors / Indicators: Burning;Discomfort;Grimacing;Guarding Pain Intervention(s): Limited activity within patient's tolerance;Monitored during session;Premedicated before session;Repositioned     Hand Dominance Right   Extremity/Trunk Assessment Upper Extremity Assessment Upper Extremity Assessment: RUE deficits/detail;LUE deficits/detail RUE Deficits / Details: Swelling noted in hand, with pt reporting pain with digit flexion and decreased sensation on dorsal surface of hand and ulnar aspect of palm RUE: Unable to fully assess due to pain LUE Deficits / Details: [facility]   Lower Extremity Assessment Lower Extremity Assessment: Generalized weakness   Cervical / Trunk Assessment Cervical / Trunk Assessment: Back Surgery;Neck Surgery   Communication Communication Communication: No difficulties   Cognition Arousal/Alertness: Awake/alert Behavior During Therapy: WFL for tasks assessed/performed Overall Cognitive Status: Within Functional Limits for tasks assessed                                  General Comments: Patient drowsy, confused, and slow to respond. Difficulty recalling education recently provided to him        Exercises Other Exercises Other Exercises: OT engaged pt in gentle ROM of R hand/wrist while seated EOB to reduce pain, x10 reps of composite flexion and wrist flex/ext Other Exercises: OT educated pt's significant other on proper body positioning for assisting pt with functional transfers. Pt's significant other demonstrated good understanding of education provided Other Exercises: OT educated pt in log roll technique and adaptive strategies for oral care (e.g., using two cups- one for rinsing and one of spiting) as a non-pharmalogical approach to pain management during functional transfers/standing ADLs. Pt verbalized understanding, however required multimodal cues to remember to use during session        Home Living Family/patient expects to be discharged to:: Private residence Living Arrangements: Spouse/significant other Available Help at Discharge: Family;Available 24 hours/day (pt's significant other is CNA) Type of Home: House Home Access: Stairs to enter [employer] of Steps: 3 Entrance Stairs-Rails: Can reach both;Left;Right Home Layout: One level     Bathroom Shower/Tub: Teacher, early years/pre: Standard     Home Equipment: None          Prior Functioning/Environment Prior Level of Function : Independent/Modified Independent             Mobility Comments: Independent with functional mobility ADLs Comments: At baseline, pt is an Clinical biochemist, 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        OT Problem List: Decreased  activity tolerance;Impaired balance (sitting and/or standing);Decreased knowledge of precautions;Decreased safety awareness;Decreased coordination      OT Treatment/Interventions: Self-care/ADL training;Therapeutic exercise;Energy conservation;Therapeutic activities    OT Goals(Current  goals can be found in the care plan section) Acute Rehab OT Goals Patient Stated Goal: to go home OT Goal Formulation: With patient/family Time For Goal Achievement: 12/26/20 Potential to Achieve Goals: Good ADL Goals Pt Will Transfer to Toilet: with modified independence;ambulating;bedside commode  OT Frequency: Min 2X/week    AM-PAC OT "6 Clicks" Daily Activity     Outcome Measure Help from another person eating meals?: A Little Help from another person taking care of personal grooming?: A Little Help from another person toileting, which includes using toliet, bedpan, or urinal?: A Little Help from another person bathing (including washing, rinsing, drying)?: A Lot Help from another person to put on and taking off regular upper body clothing?: A Little Help from another person to put on and taking off regular lower body clothing?: A Lot 6 Click Score: 16   End of Session Equipment Utilized During Treatment: Rolling walker (2 wheels) Nurse Communication: Mobility status  Activity Tolerance: Patient limited by pain Patient left: in bed;with call bell/phone within reach;with family/visitor present  OT Visit Diagnosis: Unsteadiness on feet (R26.81);Other abnormalities of gait and mobility (R26.89)                Time: 6010-9323 OT Time Calculation (min): 35 min Charges:  OT General Charges $OT Visit: 1 Visit OT Evaluation $OT Eval Moderate Complexity: 1 Mod OT Treatments $Self Care/Home Management : 23-37 mins  Fredirick Maudlin, OTR/L [place]

## 2020-12-13 LAB — CBC
HCT: 23.4 % — ABNORMAL LOW (ref 39.0–52.0)
Hemoglobin: 7.8 g/dL — ABNORMAL LOW (ref 13.0–17.0)
MCH: 29.8 pg (ref 26.0–34.0)
MCHC: 33.3 g/dL (ref 30.0–36.0)
MCV: 89.3 fL (ref 80.0–100.0)
Platelets: 733 10*3/uL — ABNORMAL HIGH (ref 150–400)
RBC: 2.62 MIL/uL — ABNORMAL LOW (ref 4.22–5.81)
RDW: 14.8 % (ref 11.5–15.5)
WBC: 10 10*3/uL (ref 4.0–10.5)
nRBC: 0 % (ref 0.0–0.2)

## 2020-12-13 LAB — URINALYSIS, COMPLETE (UACMP) WITH MICROSCOPIC
Bacteria, UA: NONE SEEN
Bilirubin Urine: NEGATIVE
Glucose, UA: NEGATIVE mg/dL
Hgb urine dipstick: NEGATIVE
Ketones, ur: NEGATIVE mg/dL
Leukocytes,Ua: NEGATIVE
Nitrite: NEGATIVE
Protein, ur: NEGATIVE mg/dL
Specific Gravity, Urine: 1.02 (ref 1.005–1.030)
Squamous Epithelial / HPF: NONE SEEN (ref 0–5)
pH: 5.5 (ref 5.0–8.0)

## 2020-12-13 LAB — CREATININE, SERUM
Creatinine, Ser: 0.55 mg/dL — ABNORMAL LOW (ref 0.61–1.24)
GFR, Estimated: >60 mL/min (ref >60)

## 2020-12-13 LAB — AEROBIC/ANAEROBIC CULTURE W GRAM STAIN (SURGICAL/DEEP WOUND)

## 2020-12-13 MED ORDER — ENOXAPARIN SODIUM 40 MG/0.4ML IJ SOSY
40.0000 mg | PREFILLED_SYRINGE | INTRAMUSCULAR | Status: DC
  Administered 2020-12-13 – 2020-12-15 (×3): 40 mg via SUBCUTANEOUS
  Filled 2020-12-13 (×3): qty 0.4

## 2020-12-13 NOTE — Progress Notes (Signed)
Occupational Therapy Treatment Patient Details Name: Caleb Fisher MRN: [id number] DOB: 02/25/1975 Today's Date: 12/13/2020   History of present illness Pt. is a 45 y.o male presenting with AMS and sepsis. Blood cultures showed MRSA bacteremia. 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 Pt underwent C2-4 right hemilaminectomies for evacuation of epidural on 12/01/20. He was taken for a lumbar decompression on 12/10/2020 for continued infection despite antibiotics. No past medical history other than tobacco use   OT comments  Pt. Reports 7/10 back pain at rest upon arrival. Pt. Reports increased pain from getting up, and moving around earlier this morning. Pt. Education was provided about A/E use for LE ADLs. Pt.'s right hand pain, and swelling has improved. Pt. Education was provided about right hand tendon glide exercises for fisting, thumb opposition, and digit abduction, and adduction exercises. Light touch sensation to the dorsal of the right hand has improved. Pt. Continues to benefit from OT services for ADL training, A/E training, there. Ex., and pt. Education about home modification, and DME. Pt. Plans to return home upon discharge with family to assist pt. as needed. Pt. Continues to be appropriate for follow-up HHOT services upon discharge.    Recommendations for follow up therapy are one component of a multi-disciplinary discharge planning process, led by the attending physician.  Recommendations may be updated based on patient status, additional functional criteria and insurance authorization.    Follow Up Recommendations  Home health OT    Assistance Recommended at Discharge Intermittent Supervision/Assistance  Equipment Recommendations    3-in-1   Recommendations for Other Services      Precautions / Restrictions         Mobility Bed Mobility               General bed mobility comments: Pt. seen at bed level    Transfers                          Balance                                           ADL either performed or assessed with clinical judgement   ADL                                         General ADL Comments: Pt. education was provided about A/E use for LE ADLS.    Extremity/Trunk Assessment Upper Extremity Assessment Upper Extremity Assessment: Generalized weakness RUE Deficits / Details: Right hand seelling, and pain has improved.            Vision Baseline Vision/History: 0 No visual deficits     Perception     Praxis      Cognition Arousal/Alertness: Awake/alert Behavior During Therapy: WFL for tasks assessed/performed Overall Cognitive Status: Within Functional Limits for tasks assessed                           Safety/Judgement: Decreased awareness of safety;Decreased awareness of deficits                Exercises Other Exercises Other Exercises: Pt. education for right hand tendon glides for fisting, thumb opposition, and digit abduction/adduction   Shoulder Instructions  General Comments      Pertinent Vitals/ Pain       Pain Assessment: 0-10 Pain Score: 7  Pain Descriptors / Indicators: Aching;Sore Pain Intervention(s): Limited activity within patient's tolerance;Monitored during session;Premedicated before session;Repositioned  Home Living                                          Prior Functioning/Environment              Frequency  Min 2X/week        Progress Toward Goals  OT Goals(current goals can now be found in the care plan section)  Progress towards OT goals: Progressing toward goals  Acute Rehab OT Goals Patient Stated Goal: To go home OT Goal Formulation: With patient/family Time For Goal Achievement: 12/26/20 Potential to Achieve Goals: Good  Plan Discharge plan remains appropriate    Co-evaluation                 AM-PAC OT "6 Clicks" Daily Activity     Outcome  Measure   Help from another person eating meals?: None Help from another person taking care of personal grooming?: A Little Help from another person toileting, which includes using toliet, bedpan, or urinal?: A Little Help from another person bathing (including washing, rinsing, drying)?: A Lot Help from another person to put on and taking off regular upper body clothing?: A Little Help from another person to put on and taking off regular lower body clothing?: A Lot 6 Click Score: 17    End of Session    OT Visit Diagnosis: Unsteadiness on feet (R26.81);Other abnormalities of gait and mobility (R26.89)   Activity Tolerance Patient limited by pain   Patient Left in bed;with call bell/phone within reach   Nurse Communication          Time: 7989-2119 OT Time Calculation (min): 20 min  Charges: OT General Charges $OT Visit: 1 Visit OT Treatments $Self Care/Home Management : 8-22 mins  Harrel Carina, MS, OTR/L   Harrel Carina 12/13/2020, 10:45 AM

## 2020-12-13 NOTE — Progress Notes (Signed)
Physical Therapy Treatment Patient Details Name: Caleb Fisher MRN: [id number] DOB: [birth year] Today's Date: 12/13/2020   History of Present Illness Caleb Fisher is a 56yoM presenting with AMS and sepsis. Blood cultures showed MRSA bacteremia. 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 Pt underwent C2-4 right hemilaminectomies for evacuation of epidural on 12/01/20. He was taken for a lumbar decompression on 12/10/2020 for continued infection despite antibiotics. PMH: tobacco use.    PT Comments    Pt in bed upon arrival, motivated to get OOB, although still very pain limited despite have had meds this morning. Pt will absolutely need physical assist from flat bed at this time, but can make due slowly with bed rails and elevated HOB. Pt unable to rise to standing without RW and minA, but can be supervision only when EOB is elevated. Pt AMB slowly at limited household distances today, but has inadequate pain control to do much else. Pt up to recliner at end of session.   Recommendations for follow up therapy are one component of a multi-disciplinary discharge planning process, led by the attending physician.  Recommendations may be updated based on patient status, additional functional criteria and insurance authorization.  Follow Up Recommendations  Home health PT     Assistance Recommended at Discharge Frequent or constant Supervision/Assistance  Equipment Recommendations  Rolling walker (2 wheels)    Recommendations for Other Services       Precautions / Restrictions Precautions Precautions: Fall Restrictions Other Position/Activity Restrictions: s/p C2-4 right hemilaminectomies & L2/3 laminectomies - per neuro notes no activity restrictions, no brace needed     Mobility  Bed Mobility Overal bed mobility: Modified Independent       Supine to sit: HOB elevated     General bed mobility comments: very pain limited adn slow, absolute need for bed rails     Transfers Overall transfer level: Needs assistance Equipment used: Rolling walker (2 wheels)   Sit to Stand: Min assist           General transfer comment: can perform supervision level from eelvated height    Ambulation/Gait Ambulation/Gait assistance: Supervision Gait Distance (Feet): 30 Feet Assistive device: Rolling walker (2 wheels)   Gait velocity: 0.55m/s     General Gait Details: takes alternate fwd/backward steps at bedside with RW several times, then AMB around bed to recliner; extremenmly pain limited, no LOB.   Stairs             Wheelchair Mobility    Modified Rankin (Stroke Patients Only)       Balance                                            Cognition Arousal/Alertness: Awake/alert Behavior During Therapy: WFL for tasks assessed/performed Overall Cognitive Status: Within Functional Limits for tasks assessed Area of Impairment: Safety/judgement                         Safety/Judgement: Decreased awareness of safety;Decreased awareness of deficits              Exercises Other Exercises Other Exercises: Pt. education for right hand tendon glides for fisting, thumb opposition, and digit abduction/adduction    General Comments        Pertinent Vitals/Pain Pain Assessment: 0-10 Pain Score: 7  (low back pain still, has had pain meds prior to  entry) Pain Descriptors / Indicators: Aching;Sore Pain Intervention(s): Limited activity within patient's tolerance;Monitored during session;Premedicated before session;Repositioned    Home Living                          Prior Function            PT Goals (current goals can now be found in the care plan section) Acute Rehab PT Goals Patient Stated Goal: to get back to normal PT Goal Formulation: With patient Time For Goal Achievement: 12/31/20 Potential to Achieve Goals: Fair Progress towards PT goals: Progressing toward goals     Frequency    7X/week      PT Plan Current plan remains appropriate;Frequency needs to be updated    Co-evaluation              AM-PAC PT "6 Clicks" Mobility   Outcome Measure  Help needed turning from your back to your side while in a flat bed without using bedrails?: A Little Help needed moving from lying on your back to sitting on the side of a flat bed without using bedrails?: A Little Help needed moving to and from a bed to a chair (including a wheelchair)?: A Little Help needed standing up from a chair using your arms (e.g., wheelchair or bedside chair)?: A Little Help needed to walk in hospital room?: A Little Help needed climbing 3-5 steps with a railing? : A Little 6 Click Score: 18    End of Session   Activity Tolerance: Patient limited by pain Patient left: with call bell/phone within reach;in chair;with chair alarm set Nurse Communication: Mobility status PT Visit Diagnosis: Other abnormalities of gait and mobility (R26.89);Pain Pain - part of body:  (central low back pain)     Time: 1610-9604 PT Time Calculation (min) (ACUTE ONLY): 23 min  Charges:  $Gait Training: 8-22 mins $Therapeutic Activity: 8-22 mins                    11:12 AM, 12/13/20 Etta Grandchild, PT, DPT Physical Therapist - [facility]  [phone] ([place])    [place] [facility] 12/13/2020, 11:09 AM

## 2020-12-13 NOTE — Progress Notes (Signed)
Pharmacy Antibiotic Note  Caleb Fisher is a 45 y.o. 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  Pharmacy has been consulted for Vancomycin dosing.  Added antistaphylococcal beta lactam, ceftaroline, d/t concern for high bioburden. S/p C2-4 right hemilaminectomies for evacuation of epidural on 12/01/20. ECHO negative for vegetations. Repeat MRI 11/23 with worsening infection with epidural abscess seen in cervical, thoracic and lumbar spine s/p neurosurgery 11/25.  Also noted to have finger abscess w/ MRSA  Patient remains on vancomycin + ceftraroline Vancomycin levels checked 11/24 but not drawn as appropriately as would like 11/24 Vanc Peak = 39 at 0255 11/24 vanco trough = 16 at 0944 1gm dose given at 0219 (so peak drawn too early) and the next dose was started prior to trough lab draw Scr remains stable, repeat Blood cx from 11/19 and 11/26 are NGTD   Plan: Continue vancomycin 1gm IV q8h but recheck vancomycin peak and trough with 2am dose 11/29  Also on ceftaroline 600 mg every 8 hours as above.  Monitor clinical course per ID, renal function, LOT.  Expecting IV antibiotic duration of 6 weeks.  Height: 5\' 9"  (175.3 cm) Weight: 73.4 kg (161 lb 13.1 oz) IBW/kg (Calculated) : 70.7  Temp (24hrs), Avg:98.2 F (36.8 C), Min:98 F (36.7 C), Max:98.3 F (36.8 C)  Recent Labs  Lab 12/07/20 0522 12/08/20 0238 12/09/20 0255 12/09/20 0944 12/11/20 0515 12/12/20 0418 12/13/20 0947  WBC 20.2* 21.0* 20.1*  --  14.5* 12.5* 10.0  CREATININE 0.54* 0.62 0.59*  --  0.58*  --  0.55*  VANCOTROUGH  --   --   --  16  --   --   --   VANCOPEAK  --   --  39  --   --   --   --      Estimated Creatinine Clearance: 115.4 mL/min (A) (by C-G formula based on SCr of 0.55 mg/dL (L)).    No Known Allergies  Antimicrobials this admission: 11/15 Cefepime >> x 2 doses 11/15 Flagyl >>  11/15 Vancomycin >>  Microbiology results: 11/15 BCx: 4 of 4 bottle w/  MRSA 11/16 Epidural absces: MRSA 11/16 wound: MRSA 11/17 Bcx: MRSA 11/19 Bcx: NGTD 11/23 finger: MRSA 11/25 epidural abscess; GPC 11/26 Bcx NGTD   Thank you for allowing pharmacy to be a part of this patient's care.   Doreene Eland, PharmD, BCPS, BCIDP Work Cell: [phone] 12/13/2020 11:55 AM

## 2020-12-13 NOTE — TOC Progression Note (Signed)
Transition of Care [facility]) - Progression Note    Patient Details  Name: Caleb Fisher MRN: [id number] Date of Birth: 12-15-1975  Transition of Care [facility]) CM/SW [place], RN Phone Number: 12/13/2020, 2:57 PM  Clinical Narrative:   Patient states he feels comfortable returning home without [facility] PT OT.  He states he has well trained, supportive loved ones who can assist him at home.  Patient is aware and he accepts [facility] for his PICC line.  Carolynn Sayers is aware, and will set up for discharge when medically ready.  [employer] notified re: rolling walker and 3 n 1 to be delivered to patient's room.    TOC to follow.         Expected Discharge Plan and Services                                                 Social Determinants of Health (SDOH) Interventions    Readmission Risk Interventions No flowsheet data found.

## 2020-12-13 NOTE — Progress Notes (Signed)
Attending Progress Note  History: Caleb Fisher is a 45 y.o male presenting with AMS and sepsis. Blood cultures showed MRSA bacteremia. 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 Pt underwent C2-4 right hemilaminectomies for evacuation of epidural on 12/01/20. He was taken for a lumbar decompression on 12/10/2020 for continued infection despite antibiotics.   12/13/20: continued back pain   24 Hour events 11/26: Patient having expected back pain but denies any leg symptoms. Drain output has been minimal. He is tolerating a diet and voiding   Physical Exam: Vitals:   12/13/20 0403 12/13/20 0754  BP: (!) 147/89 (!) 154/84  Pulse: 80 70  Resp: 17 16  Temp: 98.2 F (36.8 C) 98 F (36.7 C)  SpO2: 97% 96%     CNI 5/5 in upper and lower extremities Sensation intact Incision - c/d/I and open to air with sutures in place  Data:  Recent Labs  Lab 12/08/20 0238 12/09/20 0255 12/11/20 0515  NA 132* 132* 135  K 3.5 3.3* 3.8  CL 102 103 103  CO2 25 25 26   BUN 17 20 18   CREATININE 0.62 0.59* 0.58*  GLUCOSE 101* 90 125*  CALCIUM 7.3* 7.3* 7.6*    Recent Labs  Lab 12/11/20 0515  AST 42*  ALT 44  ALKPHOS 124      Recent Labs  Lab 12/09/20 0255 12/11/20 0515 12/12/20 0418  WBC 20.1* 14.5* 12.5*  HGB 8.4* 8.0* 7.7*  HCT 24.3* 24.0* 23.6*  PLT 514* 762* 748*    No results for input(s): APTT, INR in the last 168 hours.         Assessment/Plan:  Caleb Fisher is a 45 y.o presenting with MRSA bacteremia and a cervical epidural abscess status post C2-4 right hemilaminectomies for evacuation of epidural abscess on 12/01/20 and L2/3 laminectomies for epidural abscess on 12/10/20.  - ABX coverage per ID recommendations - no activity restrictions from a neurosurgical standpoint - q4 hr neuro checks - Continue DVT PPX - Remainder of plan per primary team and ID - HV drain removed today without issues. Sutures will need to be removed around POD10-14. Will have  our office set up outpatient follow up for this.  Cooper Render PA-C Department of Neurosurgery

## 2020-12-13 NOTE — TOC Progression Note (Signed)
Transition of Care [facility]) - Progression Note    Patient Details  Name: Caleb Fisher MRN: [id number] Date of Birth: 1975-03-07  Transition of Care [facility]) CM/SW Martin, RN Phone Number: 12/13/2020, 8:46 AM  Clinical Narrative:   Went in the room and met with the patient and his girlfriend, I explained the role of [place], PT and OT, I explained that they do not have to agree to the PT or OT but they would have to agree to the Nursing in order to go home with a PICC line, I explained that the company [employer] has agreed to accept for the PT and OT portion and that [employer] would do the Nursing, They said they would discuss today and let the CM know if they want the PT and OT portion, They agreed to the Nursing for [facility].         Expected Discharge Plan and Services                                                 Social Determinants of Health (SDOH) Interventions    Readmission Risk Interventions No flowsheet data found.

## 2020-12-13 NOTE — Progress Notes (Signed)
PROGRESS NOTE  Laren Whaling    DOB: 1975/02/11, 45 y.o.  FYB:[id number]  PCP: Pcp, No   Code Status: Full Code   DOA: 11/30/2020   LOS: 19  Brief Narrative of Current Hospitalization  Devonta Blanford is a 45 y.o. male with a PMH significant for significant tobacco use. They presented from home to the ED on 11/30/2020 with several days of altered mental status and increased back/neck pain. In the ED, it was found that they had sepsis from unknown source. They were treated with IV fluids and antibiotics.  Neurology was consulted for LP.  Subsequently, patient was found to have MRSA bacteremia.  Imaging revealed cervical epidural abscesses involving C2-C5. He had R 5th digit abscess drained.  Repeat spinal imaging showed more progressive infection including worsening of cervical and involvement of thoracic/lumbar vertebrae.  11/16, and 11/25 neurosurgical procedures for laminectomies   12/13/20 -improved  Assessment & Plan  Active Problems:   Encephalopathy acute   Altered mental status   MRSA bacteremia   Abscess in epidural space of cervical spine   Hypertensive urgency   Hypokalemia   Thrombocytopenia (HCC)   Acute metabolic encephalopathy   Severe sepsis (HCC)   Acute urinary retention   Abnormal LFTs   Acute hypercapnic respiratory failure ([place])   Fall   Acute respiratory failure with hypoxia (HCC)   Epidural abscess   Transaminitis  Severe sepsis MRSA bacteremia Cervical, Lumbar epidural spinal abscess Septic pulmonary emboli POA as evidenced by tachycardia, tachypnea, fevers, acute encephalopathy consistent with organ dysfunction and severe sepsis. Sepsis physiology resolved. Wound cultures positive for MRSA sensitive to vancomycin. Presumed source is excoriations on forearms. Repeat blood cultures collected 11/19 NGTD.  WBC improved to 14.5>12.5. afebrile. Repeat blood cultures 11/26 NGTD -ID following, appreciate recommendations  - Repeat BcCx 11/26 and if negative can  order PICC  - currently on vancomycin and ceftaroline. Will need 6 weeks total Abx from total spine clean out.    - (11/15-12/27) -Neurosurgery following, appreciate recommendations  - s/p cervical hemilaminectomy with washout 11/16  - lumbar laminectomy 11/25  - monitor drain- removal 11/28 - consult cardiology for TEE when able to tolerate procedure - PT/OT- recommending HHPT - continue PO dilaudid  Mild hypokalemia- resolved. - replete PRN - BMP q72h  Thrombocytopenia- resolved.   AKI- resolved.  - avoid nephrotoxic agents  Urinary retention- good UOP overnight - continue increased dose of flomax  - strict I/O  DVT prophylaxis: Place and maintain sequential compression device Start: 12/09/20 1404 SCD's Start: 12/01/20 1907   Diet:  Diet Orders (From admission, onward)     Start     Ordered   12/11/20 1100  Diet regular Room service appropriate? Yes; Fluid consistency: Thin  Diet effective now       Question Answer Comment  Room service appropriate? Yes   Fluid consistency: Thin      12/11/20 1059            Subjective 12/13/20    Pt reports feeling well today. He's happy to be sitting up and using bedside commode.   Disposition Plan & Communication  Patient status: Inpatient  Admitted From: Home Disposition: Home with HHPT Anticipated discharge date: TBD  Family Communication: wife at bedside  Consults, Procedures, Significant Events  Consultants:  Neurosurgery ID Neurology Cardiology  General surgery  Procedures/significant events:  hemilaminectomy with washout 11/16 Lumbar clean out 11/25  Antimicrobials:  Anti-infectives (From admission, onward)    Start     Dose/Rate  Route Frequency Ordered Stop   12/10/20 1554  vancomycin (VANCOCIN) powder  Status:  Discontinued          As needed 12/10/20 1554 12/10/20 1616   12/10/20 1359  vancomycin (VANCOCIN) 1-5 GM/200ML-% IVPB       Note to Pharmacy: Norton Blizzard  : cabinet override       12/10/20 1359 12/10/20 1817   12/07/20 1800  vancomycin (VANCOREADY) IVPB 1000 mg/200 mL        1,000 mg 200 mL/hr over 60 Minutes Intravenous Every 8 hours 12/07/20 1630     12/03/20 1000  vancomycin (VANCOREADY) IVPB 1000 mg/200 mL  Status:  Discontinued        1,000 mg 200 mL/hr over 60 Minutes Intravenous Every 12 hours 12/03/20 0850 12/03/20 0850   12/03/20 1000  vancomycin (VANCOREADY) IVPB 1500 mg/300 mL  Status:  Discontinued        1,500 mg 150 mL/hr over 120 Minutes Intravenous Every 12 hours 12/03/20 0850 12/07/20 1630   12/01/20 1630  ceftaroline (TEFLARO) 600 mg in sodium chloride 0.9 % 100 mL IVPB        600 mg 100 mL/hr over 60 Minutes Intravenous Every 8 hours 12/01/20 1533     12/01/20 0800  cefTRIAXone (ROCEPHIN) 2 g in sodium chloride 0.9 % 100 mL IVPB  Status:  Discontinued        2 g 200 mL/hr over 30 Minutes Intravenous Every 24 hours 11/30/20 2311 12/01/20 0032   12/01/20 0700  vancomycin (VANCOREADY) IVPB 1000 mg/200 mL  Status:  Discontinued        1,000 mg 200 mL/hr over 60 Minutes Intravenous Every 12 hours 11/30/20 2319 12/03/20 0850   12/01/20 0700  ceFEPIme (MAXIPIME) 2 g in sodium chloride 0.9 % 100 mL IVPB  Status:  Discontinued        2 g 200 mL/hr over 30 Minutes Intravenous Every 8 hours 12/01/20 0054 12/01/20 0128   12/01/20 0618  metroNIDAZOLE (FLAGYL) IVPB 500 mg  Status:  Discontinued        500 mg 100 mL/hr over 60 Minutes Intravenous Every 6 hours 12/01/20 0031 12/01/20 1237   12/01/20 0600  cefTRIAXone (ROCEPHIN) 2 g in sodium chloride 0.9 % 100 mL IVPB  Status:  Discontinued        2 g 200 mL/hr over 30 Minutes Intravenous Every 12 hours 12/01/20 0035 12/01/20 0049   12/01/20 0045  cefTRIAXone (ROCEPHIN) 2 g in sodium chloride 0.9 % 100 mL IVPB  Status:  Discontinued        2 g 200 mL/hr over 30 Minutes Intravenous Every 12 hours 12/01/20 0032 12/01/20 0035   12/01/20 0000  metroNIDAZOLE (FLAGYL) IVPB 500 mg  Status:  Discontinued        500  mg 100 mL/hr over 60 Minutes Intravenous Every 12 hours 11/30/20 2311 12/01/20 0031   11/30/20 2300  ceFEPIme (MAXIPIME) 2 g in sodium chloride 0.9 % 100 mL IVPB  Status:  Discontinued        2 g 200 mL/hr over 30 Minutes Intravenous Every 8 hours 11/30/20 1816 12/01/20 0000   11/30/20 1930  acyclovir (ZOVIRAX) 725 mg in dextrose 5 % 150 mL IVPB  Status:  Discontinued        10 mg/kg  72.6 kg 164.5 mL/hr over 60 Minutes Intravenous Every 8 hours 11/30/20 1915 12/01/20 0032   11/30/20 1500  vancomycin (VANCOREADY) IVPB 1750 mg/350 mL  1,750 mg 175 mL/hr over 120 Minutes Intravenous  Once 11/30/20 1443 11/30/20 2200   11/30/20 1445  ceFEPIme (MAXIPIME) 2 g in sodium chloride 0.9 % 100 mL IVPB        2 g 200 mL/hr over 30 Minutes Intravenous  Once 11/30/20 1436 11/30/20 1728   11/30/20 1445  metroNIDAZOLE (FLAGYL) IVPB 500 mg        500 mg 100 mL/hr over 60 Minutes Intravenous  Once 11/30/20 1436 11/30/20 1835   11/30/20 1445  vancomycin (VANCOCIN) IVPB 1000 mg/200 mL premix  Status:  Discontinued        1,000 mg 200 mL/hr over 60 Minutes Intravenous  Once 11/30/20 1436 11/30/20 1443       Objective   Vitals:   12/12/20 1617 12/12/20 1617 12/12/20 2124 12/13/20 0403  BP: 125/65 125/65 136/87 (!) 147/89  Pulse: 89 84 67 80  Resp: 20 20 18 17   Temp: 98.3 F (36.8 C) 98.3 F (36.8 C) 98.1 F (36.7 C) 98.2 F (36.8 C)  TempSrc:      SpO2: 99% 98% 95% 97%  Weight:      Height:        Intake/Output Summary (Last 24 hours) at 12/13/2020 0649 Last data filed at 12/13/2020 0644 Gross per 24 hour  Intake --  Output 1510 ml  Net -1510 ml    Filed Weights   11/30/20 1311 11/30/20 2216  Weight: 72.6 kg 73.4 kg    Patient BMI: Body mass index is 23.9 kg/m.   Physical Exam: General: awake, alert, NAD HEENT: atraumatic, clear conjunctiva, anicteric sclera, dry mucus membranes, hearing grossly normal Cardiovascular: quick capillary refill  Nervous: A&O x3. no gross focal  neurologic deficits, normal speech Extremities: moves all equally, no edema, normal tone Skin: dry, intact, normal temperature, normal color, excoriations on forearms improved from admission. Psychiatry: flat mood and affect Drain has only a few cc of serosanguinous fluid output.  Labs   CBC    Component Value Date/Time   WBC 12.5 (H) 12/12/2020 0418   RBC 2.66 (L) 12/12/2020 0418   HGB 7.7 (L) 12/12/2020 0418   HCT 23.6 (L) 12/12/2020 0418   PLT 748 (H) 12/12/2020 0418   MCV 88.7 12/12/2020 0418   MCH 28.9 12/12/2020 0418   MCHC 32.6 12/12/2020 0418   RDW 15.1 12/12/2020 0418   LYMPHSABS 2.0 12/06/2020 0436   MONOABS 1.3 (H) 12/06/2020 0436   EOSABS 0.0 12/06/2020 0436   BASOSABS 0.0 12/06/2020 0436   BMP Latest Ref Rng & Units 12/11/2020 12/09/2020 12/08/2020  Glucose 70 - 99 mg/dL 125(H) 90 101(H)  BUN 6 - 20 mg/dL 18 20 17   Creatinine 0.61 - 1.24 mg/dL 0.58(L) 0.59(L) 0.62  Sodium 135 - 145 mmol/L 135 132(L) 132(L)  Potassium 3.5 - 5.1 mmol/L 3.8 3.3(L) 3.5  Chloride 98 - 111 mmol/L 103 103 102  CO2 22 - 32 mmol/L 26 25 25   Calcium 8.9 - 10.3 mg/dL 7.6(L) 7.3(L) 7.3(L)   Imaging Studies  No results found. Medications   Scheduled Meds:  amLODipine  10 mg Oral Daily   Chlorhexidine Gluconate Cloth  6 each Topical Q0600   feeding supplement  237 mL Oral TID BM   losartan  50 mg Oral Daily   multivitamin with minerals  1 tablet Oral Daily   polyethylene glycol  17 g Oral Daily   tamsulosin  0.8 mg Oral QPC breakfast   No recently discontinued medications to reconcile  LOS: 13 days  Time spent: >59min  Richarda Osmond, DO [facility] 12/13/2020, 6:49 AM   Please refer to amion to contact the [facility] Attending or Consulting provider for this pt  [email or web address] Available by Epic secure chat 7AM-7PM. If 7PM-7AM, please contact night-coverage

## 2020-12-13 NOTE — Progress Notes (Signed)
PT Cancellation Note  Patient Details Name: Caleb Fisher MRN: [id number] DOB: [birth year]   Cancelled Treatment:    Reason Eval/Treat Not Completed: Other (comment). Author returned to room to see if patient had better pain control and/or interest in attempting additional AMB training. Pt politely declines, reports happy to get OOB to chair earlier, but remains very much wiped out from recent procedures. Will resume next date.   2:39 PM, 12/13/20 Etta Grandchild, PT, DPT Physical Therapist - [facility]  [phone] [facility])  s   Etta Grandchild 12/13/2020, 2:38 PM

## 2020-12-13 NOTE — Progress Notes (Signed)
Date of Admission:  11/30/2020   T  ID: Caleb Fisher is a 45 y.o. male Active Problems:   Encephalopathy acute   Altered mental status   MRSA bacteremia   Abscess in epidural space of cervical spine   Hypertensive urgency   Hypokalemia   Thrombocytopenia (HCC)   Acute metabolic encephalopathy   Severe sepsis (HCC)   Acute urinary retention   Abnormal LFTs   Acute hypercapnic respiratory failure ([place])   Fall   Acute respiratory failure with hypoxia (HCC)   Epidural abscess   Transaminitis    Subjective: Pt is doing much better Pain back present but not like before Sitting in chair No fever Appetite good  Medications:   amLODipine  10 mg Oral Daily   Chlorhexidine Gluconate Cloth  6 each Topical Q0600   enoxaparin (LOVENOX) injection  40 mg Subcutaneous Q24H   feeding supplement  237 mL Oral TID BM   losartan  50 mg Oral Daily   multivitamin with minerals  1 tablet Oral Daily   polyethylene glycol  17 g Oral Daily   tamsulosin  0.8 mg Oral QPC breakfast    Objective: Vital signs in last 24 hours: Temp:  [97.7 F (36.5 C)-98.3 F (36.8 C)] 97.7 F (36.5 C) (11/28 1206) Pulse Rate:  [67-89] 88 (11/28 1206) Resp:  [16-20] 18 (11/28 1206) BP: (95-154)/(65-89) 95/67 (11/28 1206) SpO2:  [95 %-99 %] 98 % (11/28 1206)  PHYSICAL EXAM:  General: Alert, cooperative,  Head: Normocephalic, without obvious abnormality, atraumatic. Cervical spine - surgical site clean and healthy Eyes: Conjunctivae clear, anicteric sclerae. Pupils are equal ENT Nares normal. No drainage or sinus tenderness. Lips, mucosa, and tongue normal. No Thrush Neck: symmetrical no carotid bruit and no JVD. Back: lumbar surgical incision site- no erythema or discharge- lumbar drain has been removed- site covered with dressing Lungs: b/l air entry Heart: Regular rate and rhythm, no murmur, rub or gallop. Abdomen: Soft, non-tender,not distended. Bowel sounds normal. No masses Extremities:  atraumatic, no cyanosis. No edema. No clubbing Skin: No rashes or lesions. Or bruising Lymph: Cervical, supraclavicular normal. Neurologic: non focal  Lab Results Recent Labs    12/11/20 0515 12/12/20 0418 12/13/20 0947  WBC 14.5* 12.5* 10.0  HGB 8.0* 7.7* 7.8*  HCT 24.0* 23.6* 23.4*  NA 135  --   --   K 3.8  --   --   CL 103  --   --   CO2 26  --   --   BUN 18  --   --   CREATININE 0.58*  --  0.55*   Liver Panel Recent Labs    12/11/20 0515  PROT 5.0*  ALBUMIN 1.7*  AST 42*  ALT 44  ALKPHOS 124  BILITOT 0.7   Sedimentation Rate No results for input(s): ESRSEDRATE in the last 72 hours. C-Reactive Protein No results for input(s): CRP in the last 72 hours.  Microbiology: 11/15 [facility] - MRSA 11/17 [facility] MRSA 11/19 BC NG 11/16 forearm wound- MRSA 12/01/20 cervical spine MRSA 11/23 Finger abscess- MRSA 11/25-lumbar abscess - Gram positive cocci 12/11/20- BC-NG so far    Assessment/Plan: MRSA bacteremia with disseminated MRSA infection of cervical  lumbar spine with epidural abscess, finger abscess Source excoriated lesions forearms S/p cervical hemilaminectomy Lumbar laminectomy L2-L3 with drainage of epidural abscess on 12/10/20 Continue vancomycin and ceftaroline TEE may not change the duration of antibiotic but if a  vegetation is seen and is large > 1 cm surgical intervention may  be needed  Pt currently on vanco and ceftaroline- Vanco level will be done tomorrow.  Pt is going to need 6 weeks of Iv antibiotic-01/20/21. Will decide on the final antibiotic once vanco levels are obtained  Persistent leucocytosis- has resolved  B/L pulmonary nodules- likely from septic emboli  Aki has resolved  Urinary retention- resolved Discussed the management with patient and care team

## 2020-12-14 DIAGNOSIS — B9689 Other specified bacterial agents as the cause of diseases classified elsewhere: Secondary | ICD-10-CM

## 2020-12-14 LAB — VANCOMYCIN, TROUGH: Vancomycin Tr: 17 ug/mL (ref 15–20)

## 2020-12-14 LAB — CREATININE, SERUM
Creatinine, Ser: 0.51 mg/dL — ABNORMAL LOW (ref 0.61–1.24)
GFR, Estimated: >60 mL/min (ref >60)

## 2020-12-14 LAB — VANCOMYCIN, PEAK: Vancomycin Pk: 28 ug/mL — ABNORMAL LOW (ref 30–40)

## 2020-12-14 MED ORDER — SENNA 8.6 MG PO TABS
1.0000 | ORAL_TABLET | Freq: Every day | ORAL | Status: DC
  Administered 2020-12-14 – 2020-12-17 (×3): 8.6 mg via ORAL
  Filled 2020-12-14 (×3): qty 1

## 2020-12-14 MED ORDER — POLYETHYLENE GLYCOL 3350 17 G PO PACK
17.0000 g | PACK | Freq: Two times a day (BID) | ORAL | Status: DC
  Filled 2020-12-14 (×3): qty 1

## 2020-12-14 MED ORDER — SENNA 8.6 MG PO TABS
ORAL_TABLET | ORAL | Status: AC
  Filled 2020-12-14: qty 1

## 2020-12-14 NOTE — Significant Event (Signed)
Cardiology evaluation note  I was called to evaluate the patient for appropriateness of transesophageal echo.  Briefly he is a 45 year old male with a past medical history of tobacco abuse who was admitted on 11/15 with altered mental status and neck/back pain.  He was subsequently discovered to have staph aureus bacteremia with multiple areas of abscess/infection.  He has now undergone drainage of these abscesses and is improving.  His echocardiogram completed on admission showed no evidence of endocarditis and a normal EF, but TEE is requested to rule out endocarditis definitively.  He has no apparent contraindication, as he has no coagulopathy, normal platelets, and no esophageal pathology.  According to the primary provider, there is no contraindication to neck manipulation during the TEE procedure after his recent cervical laminectomy and abscess drainage.  We will plan to proceed with TEE tomorrow morning.  The risks and benefits were discussed at length with the patient and his wife who are agreeable to proceed.  Andrez Grime, MD

## 2020-12-14 NOTE — Progress Notes (Signed)
Pharmacy Antibiotic Note  Caleb Fisher is a 45 y.o. 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  Pharmacy has been consulted for Vancomycin dosing.  Added antistaphylococcal beta lactam, ceftaroline, d/t concern for high bioburden. S/p C2-4 right hemilaminectomies for evacuation of epidural on 12/01/20. ECHO negative for vegetations. Repeat MRI 11/23 with worsening infection with epidural abscess seen in cervical, thoracic and lumbar spine s/p neurosurgery 11/25.  Also noted to have finger abscess w/ MRSA  Patient remains on vancomycin + ceftraroline Vancomycin levels checked 11/24 but not drawn as appropriately as would like 11/24 Vanc Peak = 39 at 0255 11/24 vanco trough = 16 at 0944 1gm dose given at 0219 (so peak drawn too early) and the next dose was started prior to trough lab draw Scr remains stable, repeat Blood cx from 11/19 and 11/26 are NGTD  Vancomycin levels for 11/29 Vanco 1gm IV q8h, dose given at 01:55 Vanco peak at 04:30= 28  Vanco trough a 09:30= 17 True Cmax (Peak) = 32.8 True Cmin (trough) = 16.3 AUC = 569.6 (goal 400-600)   Plan: Vancomycin levels collected today are demonstrate a AUC within goal (as above) and a trough within range 15-20 as desired by ID.  Continue vancomycin 1gm IV q8h  To avoid q8h dosing at home, options for q12h regimens include: Vancomycin 1500mg  IV q12h for peak = 38 and trough = 13 (AUC = 568.7) Vancomycin 1750mg  IV q12h for peak = 43 and trough = 16 (AUC 663)  Also on ceftaroline 600 mg every 8 hours as above. Follow-up with ID appropriate discontinuation date  Monitor clinical course per ID, renal function, LOT.  Per ID, last day of 01/20/2021  OPAT to follow once plan finalized  Height: 5\' 9"  (175.3 cm) Weight: 73.4 kg (161 lb 13.1 oz) IBW/kg (Calculated) : 70.7  Temp (24hrs), Avg:97.8 F (36.6 C), Min:97.5 F (36.4 C), Max:98 F (36.7 C)  Recent Labs  Lab 12/08/20 0238 12/09/20 0255  12/09/20 0944 12/11/20 0515 12/12/20 0418 12/13/20 0947 12/14/20 0430 12/14/20 0930  WBC 21.0* 20.1*  --  14.5* 12.5* 10.0  --   --   CREATININE 0.62 0.59*  --  0.58*  --  0.55* 0.51*  --   VANCOTROUGH  --   --  16  --   --   --   --  17  VANCOPEAK  --  39  --   --   --   --  28*  --      Estimated Creatinine Clearance: 115.4 mL/min (A) (by C-G formula based on SCr of 0.51 mg/dL (L)).    No Known Allergies  Antimicrobials this admission: 11/15 Cefepime >> x 2 doses 11/15 Flagyl >>  11/15 Vancomycin >>  Microbiology results: 11/15 BCx: 4 of 4 bottle w/ MRSA 11/16 Epidural absces: MRSA 11/16 wound: MRSA 11/17 Bcx: MRSA 11/19 Bcx: NGTD 11/23 finger: MRSA 11/25 epidural abscess; GPC 11/26 Bcx NGTD   Thank you for allowing pharmacy to be a part of this patient's care.   Doreene Eland, PharmD, BCPS, BCIDP Work Cell: [phone] 12/14/2020 10:04 AM

## 2020-12-14 NOTE — Progress Notes (Addendum)
Date of Admission:  11/30/2020   T  ID: Caleb Fisher is a 45 y.o. male Active Problems:   Encephalopathy acute   Altered mental status   MRSA bacteremia   Abscess in epidural space of cervical spine   Hypertensive urgency   Hypokalemia   Thrombocytopenia (HCC)   Acute metabolic encephalopathy   Severe sepsis (HCC)   Acute urinary retention   Abnormal LFTs   Acute hypercapnic respiratory failure ([place])   Fall   Acute respiratory failure with hypoxia (HCC)   Epidural abscess   Transaminitis    Subjective: Pt is doing much better Says his brain is much clearer now Does not remember anything before 11/29/20 Wife at bed side Pain abck better He walked with PT in the corridor He is going for TEE tomorrow  Medications:   amLODipine  10 mg Oral Daily   Chlorhexidine Gluconate Cloth  6 each Topical Q0600   enoxaparin (LOVENOX) injection  40 mg Subcutaneous Q24H   feeding supplement  237 mL Oral TID BM   losartan  50 mg Oral Daily   multivitamin with minerals  1 tablet Oral Daily   polyethylene glycol  17 g Oral BID   senna  1 tablet Oral Daily   tamsulosin  0.8 mg Oral QPC breakfast    Objective: Vital signs in last 24 hours: Temp:  [97.5 F (36.4 C)-98 F (36.7 C)] 98 F (36.7 C) (11/29 1118) Pulse Rate:  [61-81] 73 (11/29 1118) Resp:  [15-20] 15 (11/29 1118) BP: (113-160)/(61-95) 113/61 (11/29 1118) SpO2:  [96 %-100 %] 96 % (11/29 1118)  PHYSICAL EXAM:  General: Alert, cooperative, no distress at rest Back: lumbar surgical incision site- no erythema or discharge- lumbar drain has been removed- site covered with dressing Lungs: b/l air entry Heart: Regular rate and rhythm, no murmur, rub or gallop. Abdomen: Soft, non-tender,not distended. Bowel sounds normal. No masses Extremities: atraumatic, no cyanosis. No edema. No clubbing Skin: No rashes or lesions. Or bruising Lymph: Cervical, supraclavicular normal. Neurologic: non focal  Lab Results Recent Labs     12/12/20 0418 12/13/20 0947 12/14/20 0430  WBC 12.5* 10.0  --   HGB 7.7* 7.8*  --   HCT 23.6* 23.4*  --   CREATININE  --  0.55* 0.51*   Liver Panel No results for input(s): PROT, ALBUMIN, AST, ALT, ALKPHOS, BILITOT, BILIDIR, IBILI in the last 72 hours.  Sedimentation Rate No results for input(s): ESRSEDRATE in the last 72 hours. C-Reactive Protein No results for input(s): CRP in the last 72 hours.  Microbiology: 11/15 [facility] - MRSA 11/17 [facility] MRSA 11/19 BC NG 11/16 forearm wound- MRSA 12/01/20 cervical spine MRSA 11/23 Finger abscess- MRSA 11/25-lumbar abscess - Gram positive cocci 12/11/20- BC-NG so far    Assessment/Plan: MRSA bacteremia with disseminated MRSA infection of cervical  lumbar spine with epidural abscess, finger abscess Source excoriated lesions forearms S/p cervical hemilaminectomy Lumbar laminectomy L2-L3 with drainage of epidural abscess on 12/10/20 Continue vancomycin and ceftaroline TEE may not change the duration of antibiotics but if a  vegetation is seen and is large > 1 cm surgical intervention may be needed - TEE scheduled for tomorrow Pt currently on vanco and ceftaroline- Vanco level  17. So will Dc ceftaroline  MRI lumbar spine : L5-S1 discitis /OM L4 osteo Increased T2 signal and enhancement in L5 and at the superior aspect of S1, with fluid and enhancement in the disc space, concerning for osteomyelitis discitis. Additional abnormal signal in the posterior  aspect of L4 may represent early osteomyelitis Pt is going to need 6 weeks of Iv antibiotic-01/20/21. PT and wife okay to do vanco1 gram IV q 8 hrs  Persistent leucocytosis- has resolved  B/L pulmonary nodules- likely from septic emboli  Aki has resolved  Anemia- Hb 7.8  was 12.8 on admission- 5 gram drop over 2 week period- combination of infection, malnutrition, dilution and phlebotomy pt and wife concerned - primary team to consider further work up/supplement  Urinary retention-  resolved Discussed the management with patient and  wife- Will place OPAT tomorrow

## 2020-12-14 NOTE — Progress Notes (Addendum)
PROGRESS NOTE  Caleb Fisher    DOB: February 08, 1975, 45 y.o.  PJA:[id number]  PCP: Pcp, No   Code Status: Full Code   DOA: 11/30/2020   LOS: 38  Brief Narrative of Current Hospitalization  Caleb Fisher is a 45 y.o. male with a PMH significant for significant tobacco use. They presented from home to the ED on 11/30/2020 with several days of altered mental status and increased back/neck pain. In the ED, it was found that they had sepsis from unknown source. They were treated with IV fluids and antibiotics.  Neurology was consulted for LP.  Subsequently, patient was found to have MRSA bacteremia.  Imaging revealed cervical epidural abscesses involving C2-C5. He had R 5th digit abscess drained.  Repeat spinal imaging showed more progressive infection including worsening of cervical and involvement of thoracic/lumbar vertebrae.  11/16, and 11/25 neurosurgical procedures for laminectomies. 11/28 had drain removed. Increased mobility and improved pain.  12/14/20 -improved  Assessment & Plan  Active Problems:   Encephalopathy acute   Altered mental status   MRSA bacteremia   Abscess in epidural space of cervical spine   Hypertensive urgency   Hypokalemia   Thrombocytopenia (HCC)   Acute metabolic encephalopathy   Severe sepsis (HCC)   Acute urinary retention   Abnormal LFTs   Acute hypercapnic respiratory failure ([place])   Fall   Acute respiratory failure with hypoxia (HCC)   Epidural abscess   Transaminitis  Severe sepsis MRSA bacteremia Cervical, Lumbar epidural spinal abscess Septic pulmonary emboli POA as evidenced by tachycardia, tachypnea, fevers, acute encephalopathy consistent with organ dysfunction and severe sepsis. Sepsis physiology resolved. Wound cultures positive for MRSA sensitive to vancomycin. Presumed source is excoriations on forearms. Repeat blood cultures collected 11/19 NGTD.  WBC improved to 14.5>12.5. afebrile. Repeat blood cultures 11/26 NGTD -ID following,  appreciate recommendations  - Repeat BcCx 11/26 and if negative can order PICC  - currently on vancomycin and ceftaroline. Will need 6 weeks total Abx from total spine clean out.    - (11/15-12/27) -Neurosurgery following, appreciate recommendations  - s/p cervical hemilaminectomy with washout 11/16  - lumbar laminectomy 11/25  - drain- removal 11/28 - cardiology consulted for TEE   - scheduled for TEE 11/30. Patient is NPO at midnight - PT/OT- recommending HHPT - continue PO dilaudid - increase bowel regimen  Mild hypokalemia- resolved. - replete PRN - BMP q72h  Thrombocytopenia- resolved.   AKI- resolved.  - avoid nephrotoxic agents  Urinary retention- good UOP overnight - continue increased dose of flomax  - strict I/O  DVT prophylaxis: enoxaparin (LOVENOX) injection 40 mg Start: 12/13/20 1000   Diet:  Diet Orders (From admission, onward)     Start     Ordered   12/11/20 1100  Diet regular Room service appropriate? Yes; Fluid consistency: Thin  Diet effective now       Question Answer Comment  Room service appropriate? Yes   Fluid consistency: Thin      12/11/20 1059            Subjective 12/14/20    Pt reports improvement in strength and pain today. Having Bms about every 2-3 days.   Disposition Plan & Communication  Patient status: Inpatient  Admitted From: Home Disposition: Home with HHPT Anticipated discharge date: likely 11/30-12/1  Family Communication: wife at bedside  Consults, Procedures, Significant Events  Consultants:  Neurosurgery ID Neurology Cardiology  General surgery  Procedures/significant events:  hemilaminectomy with washout 11/16 Lumbar clean out 11/25  Antimicrobials:  Anti-infectives (From admission, onward)    Start     Dose/Rate Route Frequency Ordered Stop   12/10/20 1554  vancomycin (VANCOCIN) powder  Status:  Discontinued          As needed 12/10/20 1554 12/10/20 1616   12/10/20 1359  vancomycin (VANCOCIN) 1-5  GM/200ML-% IVPB       Note to Pharmacy: Norton Blizzard  : cabinet override      12/10/20 1359 12/10/20 1817   12/07/20 1800  vancomycin (VANCOREADY) IVPB 1000 mg/200 mL        1,000 mg 200 mL/hr over 60 Minutes Intravenous Every 8 hours 12/07/20 1630     12/03/20 1000  vancomycin (VANCOREADY) IVPB 1000 mg/200 mL  Status:  Discontinued        1,000 mg 200 mL/hr over 60 Minutes Intravenous Every 12 hours 12/03/20 0850 12/03/20 0850   12/03/20 1000  vancomycin (VANCOREADY) IVPB 1500 mg/300 mL  Status:  Discontinued        1,500 mg 150 mL/hr over 120 Minutes Intravenous Every 12 hours 12/03/20 0850 12/07/20 1630   12/01/20 1630  ceftaroline (TEFLARO) 600 mg in sodium chloride 0.9 % 100 mL IVPB        600 mg 100 mL/hr over 60 Minutes Intravenous Every 8 hours 12/01/20 1533     12/01/20 0800  cefTRIAXone (ROCEPHIN) 2 g in sodium chloride 0.9 % 100 mL IVPB  Status:  Discontinued        2 g 200 mL/hr over 30 Minutes Intravenous Every 24 hours 11/30/20 2311 12/01/20 0032   12/01/20 0700  vancomycin (VANCOREADY) IVPB 1000 mg/200 mL  Status:  Discontinued        1,000 mg 200 mL/hr over 60 Minutes Intravenous Every 12 hours 11/30/20 2319 12/03/20 0850   12/01/20 0700  ceFEPIme (MAXIPIME) 2 g in sodium chloride 0.9 % 100 mL IVPB  Status:  Discontinued        2 g 200 mL/hr over 30 Minutes Intravenous Every 8 hours 12/01/20 0054 12/01/20 0128   12/01/20 0618  metroNIDAZOLE (FLAGYL) IVPB 500 mg  Status:  Discontinued        500 mg 100 mL/hr over 60 Minutes Intravenous Every 6 hours 12/01/20 0031 12/01/20 1237   12/01/20 0600  cefTRIAXone (ROCEPHIN) 2 g in sodium chloride 0.9 % 100 mL IVPB  Status:  Discontinued        2 g 200 mL/hr over 30 Minutes Intravenous Every 12 hours 12/01/20 0035 12/01/20 0049   12/01/20 0045  cefTRIAXone (ROCEPHIN) 2 g in sodium chloride 0.9 % 100 mL IVPB  Status:  Discontinued        2 g 200 mL/hr over 30 Minutes Intravenous Every 12 hours 12/01/20 0032 12/01/20 0035    12/01/20 0000  metroNIDAZOLE (FLAGYL) IVPB 500 mg  Status:  Discontinued        500 mg 100 mL/hr over 60 Minutes Intravenous Every 12 hours 11/30/20 2311 12/01/20 0031   11/30/20 2300  ceFEPIme (MAXIPIME) 2 g in sodium chloride 0.9 % 100 mL IVPB  Status:  Discontinued        2 g 200 mL/hr over 30 Minutes Intravenous Every 8 hours 11/30/20 1816 12/01/20 0000   11/30/20 1930  acyclovir (ZOVIRAX) 725 mg in dextrose 5 % 150 mL IVPB  Status:  Discontinued        10 mg/kg  72.6 kg 164.5 mL/hr over 60 Minutes Intravenous Every 8 hours 11/30/20 1915 12/01/20 0032   11/30/20 1500  vancomycin (VANCOREADY) IVPB 1750 mg/350 mL        1,750 mg 175 mL/hr over 120 Minutes Intravenous  Once 11/30/20 1443 11/30/20 2200   11/30/20 1445  ceFEPIme (MAXIPIME) 2 g in sodium chloride 0.9 % 100 mL IVPB        2 g 200 mL/hr over 30 Minutes Intravenous  Once 11/30/20 1436 11/30/20 1728   11/30/20 1445  metroNIDAZOLE (FLAGYL) IVPB 500 mg        500 mg 100 mL/hr over 60 Minutes Intravenous  Once 11/30/20 1436 11/30/20 1835   11/30/20 1445  vancomycin (VANCOCIN) IVPB 1000 mg/200 mL premix  Status:  Discontinued        1,000 mg 200 mL/hr over 60 Minutes Intravenous  Once 11/30/20 1436 11/30/20 1443       Objective   Vitals:   12/13/20 2022 12/14/20 0454 12/14/20 0811 12/14/20 1118  BP: 134/75 (!) 133/95 (!) 160/94 113/61  Pulse: 67 61 64 73  Resp: 20 20 16 15   Temp: 98 F (36.7 C) 98 F (36.7 C) (!) 97.5 F (36.4 C) 98 F (36.7 C)  TempSrc:      SpO2: 100% 98% 98% 96%  Weight:      Height:        Intake/Output Summary (Last 24 hours) at 12/14/2020 1410 Last data filed at 12/14/2020 1222 Gross per 24 hour  Intake --  Output 1350 ml  Net -1350 ml    Filed Weights   11/30/20 1311 11/30/20 2216  Weight: 72.6 kg 73.4 kg    Patient BMI: Body mass index is 23.9 kg/m.   Physical Exam: General: awake, alert, NAD, sitting in chair HEENT: atraumatic, clear conjunctiva, anicteric sclera, dry mucus  membranes, hearing grossly normal Cardiovascular: quick capillary refill  Nervous: A&O x3. no gross focal neurologic deficits, normal speech Extremities: moves all equally, no edema, normal tone Skin: dry, intact, normal temperature, normal color, excoriations on forearms improved from admission. Psychiatry: flat mood and affect  Labs   CBC    Component Value Date/Time   WBC 10.0 12/13/2020 0947   RBC 2.62 (L) 12/13/2020 0947   HGB 7.8 (L) 12/13/2020 0947   HCT 23.4 (L) 12/13/2020 0947   PLT 733 (H) 12/13/2020 0947   MCV 89.3 12/13/2020 0947   MCH 29.8 12/13/2020 0947   MCHC 33.3 12/13/2020 0947   RDW 14.8 12/13/2020 0947   LYMPHSABS 2.0 12/06/2020 0436   MONOABS 1.3 (H) 12/06/2020 0436   EOSABS 0.0 12/06/2020 0436   BASOSABS 0.0 12/06/2020 0436   BMP Latest Ref Rng & Units 12/14/2020 12/13/2020 12/11/2020  Glucose 70 - 99 mg/dL - - 125(H)  BUN 6 - 20 mg/dL - - 18  Creatinine 0.61 - 1.24 mg/dL 0.51(L) 0.55(L) 0.58(L)  Sodium 135 - 145 mmol/L - - 135  Potassium 3.5 - 5.1 mmol/L - - 3.8  Chloride 98 - 111 mmol/L - - 103  CO2 22 - 32 mmol/L - - 26  Calcium 8.9 - 10.3 mg/dL - - 7.6(L)   Imaging Studies  No results found. Medications   Scheduled Meds:  amLODipine  10 mg Oral Daily   Chlorhexidine Gluconate Cloth  6 each Topical Q0600   enoxaparin (LOVENOX) injection  40 mg Subcutaneous Q24H   feeding supplement  237 mL Oral TID BM   losartan  50 mg Oral Daily   multivitamin with minerals  1 tablet Oral Daily   polyethylene glycol  17 g Oral Daily   tamsulosin  0.8  mg Oral QPC breakfast   No recently discontinued medications to reconcile  LOS: 14 days   Time spent: >78min  Seraj Dunnam L Katiana Ruland, DO [facility] 12/14/2020, 2:10 PM   Please refer to amion to contact the [facility] Attending or Consulting provider for this pt  [email or web address] Available by Epic secure chat 7AM-7PM. If 7PM-7AM, please contact night-coverage

## 2020-12-14 NOTE — Progress Notes (Signed)
Physical Therapy Treatment Patient Details Name: Caleb Fisher MRN: [id number] DOB: 11-17-1975 Today's Date: 12/14/2020   History of Present Illness Caleb Fisher is a 65yoM presenting with AMS and sepsis. Blood cultures showed MRSA bacteremia. 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 Pt underwent C2-4 right hemilaminectomies for evacuation of epidural on 12/01/20. He was taken for a lumbar decompression on 12/10/2020 for continued infection despite antibiotics. PMH: tobacco use.    PT Comments    Pt agreeable to session, able to advance to 300+ftAMB and performance of 4 stairs for home entry. MinA provided for bed mobility, pt can be modI with elevated surface. RW use safe and steady, no unsteadiness noted. Pain well controlled with activity.    Recommendations for follow up therapy are one component of a multi-disciplinary discharge planning process, led by the attending physician.  Recommendations may be updated based on patient status, additional functional criteria and insurance authorization.  Follow Up Recommendations  Home health PT (pt refusing HHPT/HHOT per TOC notes)     Assistance Recommended at Discharge Frequent or constant Supervision/Assistance  Equipment Recommendations  Rolling walker (2 wheels)    Recommendations for Other Services       Precautions / Restrictions Precautions Precautions: Fall Restrictions Weight Bearing Restrictions: No Other Position/Activity Restrictions: s/p C2-4 right hemilaminectomies     Mobility  Bed Mobility Overal bed mobility: Needs Assistance Bed Mobility: Supine to Sit;Sit to Supine     Supine to sit: Min assist Sit to supine: Min assist        Transfers Overall transfer level: Needs assistance Equipment used: Rolling walker (2 wheels)   Sit to Stand: Min guard;From elevated surface                Ambulation/Gait   Gait Distance (Feet): 320 Feet Assistive device: Rolling walker (2 wheels) Gait  Pattern/deviations: WFL(Within Functional Limits) Gait velocity: 0.45m/s         Stairs Stairs: Yes Stairs assistance: Min guard;Supervision Stair Management: Two rails;One rail Right Number of Stairs: 4     Wheelchair Mobility    Modified Rankin (Stroke Patients Only)       Balance                                            Cognition                                                Exercises      General Comments        Pertinent Vitals/Pain Pain Assessment: Faces Faces Pain Scale: Hurts little more    Home Living                          Prior Function            PT Goals (current goals can now be found in the care plan section) Acute Rehab PT Goals Patient Stated Goal: to get back to normal PT Goal Formulation: With patient Time For Goal Achievement: 12/31/20 Potential to Achieve Goals: Good Progress towards PT goals: Progressing toward goals    Frequency    7X/week      PT Plan Current plan remains appropriate    Co-evaluation  AM-PAC PT "6 Clicks" Mobility   Outcome Measure  Help needed turning from your back to your side while in a flat bed without using bedrails?: A Lot Help needed moving from lying on your back to sitting on the side of a flat bed without using bedrails?: A Lot Help needed moving to and from a bed to a chair (including a wheelchair)?: A Little Help needed standing up from a chair using your arms (e.g., wheelchair or bedside chair)?: A Little Help needed to walk in hospital room?: A Little Help needed climbing 3-5 steps with a railing? : A Little 6 Click Score: 16    End of Session   Activity Tolerance: Patient tolerated treatment well;No increased pain Patient left: with call bell/phone within reach;in bed Nurse Communication: Mobility status PT Visit Diagnosis: Other abnormalities of gait and mobility (R26.89);Pain     Time: 1040-1057 PT  Time Calculation (min) (ACUTE ONLY): 17 min  Charges:  $Gait Training: 8-22 mins                    12:32 PM, 12/14/20 Etta Grandchild, PT, DPT Physical Therapist - [facility]  [phone] ([place])    Eliya Geiman C 12/14/2020, 12:30 PM

## 2020-12-15 ENCOUNTER — Encounter
Admission: EM | Disposition: A | Discharge status: Home Health | Payer: Self-pay | Source: Home / Self Care | Attending: Student in an Organized Health Care Education/Training Program

## 2020-12-15 ENCOUNTER — Encounter: Payer: Self-pay | Admitting: Cardiology

## 2020-12-15 ENCOUNTER — Inpatient Hospital Stay
Admit: 2020-12-15 | Discharge: 2020-12-15 | Disposition: A | Discharge status: Home / Self Care | Payer: BC Managed Care – PPO | Attending: Cardiology | Admitting: Cardiology

## 2020-12-15 ENCOUNTER — Inpatient Hospital Stay: Payer: Self-pay

## 2020-12-15 DIAGNOSIS — E876 Hypokalemia: Secondary | ICD-10-CM | POA: Diagnosis not present

## 2020-12-15 DIAGNOSIS — R7881 Bacteremia: Secondary | ICD-10-CM | POA: Diagnosis not present

## 2020-12-15 DIAGNOSIS — D500 Iron deficiency anemia secondary to blood loss (chronic): Secondary | ICD-10-CM | POA: Clinically undetermined

## 2020-12-15 DIAGNOSIS — R918 Other nonspecific abnormal finding of lung field: Secondary | ICD-10-CM

## 2020-12-15 DIAGNOSIS — A419 Sepsis, unspecified organism: Secondary | ICD-10-CM | POA: Diagnosis not present

## 2020-12-15 DIAGNOSIS — G061 Intraspinal abscess and granuloma: Secondary | ICD-10-CM | POA: Diagnosis not present

## 2020-12-15 HISTORY — PX: TEE WITHOUT CARDIOVERSION: SHX5443

## 2020-12-15 LAB — CBC
HCT: 22.7 % — ABNORMAL LOW (ref 39.0–52.0)
Hemoglobin: 7.4 g/dL — ABNORMAL LOW (ref 13.0–17.0)
MCH: 29.1 pg (ref 26.0–34.0)
MCHC: 32.6 g/dL (ref 30.0–36.0)
MCV: 89.4 fL (ref 80.0–100.0)
Platelets: 630 10*3/uL — ABNORMAL HIGH (ref 150–400)
RBC: 2.54 MIL/uL — ABNORMAL LOW (ref 4.22–5.81)
RDW: 14.6 % (ref 11.5–15.5)
WBC: 10 10*3/uL (ref 4.0–10.5)
nRBC: 0 % (ref 0.0–0.2)

## 2020-12-15 LAB — BASIC METABOLIC PANEL
Anion gap: 2 — ABNORMAL LOW (ref 5–15)
BUN: 15 mg/dL (ref 6–20)
CO2: 28 mmol/L (ref 22–32)
Calcium: 7.6 mg/dL — ABNORMAL LOW (ref 8.9–10.3)
Chloride: 104 mmol/L (ref 98–111)
Creatinine, Ser: 0.52 mg/dL — ABNORMAL LOW (ref 0.61–1.24)
GFR, Estimated: >60 mL/min (ref >60)
Glucose, Bld: 83 mg/dL (ref 70–99)
Potassium: 3.2 mmol/L — ABNORMAL LOW (ref 3.5–5.1)
Sodium: 134 mmol/L — ABNORMAL LOW (ref 135–145)

## 2020-12-15 LAB — MAGNESIUM: Magnesium: 2.1 mg/dL (ref 1.7–2.4)

## 2020-12-15 LAB — RETICULOCYTES
Immature Retic Fract: 9.6 % (ref 2.3–15.9)
RBC.: 2.55 MIL/uL — ABNORMAL LOW (ref 4.22–5.81)
Retic Count, Absolute: 131.5 10*3/uL (ref 19.0–186.0)
Retic Ct Pct: 5.2 % — ABNORMAL HIGH (ref 0.4–3.1)

## 2020-12-15 LAB — ANAEROBIC CULTURE W GRAM STAIN

## 2020-12-15 LAB — VITAMIN B12: Vitamin B-12: 954 pg/mL — ABNORMAL HIGH (ref 180–914)

## 2020-12-15 LAB — IRON AND TIBC
Iron: 12 ug/dL — ABNORMAL LOW (ref 45–182)
Saturation Ratios: 6 % — ABNORMAL LOW (ref 17.9–39.5)
TIBC: 196 ug/dL — ABNORMAL LOW (ref 250–450)
UIBC: 184 ug/dL

## 2020-12-15 LAB — FERRITIN: Ferritin: 138 ng/mL (ref 24–336)

## 2020-12-15 LAB — FOLATE: Folate: 9.2 ng/mL (ref >5.9)

## 2020-12-15 SURGERY — ECHOCARDIOGRAM, TRANSESOPHAGEAL
Anesthesia: Moderate Sedation

## 2020-12-15 MED ORDER — SODIUM CHLORIDE 0.9% FLUSH
10.0000 mL | Freq: Two times a day (BID) | INTRAVENOUS | Status: DC
  Administered 2020-12-15: 20 mL
  Administered 2020-12-16 (×2): 10 mL

## 2020-12-15 MED ORDER — FENTANYL CITRATE (PF) 100 MCG/2ML IJ SOLN
INTRAMUSCULAR | Status: AC
  Filled 2020-12-15: qty 2

## 2020-12-15 MED ORDER — SODIUM CHLORIDE FLUSH 0.9 % IV SOLN
INTRAVENOUS | Status: AC
  Filled 2020-12-15: qty 10

## 2020-12-15 MED ORDER — PEG 3350-KCL-NA BICARB-NACL 420 G PO SOLR
4000.0000 mL | Freq: Once | ORAL | Status: AC
  Administered 2020-12-15: 4000 mL via ORAL
  Filled 2020-12-15: qty 4000

## 2020-12-15 MED ORDER — POTASSIUM CHLORIDE CRYS ER 20 MEQ PO TBCR
40.0000 meq | EXTENDED_RELEASE_TABLET | Freq: Once | ORAL | Status: AC
  Administered 2020-12-15: 40 meq via ORAL
  Filled 2020-12-15: qty 2

## 2020-12-15 MED ORDER — BUTAMBEN-TETRACAINE-BENZOCAINE 2-2-14 % EX AERO
INHALATION_SPRAY | CUTANEOUS | Status: AC | PRN
  Administered 2020-12-15: 1 via TOPICAL

## 2020-12-15 MED ORDER — LIDOCAINE VISCOUS HCL 2 % MT SOLN
OROMUCOSAL | Status: AC | PRN
  Administered 2020-12-15: 15 mL via OROMUCOSAL

## 2020-12-15 MED ORDER — LIDOCAINE VISCOUS HCL 2 % MT SOLN
OROMUCOSAL | Status: AC
  Filled 2020-12-15: qty 15

## 2020-12-15 MED ORDER — SODIUM CHLORIDE 0.9% FLUSH: 10.0000 mL | INTRAVENOUS | Status: DC | PRN

## 2020-12-15 MED ORDER — BUTAMBEN-TETRACAINE-BENZOCAINE 2-2-14 % EX AERO
INHALATION_SPRAY | CUTANEOUS | Status: AC
  Filled 2020-12-15: qty 5

## 2020-12-15 MED ORDER — MIDAZOLAM HCL 2 MG/2ML IJ SOLN
INTRAMUSCULAR | Status: AC
  Filled 2020-12-15: qty 4

## 2020-12-15 MED ORDER — FENTANYL CITRATE (PF) 100 MCG/2ML IJ SOLN
INTRAMUSCULAR | Status: AC | PRN
  Administered 2020-12-15: 25 ug via INTRAVENOUS
  Administered 2020-12-15: 50 ug via INTRAVENOUS
  Administered 2020-12-15: 25 ug via INTRAVENOUS

## 2020-12-15 MED ORDER — SODIUM CHLORIDE 0.9 % IV SOLN: INTRAVENOUS | Status: DC

## 2020-12-15 MED ORDER — HYDROCODONE-ACETAMINOPHEN 5-325 MG PO TABS
1.0000 | ORAL_TABLET | ORAL | Status: DC | PRN
  Administered 2020-12-15 – 2020-12-16 (×4): 1 via ORAL
  Filled 2020-12-15 (×4): qty 1

## 2020-12-15 MED ORDER — MIDAZOLAM HCL 2 MG/2ML IJ SOLN
INTRAMUSCULAR | Status: AC | PRN
  Administered 2020-12-15 (×3): 1 mg via INTRAVENOUS

## 2020-12-15 NOTE — Assessment & Plan Note (Signed)
Due to MRSA bacteremia

## 2020-12-15 NOTE — Hospital Course (Addendum)
Caleb Fisher is a 45 y.o. male with a PMH significant for significant tobacco use. They presented from home to the ED on 11/30/2020 with several days of altered mental status and increased back/neck pain. In the ED, it was found that they had sepsis from unknown source. They were treated with IV fluids and antibiotics.  Neurology was consulted for LP.  Subsequently, patient was found to have MRSA bacteremia.  Imaging revealed cervical epidural abscesses involving C2-C5. He had R 5th digit abscess drained.  Repeat spinal imaging showed more progressive infection including worsening of cervical and involvement of thoracic/lumbar vertebrae.   11/16, and 11/25 neurosurgical procedures for laminectomies. 11/28 had drain removed. Increased mobility and improved pain.  12/14/20 -improved 11/30: TEE negative.  Getting PICC line.  GI consult requested due to anemia 12/1: Getting EGD and colonoscopy for anemia.  Hemoglobin 7.1 getting 1 PRBC transfusion.

## 2020-12-15 NOTE — Procedures (Signed)
   TRANSESOPHAGEAL ECHOCARDIOGRAM   NAME:  Caleb Fisher   MRN: [id number] DOB:  01/12/1976   ADMIT DATE: 11/30/2020  Transesophageal echocardiogram preliminary report  Brodan Grewell [id number] 20-Aug-1975  Preliminary diagnosis  Bacteremia with possible endocarditis  Postprocedural diagnosis Normal LV function without evidence of endocarditis or vegetation  Time out A timeout was performed by the nursing staff and physicians specifically identifying the procedure performed, identification of the patient, the type of sedation, all allergies and medications, all pertinent medical history, and presedation assessment of nasopharynx. The patient and or family understand the risks of the procedure including the rare risks of death, stroke, heart attack, esophogeal perforation, sore throat, and reaction to medications given.  Moderate sedation During this procedure the patient has received Versed 3 milligrams and fentanyl 100 micrograms to achieve appropriate moderate sedation.  The patient had continued monitoring of heart rate, oxygenation, blood pressure, respiratory rate, and extent of signs of sedation throughout the entire procedure.  The patient received this moderate sedation over a period of 15 minutes.  Both the nursing staff and I were present during the procedure when the patient had moderate sedation for 100% of the time.  Treatment considerations  No additional treatment considerations needed for bacteremia due to no current evidence of endocarditis  For further details of transesophageal echocardiogram please refer to final report.  Darrick Huntsman M.D.  12/15/2020 8:18 AM

## 2020-12-15 NOTE — Assessment & Plan Note (Signed)
Presumed source is excoriation on forearms.  Patient has cervical and lumbar spine with epidural abscess and finger abscess.  Status post cervical and lumbar laminectomy L2-L3 with drainage of epidural abscess on 11/25.  Repeat blood culture negative from 11/19 and 11/26.  TEE negative.Getting PICC line placed today.  He will need 6 weeks of IV antibiotics till 01/20/2021.  Likely vancomycin 1 g IV every 8 hours per ID.

## 2020-12-15 NOTE — Assessment & Plan Note (Signed)
Thought to be septic emboli

## 2020-12-15 NOTE — Progress Notes (Signed)
Peripherally Inserted Central Catheter Placement  The IV Nurse has discussed with the patient and/or persons authorized to consent for the patient, the purpose of this procedure and the potential benefits and risks involved with this procedure.  The benefits include less needle sticks, lab draws from the catheter, and the patient may be discharged home with the catheter. Risks include, but not limited to, infection, bleeding, blood clot (thrombus formation), and puncture of an artery; nerve damage and irregular heartbeat and possibility to perform a PICC exchange if needed/ordered by physician.  Alternatives to this procedure were also discussed.  Bard Power PICC patient education guide, fact sheet on infection prevention and patient information card has been provided to patient /or left at bedside.    PICC Placement Documentation  PICC Single Lumen 01/74/94 Right Basilic 37 cm 0 cm (Active)  Indication for Insertion or Continuance of Line Home intravenous therapies (PICC only) 12/15/20 1304  Exposed Catheter (cm) 0 cm 12/15/20 1304  Site Assessment Clean;Dry;Intact 12/15/20 1304  Line Status Flushed;Saline locked;Blood return noted 12/15/20 1304  Dressing Type Transparent 12/15/20 1304  Dressing Status Clean;Dry;Intact 12/15/20 1304  Antimicrobial disc in place? Yes 12/15/20 1304  Dressing Intervention New dressing;Other (Comment) 12/15/20 1304  Dressing Change Due 12/22/20 12/15/20 1304       Caleb Fisher 12/15/2020, 1:06 PM

## 2020-12-15 NOTE — Progress Notes (Signed)
  Progress Note    Caleb Fisher   YCX:[id number]  DOB: Mar 21, 1975  DOA: 11/30/2020     15 Date of Service: 12/15/2020   Clinical Course  Seldon Barrell is a 45 y.o. male with a PMH significant for significant tobacco use. They presented from home to the ED on 11/30/2020 with several days of altered mental status and increased back/neck pain. In the ED, it was found that they had sepsis from unknown source. They were treated with IV fluids and antibiotics.  Neurology was consulted for LP.  Subsequently, patient was found to have MRSA bacteremia.  Imaging revealed cervical epidural abscesses involving C2-C5. He had R 5th digit abscess drained.  Repeat spinal imaging showed more progressive infection including worsening of cervical and involvement of thoracic/lumbar vertebrae.   11/16, and 11/25 neurosurgical procedures for laminectomies. 11/28 had drain removed. Increased mobility and improved pain.  12/14/20 -improved 11/30: TEE negative.  Getting PICC line.  GI consult requested due to anemia    Assessment and Plan * MRSA bacteremia Presumed source is excoriation on forearms.  Patient has cervical and lumbar spine with epidural abscess and finger abscess.  Status post cervical and lumbar laminectomy L2-L3 with drainage of epidural abscess on 11/25.  Repeat blood culture negative from 11/19 and 11/26.  TEE negative.Getting PICC line placed today.  He will need 6 weeks of IV antibiotics till 01/20/2021.  Likely vancomycin 1 g IV every 8 hours per ID.  Pulmonary nodules/lesions, multiple Thought to be septic emboli  Iron deficiency anemia due to chronic blood loss GI consult.  Patient's hemoglobin has been trending down from admission 12.8->7.4.  No obvious bleeding  Acute urinary retention Resolved now.  Continue Flomax  Severe sepsis (HCC) Due to MRSA bacteremia     Subjective:  Sleepy.  No new complaints.  Wants to eat  Objective Vitals:   12/15/20 0830 12/15/20 0845  12/15/20 0903 12/15/20 1117  BP: 138/81 136/84 135/85 136/79  Pulse: (!) 57 62 62 65  Resp: 16 17 16 17   Temp:   98 F (36.7 C) (!) 97.5 F (36.4 C)  TempSrc:      SpO2: 99% 94% 97% 99%  Weight:      Height:       73.4 kg  Vital signs were reviewed and unremarkable.   Exam Physical Exam   General: awake, alert, NAD, sitting in chair HEENT: atraumatic, clear conjunctiva, anicteric sclera, dry mucus membranes, hearing grossly normal Cardiovascular: quick capillary refill  Nervous: A&O x3. no gross focal neurologic deficits, normal speech Extremities: moves all equally, no edema, normal tone Skin: dry, intact, normal temperature, normal color, excoriations on forearms improved from admission. Psychiatry: flat mood and affect  Labs / Other Information My review of labs, imaging, notes and other tests is significant for Low potassium and hemoglobin     Disposition Plan: Status is: Inpatient  Remains inpatient appropriate because: Hemoglobin trending down, pending GI consult.  Potassium getting replaced.  Getting PICC line today for possible discharge with PICC line to get IV antibiotic in next 1 to 2 days depending on GI work-up        Time spent: 35 minutes [facility] 12/15/2020, 12:55 PM

## 2020-12-15 NOTE — Assessment & Plan Note (Signed)
Resolved now.  Continue Flomax

## 2020-12-15 NOTE — Progress Notes (Signed)
Physical Therapy Treatment Patient Details Name: Caleb Fisher MRN: [id number] DOB: [birth year] Today's Date: 12/15/2020   History of Present Illness Caleb Fisher is a 10yoM presenting with AMS and sepsis. Blood cultures showed MRSA bacteremia. 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 Pt underwent C2-4 right hemilaminectomies for evacuation of epidural on 12/01/20. He was taken for a lumbar decompression on 12/10/2020 for continued infection despite antibiotics. PMH: tobacco use.    PT Comments    Pt in bed, awake, agreeable to session. Pain better controlled. Pt optimized for out of room mobility in the setting of contact ISO. Author provides assist in mobility driving IV pole. Pt still unable to stand from standard height, but modI from elevated surface. Frequency decreased to 2x weekly, will encourage AMB out of room with spouse in future.   Recommendations for follow up therapy are one component of a multi-disciplinary discharge planning process, led by the attending physician.  Recommendations may be updated based on patient status, additional functional criteria and insurance authorization.  Follow Up Recommendations  Home health PT (pt refusing)     Assistance Recommended at Discharge Set up Supervision/Assistance  Equipment Recommendations  Rolling walker (2 wheels)    Recommendations for Other Services       Precautions / Restrictions       Mobility  Bed Mobility Overal bed mobility: Modified Independent         Sit to supine: HOB elevated        Transfers Overall transfer level: Needs assistance Equipment used: Rolling walker (2 wheels) Transfers: Sit to/from Stand Sit to Stand: Supervision;From elevated surface                Ambulation/Gait Ambulation/Gait assistance: Modified independent (Device/Increase time);Supervision (for IV management and contact ISO education) Gait Distance (Feet): 500 Feet Assistive device: Rolling walker (2  wheels) Gait Pattern/deviations: WFL(Within Functional Limits) Gait velocity: 0.38m/s  (0.2m/s on 11/29)     General Gait Details: labs indicate ABLA, but no signs of anemia today with AMB, no symptoms, HR 89 post AMB.   Stairs             Wheelchair Mobility    Modified Rankin (Stroke Patients Only)       Balance                                            Cognition                                                Exercises      General Comments        Pertinent Vitals/Pain Pain Assessment: 0-10 Pain Score: 6  (low back with AMB stnading, well tolearnce)    Home Living                          Prior Function            PT Goals (current goals can now be found in the care plan section) Acute Rehab PT Goals Patient Stated Goal: to get back to normal PT Goal Formulation: With patient Time For Goal Achievement: 12/31/20 Potential to Achieve Goals: Good Progress towards PT goals: Progressing toward goals  Frequency    Min 2X/week      PT Plan Current plan remains appropriate;Frequency needs to be updated    Co-evaluation              AM-PAC PT "6 Clicks" Mobility   Outcome Measure  Help needed turning from your back to your side while in a flat bed without using bedrails?: A Little Help needed moving from lying on your back to sitting on the side of a flat bed without using bedrails?: A Little Help needed moving to and from a bed to a chair (including a wheelchair)?: A Little Help needed standing up from a chair using your arms (e.g., wheelchair or bedside chair)?: A Little Help needed to walk in hospital room?: A Little Help needed climbing 3-5 steps with a railing? : A Little 6 Click Score: 18    End of Session   Activity Tolerance: Patient tolerated treatment well;No increased pain Patient left: with call bell/phone within reach;in bed Nurse Communication: Mobility status PT Visit  Diagnosis: Other abnormalities of gait and mobility (R26.89);Pain     Time: 1135-1158 PT Time Calculation (min) (ACUTE ONLY): 23 min  Charges:  $Therapeutic Exercise: 23-37 mins                    4:45 PM, 12/15/20 Etta Grandchild, PT, DPT Physical Therapist - [facility]  [phone] ([place])    [place] 12/15/2020, 4:43 PM

## 2020-12-15 NOTE — Assessment & Plan Note (Signed)
GI consult.  Patient's hemoglobin has been trending down from admission 12.8->7.4.  No obvious bleeding

## 2020-12-15 NOTE — Progress Notes (Signed)
Occupational Therapy Treatment Patient Details Name: Caleb Fisher MRN: [id number] DOB: 1975/08/29 Today's Date: 12/15/2020   History of present illness Caleb Fisher is a 6yoM presenting with AMS and sepsis. Blood cultures showed MRSA bacteremia. 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 Pt underwent C2-4 right hemilaminectomies for evacuation of epidural on 12/01/20. He was taken for a lumbar decompression on 12/10/2020 for continued infection despite antibiotics. PMH: tobacco use.   OT comments  Pt seen for OT Tx this date to f/u re: safety with ADLs/ADL mobility. Pt able to CTS with SUPV and perform fxl mobility with CGA/progress to SUPV around nursing unit x5. He demos G balance and OT engages in cervical rotation to scan environment for fall hazards to add a cognitive element and encourage gentle cervical ROM. Pt tolerates well and demos G pace and safety awareness (only requires one hand placement cue prior to initial stand and otherwise demos safe use of RW). OT re-addresses R hand nerve glides as well as educates pt and spouse re: gentle cervical ROM and gentle stretching of hips to progress back to point of INDEP with LB ADLs. While pt could benefit from [facility] f/u to maximize ADL INDEP, do also want to suggest that, should his R hand pain and limitations continue, he f/u with OPOT to address. Will continue to follow while pt's here acutely as he continues to progress with OT POC.    Recommendations for follow up therapy are one component of a multi-disciplinary discharge planning process, led by the attending physician.  Recommendations may be updated based on patient status, additional functional criteria and insurance authorization.    Follow Up Recommendations  Home health OT (could potentially benefit from eventual f/u with OPOT to address R hand)    Assistance Recommended at Discharge Intermittent Supervision/Assistance  Equipment Recommendations  BSC/3in1;Other  (comment);Tub/shower seat (2ww)    Recommendations for Other Services      Precautions / Restrictions Precautions Precautions: Fall Restrictions Weight Bearing Restrictions: No Other Position/Activity Restrictions: s/p C2-4 right hemilaminectomies       Mobility Bed Mobility Overal bed mobility: Modified Independent         Sit to supine: HOB elevated   General bed mobility comments: up to chair pre/post    Transfers Overall transfer level: Needs assistance Equipment used: Rolling walker (2 wheels) Transfers: Sit to/from Stand Sit to Stand: Supervision           General transfer comment: SUPV to CTS with RW from recliner, one cue for safe hand placement     Balance Overall balance assessment: Needs assistance Sitting-balance support: Feet supported;No upper extremity supported Sitting balance-Leahy Scale: Good     Standing balance support: During functional activity Standing balance-Leahy Scale: Fair Standing balance comment: CGA initially with prorgress to SUPV for fxl mobility with UE support on RW, able to static stand w/ no UE support with G balance                           ADL either performed or assessed with clinical judgement   ADL Overall ADL's : Needs assistance/impaired                     Lower Body Dressing: Minimal assistance;Sit to/from stand Lower Body Dressing Details (indicate cue type and reason): MIN A to don socks, he has fxl hip ROM enough to thread larger items over feet in sitting like underwear with MOD I  Functional mobility during ADLs: Supervision/safety;Min guard;Rolling walker (2 wheels) (CGA intiailly with progress to SUPV with RW for fxl mobility around nursing unit x5)      Extremity/Trunk Assessment Upper Extremity Assessment Upper Extremity Assessment: RUE deficits/detail RUE Deficits / Details: R hand with decreased swelling and pain, improved strength to ~4-/5 grip, while this is an  improvement, pt reports still below baseline. RUE Sensation: WNL LUE Deficits / Details: [facility]            Vision       Perception     Praxis      Cognition Arousal/Alertness: Awake/alert Behavior During Therapy: WFL for tasks assessed/performed Overall Cognitive Status: Within Functional Limits for tasks assessed                                 General Comments: appropriate and participatory          Exercises Other Exercises Other Exercises: OT ed with pt re: cervical ROM adn gentle stretching, hip ROM as it pertains to ADLs and gentle seated stretching, and re-addressed R hand tendon glides and pt with good recall from Monday's OT session.   Shoulder Instructions       General Comments      Pertinent Vitals/ Pain       Pain Assessment: 0-10 Pain Score: 5  Pain Location: mid back Pain Descriptors / Indicators: Aching;Sore Pain Intervention(s): Limited activity within patient's tolerance;Monitored during session;Repositioned  Home Living                                          Prior Functioning/Environment              Frequency  Min 2X/week        Progress Toward Goals  OT Goals(current goals can now be found in the care plan section)  Progress towards OT goals: Progressing toward goals  Acute Rehab OT Goals Patient Stated Goal: to go home OT Goal Formulation: With patient/family Time For Goal Achievement: 12/26/20 Potential to Achieve Goals: Good  Plan Discharge plan remains appropriate    Co-evaluation                 AM-PAC OT "6 Clicks" Daily Activity     Outcome Measure   Help from another person eating meals?: None Help from another person taking care of personal grooming?: A Little Help from another person toileting, which includes using toliet, bedpan, or urinal?: A Little Help from another person bathing (including washing, rinsing, drying)?: A Little Help from another person to put on and  taking off regular upper body clothing?: None Help from another person to put on and taking off regular lower body clothing?: A Little 6 Click Score: 20    End of Session Equipment Utilized During Treatment: Rolling walker (2 wheels)  OT Visit Diagnosis: Unsteadiness on feet (R26.81);Other abnormalities of gait and mobility (R26.89)   Activity Tolerance Patient limited by pain;Patient tolerated treatment well   Patient Left with call bell/phone within reach;in chair;with family/visitor present   Nurse Communication          Time: 2500-3704 OT Time Calculation (min): 33 min  Charges: OT Treatments $Self Care/Home Management : 8-22 mins $Therapeutic Activity: 8-22 mins  Gerrianne Scale, [place], OTR/L ascom [phone] 12/15/20, 6:36 PM

## 2020-12-15 NOTE — Progress Notes (Signed)
Date of Admission:  11/30/2020   T  ID: Caleb Fisher is a 45 y.o. male Principal Problem:   MRSA bacteremia Active Problems:   Abscess in epidural space of cervical spine   Hypertensive urgency   Hypokalemia   Thrombocytopenia (HCC)   Acute metabolic encephalopathy   Severe sepsis (HCC)   Acute urinary retention   Abnormal LFTs   Acute hypercapnic respiratory failure ([place])   Fall   Acute respiratory failure with hypoxia (HCC)   Epidural abscess   Transaminitis   Iron deficiency anemia due to chronic blood loss   Pulmonary nodules/lesions, multiple    Subjective: Pt is doing much better Going for upper GI tomorrow Wants to go home Medications:   amLODipine  10 mg Oral Daily   Chlorhexidine Gluconate Cloth  6 each Topical Q0600   feeding supplement  237 mL Oral TID BM   losartan  50 mg Oral Daily   multivitamin with minerals  1 tablet Oral Daily   polyethylene glycol  17 g Oral BID   senna  1 tablet Oral Daily   sodium chloride flush  10-40 mL Intracatheter Q12H   tamsulosin  0.8 mg Oral QPC breakfast    Objective: Vital signs in last 24 hours: Temp:  [97.5 F (36.4 C)-98.2 F (36.8 C)] 97.9 F (36.6 C) (11/30 2011) Pulse Rate:  [57-99] 58 (11/30 2011) Resp:  [15-22] 15 (11/30 2011) BP: (114-158)/(73-93) 114/78 (11/30 2011) SpO2:  [94 %-99 %] 99 % (11/30 2011) Weight:  [73.4 kg] 73.4 kg (11/30 0744)  PHYSICAL EXAM:  General: Alert, cooperative,  Back: lumbar surgical incision site- no erythema or discharge- lumbar drain has been removed- site covered with dressing Lungs: b/l air entry Heart: Regular rate and rhythm, no murmur, rub or gallop. Abdomen: Soft, non-tender,not distended. Bowel sounds normal. No masses Extremities: atraumatic, no cyanosis. No edema. No clubbing Skin: No rashes or lesions. Or bruising Lymph: Cervical, supraclavicular normal. 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, finger abscess Source excoriated lesions forearms S/p cervical hemilaminectomy Lumbar laminectomy L2-L3 with drainage of epidural abscess on 12/10/20 Continue vancomycin and ceftaroline TEE may not change the duration of antibiotics but if a  vegetation is seen and is large > 1 cm surgical intervention may be needed - TEE scheduled for tomorrow Pt currently on vanco on;ly- Ceftaroline discontinued after 13 days  MRI lumbar spine : L5-S1 discitis /OM L4 osteo Increased T2 signal and enhancement in L5 and at the superior aspect of S1, with fluid and enhancement in the disc space, concerning for osteomyelitis discitis. Additional abnormal signal in the posterior aspect of L4 may represent early osteomyelitis Pt is going to need 6 weeks  of Iv antibiotic-01/20/21. PT and wife okay to do vanco1 gram IV q 8 hrs  Persistent leucocytosis- has resolved  B/L pulmonary nodules- likely from septic emboli  AKI has resolved  Anemia- Hb 7.8  was 12.8 on admission- 5 gram drop over 2 week period- combination of infection, malnutrition, dilution and phlebotomy pt and wife concerned - getting endoscopy tomorrow  Urinary retention- resolved Discussed the management with patient and  wife- OPAT orders placed  Appt made to see me on 12/20 at 9.45 Am  ID will sign off- call if needed

## 2020-12-15 NOTE — H&P (View-Only) (Signed)
GI Inpatient Consult Note  Reason for Consult: Anemia    Attending Requesting Consult: Dr. Max Sane, MD  History of Present Illness: Caleb Fisher is a 45 y.o. male seen for evaluation of anemia at the request of hospitalist - Dr. Max Sane. Pt has a PMH of tobacco abuse. He initially presented from home to the [facility] ED 11/15 for altered mental status and increased neck/back pain where he was found to have severe sepsis 2/2 MRSA bacteremia. He was also found to have cervical epidural abscesses involving C2-C5 and right 5th digit abscess. He is s/p laminectomies with neurosurgery on 11/16 and 11/25. He had TEE performed this morning which was negative. Infectious disease continues to follow patient and he is getting PICC line where he was advised to get 6 weeks of IV antibiotics. GI was consulted this morning for chief complaint of anemia. Review of labs from chart show that his hemoglobin upon admission was 12.8 and has been slowly trending down to a nadir of 7.4 this morning with normocytic indices with MCV 89. Hemoglobin three days ago was 7.7 and one week ago was 8.5. Hemoglobin was 9.4 on 11/20 and 10.4 on 11/19. There has been no overt gastrointestinal blood loss. His last bowel movement was last night per wife and was darker appearing but wife unable to tell me if this was melanotic or just dark brown/green. Per nursing report, there have been no tarry stools. He denies any abdominal pain or abdominal cramping. He denies any nausea, vomiting, esophageal dysphagia, heartburn, reflux, or odynophagia. He does not routinely go to the doctor. He has never had an endoscopy or colonoscopy. No known family history of GI malignancies. He reports appetite and diet have been stable at home. He reports he does go through about 25 BC Powders every two days at home for back pain and headaches. He has been doing this for many years. He smokes appx 2 packs per day.    Past Medical History:  History reviewed.  No pertinent past medical history.  Problem List: Patient Active Problem List   Diagnosis Date Noted   Iron deficiency anemia due to chronic blood loss 12/15/2020   Pulmonary nodules/lesions, multiple 12/15/2020   Acute respiratory failure with hypoxia (HCC)    Epidural abscess    Transaminitis    Abnormal LFTs    Acute hypercapnic respiratory failure ([place])    Fall    Acute urinary retention 12/07/2020   MRSA bacteremia 12/01/2020   Abscess in epidural space of cervical spine 12/01/2020   Hypertensive urgency 12/01/2020   Hypokalemia 12/01/2020   Thrombocytopenia ([place]) 62/69/4854   Acute metabolic encephalopathy 62/70/3500   Severe sepsis ([place]) 12/01/2020   Altered mental status    Encephalopathy acute 11/30/2020    Past Surgical History: Past Surgical History:  Procedure Laterality Date   LUMBAR LAMINECTOMY FOR EPIDURAL ABSCESS N/A 12/10/2020   Procedure: LUMBAR LAMINECTOMY FOR EPIDURAL ABSCESS;  Surgeon: Deetta Perla, MD;  Location: [facility] ORS;  Service: Neurosurgery;  Laterality: N/A;   POSTERIOR CERVICAL LAMINECTOMY FOR EPIDURAL ABSCESS N/A 12/01/2020   Procedure: POSTERIOR CERVICAL LAMINECTOMY FOR EPIDURAL ABSCESS C2-C4;  Surgeon: Meade Maw, MD;  Location: [facility] ORS;  Service: Neurosurgery;  Laterality: N/A;    Allergies: No Known Allergies  Home Medications: Medications Prior to Admission  Medication Sig Dispense Refill Last Dose   Aspirin-Salicylamide-Caffeine (BC HEADACHE POWDER PO) Take by mouth.   11/30/2020   ibuprofen (ADVIL) 800 MG tablet Take 800 mg by mouth every 8 (  eight) hours as needed.   11/30/2020   Home medication reconciliation was completed with the patient.   Scheduled Inpatient Medications:    amLODipine  10 mg Oral Daily   butamben-tetracaine-benzocaine       Chlorhexidine Gluconate Cloth  6 each Topical Q0600   enoxaparin (LOVENOX) injection  40 mg Subcutaneous Q24H   feeding supplement  237 mL Oral TID BM   fentaNYL       lidocaine        losartan  50 mg Oral Daily   midazolam       multivitamin with minerals  1 tablet Oral Daily   polyethylene glycol  17 g Oral BID   senna  1 tablet Oral Daily   sodium chloride flush  10-40 mL Intracatheter Q12H   sodium chloride flush       tamsulosin  0.8 mg Oral QPC breakfast    Continuous Inpatient Infusions:    vancomycin 1,000 mg (12/15/20 1031)    PRN Inpatient Medications:  bisacodyl, HYDROcodone-acetaminophen, LORazepam **OR** LORazepam, methocarbamol, ondansetron **OR** ondansetron (ZOFRAN) IV, sodium chloride flush  Family History: family history is not on file.  The patient's family history is negative for inflammatory bowel disorders, GI malignancy, or solid organ transplantation.  Social History:   reports that he has been smoking cigarettes. He does not have any smokeless tobacco history on file. He reports current alcohol use. The patient denies ETOH, tobacco, or drug use.   Review of Systems: Constitutional: Weight is stable.  Eyes: No changes in vision. ENT: No oral lesions, sore throat.  GI: see HPI.  Heme/Lymph: No easy bruising.  CV: No chest pain.  GU: No hematuria.  Integumentary: No rashes.  Neuro: No headaches.  Psych: No depression/anxiety.  Endocrine: No heat/cold intolerance.  Allergic/Immunologic: No urticaria.  Resp: No cough, SOB.  Musculoskeletal: No joint swelling.    Physical Examination: BP 136/79 (BP Location: Left Arm)   Pulse 65   Temp (!) 97.5 F (36.4 C)   Resp 17   Ht 5\' 9"  (1.753 m)   Wt 73.4 kg   SpO2 99%   BMI 23.90 kg/m  Gen: NAD, alert and oriented x 4 HEENT: PEERLA, EOMI, Neck: supple, no JVD or thyromegaly Chest: CTA bilaterally, no wheezes, crackles, or other adventitious sounds CV: RRR, no m/g/c/r Abd: soft, NT, ND, +BS in all four quadrants; no HSM, guarding, ridigity, or rebound tenderness Ext: no edema, well perfused with 2+ pulses, Skin: no rash or lesions noted Lymph: no LAD  Data: Lab Results   Component Value Date   WBC 10.0 12/15/2020   HGB 7.4 (L) 12/15/2020   HCT 22.7 (L) 12/15/2020   MCV 89.4 12/15/2020   PLT 630 (H) 12/15/2020   Recent Labs  Lab 12/12/20 0418 12/13/20 0947 12/15/20 0150  HGB 7.7* 7.8* 7.4*   Lab Results  Component Value Date   NA 134 (L) 12/15/2020   K 3.2 (L) 12/15/2020   CL 104 12/15/2020   CO2 28 12/15/2020   BUN 15 12/15/2020   CREATININE 0.52 (L) 12/15/2020   Lab Results  Component Value Date   ALT 44 12/11/2020   AST 42 (H) 12/11/2020   ALKPHOS 124 12/11/2020   BILITOT 0.7 12/11/2020   No results for input(s): APTT, INR, PTT in the last 168 hours. Assessment/Plan:  45 y/o Caucasian male with a PMH of tobacco abuse admitted to [facility] on 11/15 for altered mental status and severe sepsis 2/2 MRSA bacteremia. GI consulted in context  for anemia and 5 gram drop in hemoglobin since admission.   Anemia - normocytic indices, 5-gram drop in hemoglobin from 12.8 on admission to nadir of 7.4 this AM. Possible etiologies include GI blood loss from peptic ulcer disease, gastritis, esophagitis, duodenitis, AVMs, polyps, occult neoplasm, etc versus severe nutritional deficiencies, malabsorption, hemolytic anemia, dilutional effect, etc.   MRSA bacteremia - presumed source 2/2 excoriation on forearms. He had cervical and lumbar spine with epidural abscess and finger abscess s/p laminectomies and drainage. Repeat cultures negative. PICC line placed today and will require 6 wks of IV antibiotics per ID.   Severe sepsis - 2/2 MRSA bacteremia, improving   Overuse of NSAIDs - average of 12+ BC Powders daily, increases risk of UGIB  Tobacco abuse  Recommendations:  - No overt gastrointestinal blood loss - Continue to monitor serial H&H. Transfuse for Hgb <7.0.  - Will check ferritin, iron panel, reticulocytes, Y04, and folic acid for anemia work-up - Advise EGD and colonoscopy for further diagnostic work-up and potential endoscopic hemostasis.  Discussed procedure details and indications in hospital room today. He consents to proceed.  - Plan for EGD and colonoscopy with Dr. Alice Reichert tomorrow - Clear liquid diet today. NPO after midnight. Prep orders in for later today.  - Hold Lovenox - See procedure note for findings and further recommendations  I reviewed the risks (including bleeding, perforation, infection, anesthesia complications, cardiac/respiratory complications), benefits and alternatives of EGD and colonoscopy. Patient consents to proceed.    Thank you for the consult. Please call with questions or concerns.  Reeves Forth [facility] [phone] (Cell)

## 2020-12-15 NOTE — Progress Notes (Signed)
*  PRELIMINARY RESULTS* Echocardiogram Echocardiogram Transesophageal has been performed.  [name] 12/15/2020, 8:31 AM

## 2020-12-15 NOTE — Progress Notes (Signed)
PHARMACY CONSULT NOTE FOR:  OUTPATIENT  PARENTERAL ANTIBIOTIC THERAPY (OPAT)  Indication: MRSA bacteremia, epidural abscess Regimen: Vancomycin 1 gram IV q8h End date: 01/18/21  IV antibiotic discharge orders are pended. To discharging provider:  please sign these orders via discharge navigator,  Select New Orders & click on the button choice - Manage This Unsigned Work.     Thank you for allowing pharmacy to be a part of this patient's care.  Draxton Luu A 12/15/2020, 1:35 PM

## 2020-12-15 NOTE — Treatment Plan (Signed)
Diagnosis: MRSA bacteremia  with cervical epidual abscess, lumbar spine epidural abscess, discitis  Baseline Creatinine <1  Culture Result: MRSA  No Known Allergies  OPAT Orders Discharge antibiotics: Vancomycin 1 gram IVPB every 8 hours Per pharmacy protocol  Aim for Vancomycin trough 15-20 (unless otherwise indicated) Duration: 6 weeks total End Date: 01/18/21  [facility] Care Per Protocol:including placement of biopatch and protocol for clearing blocked lines  Labs weekly every Monday while on IV antibiotics: _X_ CBC with differential _X_ CMP _X_ CRP _X_ ESR _X_ Vancomycin trough  Labs weekly every Thursday while on IV antibiotics X Vanco trough X BMP  _X_ Please pull PIC at completion of IV antibiotics   Fax weekly labs promptly t0 [place] [phone]  Clinic Follow Up Appt:01/04/21 at 9.45Am   Call [phone] with any questions or concerns

## 2020-12-15 NOTE — Consult Note (Signed)
GI Inpatient Consult Note  Reason for Consult: Anemia    Attending Requesting Consult: Dr. Max Sane, MD  History of Present Illness: Caleb Fisher is a 45 y.o. male seen for evaluation of anemia at the request of hospitalist - Dr. Max Sane. Pt has a PMH of tobacco abuse. He initially presented from home to the [facility] ED 11/15 for altered mental status and increased neck/back pain where he was found to have severe sepsis 2/2 MRSA bacteremia. He was also found to have cervical epidural abscesses involving C2-C5 and right 5th digit abscess. He is s/p laminectomies with neurosurgery on 11/16 and 11/25. He had TEE performed this morning which was negative. Infectious disease continues to follow patient and he is getting PICC line where he was advised to get 6 weeks of IV antibiotics. GI was consulted this morning for chief complaint of anemia. Review of labs from chart show that his hemoglobin upon admission was 12.8 and has been slowly trending down to a nadir of 7.4 this morning with normocytic indices with MCV 89. Hemoglobin three days ago was 7.7 and one week ago was 8.5. Hemoglobin was 9.4 on 11/20 and 10.4 on 11/19. There has been no overt gastrointestinal blood loss. His last bowel movement was last night per wife and was darker appearing but wife unable to tell me if this was melanotic or just dark brown/green. Per nursing report, there have been no tarry stools. He denies any abdominal pain or abdominal cramping. He denies any nausea, vomiting, esophageal dysphagia, heartburn, reflux, or odynophagia. He does not routinely go to the doctor. He has never had an endoscopy or colonoscopy. No known family history of GI malignancies. He reports appetite and diet have been stable at home. He reports he does go through about 25 BC Powders every two days at home for back pain and headaches. He has been doing this for many years. He smokes appx 2 packs per day.    Past Medical History:  History reviewed.  No pertinent past medical history.  Problem List: Patient Active Problem List   Diagnosis Date Noted   Iron deficiency anemia due to chronic blood loss 12/15/2020   Pulmonary nodules/lesions, multiple 12/15/2020   Acute respiratory failure with hypoxia (HCC)    Epidural abscess    Transaminitis    Abnormal LFTs    Acute hypercapnic respiratory failure ([place])    Fall    Acute urinary retention 12/07/2020   MRSA bacteremia 12/01/2020   Abscess in epidural space of cervical spine 12/01/2020   Hypertensive urgency 12/01/2020   Hypokalemia 12/01/2020   Thrombocytopenia ([place]) 69/62/9528   Acute metabolic encephalopathy 41/32/4401   Severe sepsis ([place]) 12/01/2020   Altered mental status    Encephalopathy acute 11/30/2020    Past Surgical History: Past Surgical History:  Procedure Laterality Date   LUMBAR LAMINECTOMY FOR EPIDURAL ABSCESS N/A 12/10/2020   Procedure: LUMBAR LAMINECTOMY FOR EPIDURAL ABSCESS;  Surgeon: Deetta Perla, MD;  Location: [facility] ORS;  Service: Neurosurgery;  Laterality: N/A;   POSTERIOR CERVICAL LAMINECTOMY FOR EPIDURAL ABSCESS N/A 12/01/2020   Procedure: POSTERIOR CERVICAL LAMINECTOMY FOR EPIDURAL ABSCESS C2-C4;  Surgeon: Meade Maw, MD;  Location: [facility] ORS;  Service: Neurosurgery;  Laterality: N/A;    Allergies: No Known Allergies  Home Medications: Medications Prior to Admission  Medication Sig Dispense Refill Last Dose   Aspirin-Salicylamide-Caffeine (BC HEADACHE POWDER PO) Take by mouth.   11/30/2020   ibuprofen (ADVIL) 800 MG tablet Take 800 mg by mouth every 8 (  eight) hours as needed.   11/30/2020   Home medication reconciliation was completed with the patient.   Scheduled Inpatient Medications:    amLODipine  10 mg Oral Daily   butamben-tetracaine-benzocaine       Chlorhexidine Gluconate Cloth  6 each Topical Q0600   enoxaparin (LOVENOX) injection  40 mg Subcutaneous Q24H   feeding supplement  237 mL Oral TID BM   fentaNYL       lidocaine        losartan  50 mg Oral Daily   midazolam       multivitamin with minerals  1 tablet Oral Daily   polyethylene glycol  17 g Oral BID   senna  1 tablet Oral Daily   sodium chloride flush  10-40 mL Intracatheter Q12H   sodium chloride flush       tamsulosin  0.8 mg Oral QPC breakfast    Continuous Inpatient Infusions:    vancomycin 1,000 mg (12/15/20 1031)    PRN Inpatient Medications:  bisacodyl, HYDROcodone-acetaminophen, LORazepam **OR** LORazepam, methocarbamol, ondansetron **OR** ondansetron (ZOFRAN) IV, sodium chloride flush  Family History: family history is not on file.  The patient's family history is negative for inflammatory bowel disorders, GI malignancy, or solid organ transplantation.  Social History:   reports that he has been smoking cigarettes. He does not have any smokeless tobacco history on file. He reports current alcohol use. The patient denies ETOH, tobacco, or drug use.   Review of Systems: Constitutional: Weight is stable.  Eyes: No changes in vision. ENT: No oral lesions, sore throat.  GI: see HPI.  Heme/Lymph: No easy bruising.  CV: No chest pain.  GU: No hematuria.  Integumentary: No rashes.  Neuro: No headaches.  Psych: No depression/anxiety.  Endocrine: No heat/cold intolerance.  Allergic/Immunologic: No urticaria.  Resp: No cough, SOB.  Musculoskeletal: No joint swelling.    Physical Examination: BP 136/79 (BP Location: Left Arm)   Pulse 65   Temp (!) 97.5 F (36.4 C)   Resp 17   Ht 5\' 9"  (1.753 m)   Wt 73.4 kg   SpO2 99%   BMI 23.90 kg/m  Gen: NAD, alert and oriented x 4 HEENT: PEERLA, EOMI, Neck: supple, no JVD or thyromegaly Chest: CTA bilaterally, no wheezes, crackles, or other adventitious sounds CV: RRR, no m/g/c/r Abd: soft, NT, ND, +BS in all four quadrants; no HSM, guarding, ridigity, or rebound tenderness Ext: no edema, well perfused with 2+ pulses, Skin: no rash or lesions noted Lymph: no LAD  Data: Lab Results   Component Value Date   WBC 10.0 12/15/2020   HGB 7.4 (L) 12/15/2020   HCT 22.7 (L) 12/15/2020   MCV 89.4 12/15/2020   PLT 630 (H) 12/15/2020   Recent Labs  Lab 12/12/20 0418 12/13/20 0947 12/15/20 0150  HGB 7.7* 7.8* 7.4*   Lab Results  Component Value Date   NA 134 (L) 12/15/2020   K 3.2 (L) 12/15/2020   CL 104 12/15/2020   CO2 28 12/15/2020   BUN 15 12/15/2020   CREATININE 0.52 (L) 12/15/2020   Lab Results  Component Value Date   ALT 44 12/11/2020   AST 42 (H) 12/11/2020   ALKPHOS 124 12/11/2020   BILITOT 0.7 12/11/2020   No results for input(s): APTT, INR, PTT in the last 168 hours. Assessment/Plan:  45 y/o Caucasian male with a PMH of tobacco abuse admitted to [facility] on 11/15 for altered mental status and severe sepsis 2/2 MRSA bacteremia. GI consulted in context  for anemia and 5 gram drop in hemoglobin since admission.   Anemia - normocytic indices, 5-gram drop in hemoglobin from 12.8 on admission to nadir of 7.4 this AM. Possible etiologies include GI blood loss from peptic ulcer disease, gastritis, esophagitis, duodenitis, AVMs, polyps, occult neoplasm, etc versus severe nutritional deficiencies, malabsorption, hemolytic anemia, dilutional effect, etc.   MRSA bacteremia - presumed source 2/2 excoriation on forearms. He had cervical and lumbar spine with epidural abscess and finger abscess s/p laminectomies and drainage. Repeat cultures negative. PICC line placed today and will require 6 wks of IV antibiotics per ID.   Severe sepsis - 2/2 MRSA bacteremia, improving   Overuse of NSAIDs - average of 12+ BC Powders daily, increases risk of UGIB  Tobacco abuse  Recommendations:  - No overt gastrointestinal blood loss - Continue to monitor serial H&H. Transfuse for Hgb <7.0.  - Will check ferritin, iron panel, reticulocytes, F54, and folic acid for anemia work-up - Advise EGD and colonoscopy for further diagnostic work-up and potential endoscopic hemostasis.  Discussed procedure details and indications in hospital room today. He consents to proceed.  - Plan for EGD and colonoscopy with Dr. Alice Reichert tomorrow - Clear liquid diet today. NPO after midnight. Prep orders in for later today.  - Hold Lovenox - See procedure note for findings and further recommendations  I reviewed the risks (including bleeding, perforation, infection, anesthesia complications, cardiac/respiratory complications), benefits and alternatives of EGD and colonoscopy. Patient consents to proceed.    Thank you for the consult. Please call with questions or concerns.  Reeves Forth [facility] [phone] (Cell)

## 2020-12-15 NOTE — Sedation Documentation (Signed)
Pt. Tolerated procedure well. No complications. Pt. Responding appropriately immed. Post procedure.

## 2020-12-16 ENCOUNTER — Inpatient Hospital Stay: Payer: BC Managed Care – PPO | Admitting: Anesthesiology

## 2020-12-16 ENCOUNTER — Encounter
Admission: EM | Disposition: A | Discharge status: Home Health | Payer: Self-pay | Source: Home / Self Care | Attending: Student in an Organized Health Care Education/Training Program

## 2020-12-16 ENCOUNTER — Encounter: Payer: Self-pay | Admitting: Student in an Organized Health Care Education/Training Program

## 2020-12-16 HISTORY — PX: ESOPHAGOGASTRODUODENOSCOPY: SHX5428

## 2020-12-16 HISTORY — PX: COLONOSCOPY: SHX5424

## 2020-12-16 LAB — PREPARE RBC (CROSSMATCH)

## 2020-12-16 LAB — CBC
HCT: 21.7 % — ABNORMAL LOW (ref 39.0–52.0)
Hemoglobin: 7.1 g/dL — ABNORMAL LOW (ref 13.0–17.0)
MCH: 29.7 pg (ref 26.0–34.0)
MCHC: 32.7 g/dL (ref 30.0–36.0)
MCV: 90.8 fL (ref 80.0–100.0)
Platelets: 526 10*3/uL — ABNORMAL HIGH (ref 150–400)
RBC: 2.39 MIL/uL — ABNORMAL LOW (ref 4.22–5.81)
RDW: 14.4 % (ref 11.5–15.5)
WBC: 8.3 10*3/uL (ref 4.0–10.5)
nRBC: 0 % (ref 0.0–0.2)

## 2020-12-16 LAB — BASIC METABOLIC PANEL
Anion gap: 3 — ABNORMAL LOW (ref 5–15)
BUN: 11 mg/dL (ref 6–20)
CO2: 30 mmol/L (ref 22–32)
Calcium: 7.9 mg/dL — ABNORMAL LOW (ref 8.9–10.3)
Chloride: 104 mmol/L (ref 98–111)
Creatinine, Ser: 0.53 mg/dL — ABNORMAL LOW (ref 0.61–1.24)
GFR, Estimated: >60 mL/min (ref >60)
Glucose, Bld: 88 mg/dL (ref 70–99)
Potassium: 3.7 mmol/L (ref 3.5–5.1)
Sodium: 137 mmol/L (ref 135–145)

## 2020-12-16 LAB — CULTURE, BLOOD (ROUTINE X 2)
Culture: NO GROWTH
Culture: NO GROWTH
Special Requests: ADEQUATE
Special Requests: ADEQUATE

## 2020-12-16 SURGERY — EGD (ESOPHAGOGASTRODUODENOSCOPY)
Anesthesia: General

## 2020-12-16 MED ORDER — SODIUM CHLORIDE 0.9 % IV SOLN: INTRAVENOUS | Status: DC

## 2020-12-16 MED ORDER — PROPOFOL 500 MG/50ML IV EMUL
INTRAVENOUS | Status: AC
  Filled 2020-12-16: qty 50

## 2020-12-16 MED ORDER — HYDROCODONE-ACETAMINOPHEN 5-325 MG PO TABS
1.0000 | ORAL_TABLET | Freq: Four times a day (QID) | ORAL | Status: DC | PRN
  Administered 2020-12-16 – 2020-12-17 (×3): 1 via ORAL
  Filled 2020-12-16 (×3): qty 1

## 2020-12-16 MED ORDER — PROPOFOL 10 MG/ML IV BOLUS
INTRAVENOUS | Status: DC | PRN
  Administered 2020-12-16: 50 mg via INTRAVENOUS

## 2020-12-16 MED ORDER — PROPOFOL 500 MG/50ML IV EMUL
INTRAVENOUS | Status: DC | PRN
  Administered 2020-12-16: 120 ug/kg/min via INTRAVENOUS

## 2020-12-16 MED ORDER — SODIUM CHLORIDE 0.9% IV SOLUTION: Freq: Once | INTRAVENOUS | Status: AC

## 2020-12-16 MED ORDER — LIDOCAINE HCL (PF) 2 % IJ SOLN
INTRAMUSCULAR | Status: AC
  Filled 2020-12-16: qty 5

## 2020-12-16 MED ORDER — LIDOCAINE HCL (CARDIAC) PF 100 MG/5ML IV SOSY
PREFILLED_SYRINGE | INTRAVENOUS | Status: DC | PRN
  Administered 2020-12-16: 100 mg via INTRAVENOUS

## 2020-12-16 MED ORDER — EPHEDRINE SULFATE 50 MG/ML IJ SOLN
INTRAMUSCULAR | Status: DC | PRN
  Administered 2020-12-16: 5 mg via INTRAVENOUS

## 2020-12-16 NOTE — Anesthesia Postprocedure Evaluation (Signed)
Anesthesia Post Note  Patient: Caleb Fisher  Procedure(s) Performed: ESOPHAGOGASTRODUODENOSCOPY (EGD) COLONOSCOPY  Patient location during evaluation: PACU Anesthesia Type: General Level of consciousness: awake and alert, oriented and patient cooperative Pain management: pain level controlled Vital Signs Assessment: post-procedure vital signs reviewed and stable Respiratory status: spontaneous breathing, nonlabored ventilation and respiratory function stable Cardiovascular status: blood pressure returned to baseline and stable Postop Assessment: adequate PO intake Anesthetic complications: no   No notable events documented.   Last Vitals:  Vitals:   12/16/20 1200 12/16/20 1319  BP: (!) 138/95 130/70  Pulse: (!) 58   Resp: 15   Temp: 36.4 C (!) 36.3 C  SpO2: 99%     Last Pain:  Vitals:   12/16/20 1349  TempSrc:   PainSc: 0-No pain                 Darrin Nipper

## 2020-12-16 NOTE — Assessment & Plan Note (Signed)
GI performing EGD and colonoscopy today.  Patient's hemoglobin has been trending down from admission 12.8->7.1.  No obvious bleeding.  I have ordered 1 PRBC transfusion for hemoglobin of 7.1 today

## 2020-12-16 NOTE — Transfer of Care (Signed)
Immediate Anesthesia Transfer of Care Note  Patient: Caleb Fisher  Procedure(s) Performed: ESOPHAGOGASTRODUODENOSCOPY (EGD) COLONOSCOPY  Patient Location: PACU and Endoscopy Unit  Anesthesia Type:General  Level of Consciousness: drowsy  Airway & Oxygen Therapy: Patient Spontanous Breathing  Post-op Assessment: Report given to RN  Post vital signs: stable  Last Vitals:  Vitals Value Taken Time  BP 130/70 12/16/20 1319  Temp 36.3 C 12/16/20 1319  Pulse 63 12/16/20 1320  Resp 16 12/16/20 1320  SpO2 97 % 12/16/20 1320  Vitals shown include unvalidated device data.  Last Pain:  Vitals:   12/16/20 1319  TempSrc: Tympanic  PainSc: Asleep      Patients Stated Pain Goal: 0 (78/97/84 7841)  Complications: No notable events documented.

## 2020-12-16 NOTE — Assessment & Plan Note (Signed)
Thought to be septic emboli.

## 2020-12-16 NOTE — Anesthesia Preprocedure Evaluation (Addendum)
Anesthesia Evaluation  Patient identified by MRN, date of birth, ID band Patient awake    Reviewed: Allergy & Precautions, NPO status , Patient's Chart, lab work & pertinent test results  History of Anesthesia Complications Negative for: history of anesthetic complications  Airway Mallampati: II   Neck ROM: Full    Dental   Missing few molars:   Pulmonary Current Smoker (2 ppd) and Patient abstained from smoking.,  Multiple septic emboli   Pulmonary exam normal breath sounds clear to auscultation       Cardiovascular hypertension, Normal cardiovascular exam Rhythm:Regular Rate:Normal  ECG 11/30/20:  Long QTc Sinus tachycardia Possible Anterior infarct , age undetermined   Neuro/Psych C2-5 epidural abscess s/p laminectomy 12/10/20    GI/Hepatic negative GI ROS,   Endo/Other  negative endocrine ROS  Renal/GU negative Renal ROS     Musculoskeletal   Abdominal   Peds  Hematology  (+) Blood dyscrasia, anemia ,   Anesthesia Other Findings Sepsis 2/2 MRSA bacteremia  Reproductive/Obstetrics                            Anesthesia Physical Anesthesia Plan  ASA: 4  Anesthesia Plan: General   Post-op Pain Management:    Induction: Intravenous  PONV Risk Score and Plan: 1 and Propofol infusion, TIVA and Treatment may vary due to age or medical condition  Airway Management Planned: Natural Airway  Additional Equipment:   Intra-op Plan:   Post-operative Plan:   Informed Consent: I have reviewed the patients History and Physical, chart, labs and discussed the procedure including the risks, benefits and alternatives for the proposed anesthesia with the patient or authorized representative who has indicated his/her understanding and acceptance.       Plan Discussed with: CRNA  Anesthesia Plan Comments: (LMA/GETA backup discussed.  Patient consented for risks of anesthesia including  but not limited to:  - adverse reactions to medications - damage to eyes, teeth, lips or other oral mucosa - nerve damage due to positioning  - sore throat or hoarseness - damage to heart, brain, nerves, lungs, other parts of body or loss of life  Informed patient about role of CRNA in peri- and intra-operative care.  Patient voiced understanding.)        Anesthesia Quick Evaluation

## 2020-12-16 NOTE — Interval H&P Note (Signed)
History and Physical Interval Note:  12/16/2020 12:47 PM  Caleb Fisher  has presented today for surgery, with the diagnosis of Anemia, overuse of NSAIDs.  The various methods of treatment have been discussed with the patient and family. After consideration of risks, benefits and other options for treatment, the patient has consented to  Procedure(s): ESOPHAGOGASTRODUODENOSCOPY (EGD) (N/A) COLONOSCOPY (N/A) as a surgical intervention.  The patient's history has been reviewed, patient examined, no change in status, stable for surgery.  I have reviewed the patient's chart and labs.  Questions were answered to the patient's satisfaction.     [place], [place]

## 2020-12-16 NOTE — Assessment & Plan Note (Signed)
Presumed source is excoriation on forearms.  Patient has cervical and lumbar spine with epidural abscess and finger abscess.  Status post cervical and lumbar laminectomy L2-L3 with drainage of epidural abscess on 11/25.  Repeat blood culture negative from 11/19 and 11/26.  TEE negative PICC line in place on 11/30. he will need 6 weeks of IV antibiotics till 01/20/2021.  Likely vancomycin 1 g IV every 8 hours per ID.  OPAT orders in place for discharge tomorrow

## 2020-12-16 NOTE — Progress Notes (Signed)
  Progress Note    Caleb Fisher   MOQ:[id number]  DOB: 1976/01/16  DOA: 11/30/2020     16 Date of Service: 12/16/2020   Clinical Course  Caleb Fisher is a 45 y.o. male with a PMH significant for significant tobacco use. They presented from home to the ED on 11/30/2020 with several days of altered mental status and increased back/neck pain. In the ED, it was found that they had sepsis from unknown source. They were treated with IV fluids and antibiotics.  Neurology was consulted for LP.  Subsequently, patient was found to have MRSA bacteremia.  Imaging revealed cervical epidural abscesses involving C2-C5. He had R 5th digit abscess drained.  Repeat spinal imaging showed more progressive infection including worsening of cervical and involvement of thoracic/lumbar vertebrae.   11/16, and 11/25 neurosurgical procedures for laminectomies. 11/28 had drain removed. Increased mobility and improved pain.  12/14/20 -improved 11/30: TEE negative.  Getting PICC line.  GI consult requested due to anemia 12/1: Getting EGD and colonoscopy for anemia.  Hemoglobin 7.1 getting 1 PRBC transfusion.    Assessment and Plan * MRSA bacteremia Presumed source is excoriation on forearms.  Patient has cervical and lumbar spine with epidural abscess and finger abscess.  Status post cervical and lumbar laminectomy L2-L3 with drainage of epidural abscess on 11/25.  Repeat blood culture negative from 11/19 and 11/26.  TEE negative PICC line in place on 11/30. he will need 6 weeks of IV antibiotics till 01/20/2021.  Likely vancomycin 1 g IV every 8 hours per ID.  OPAT orders in place for discharge tomorrow  Pulmonary nodules/lesions, multiple Thought to be septic emboli.  Iron deficiency anemia due to chronic blood loss GI performing EGD and colonoscopy today.  Patient's hemoglobin has been trending down from admission 12.8->7.1.  No obvious bleeding.  I have ordered 1 PRBC transfusion for hemoglobin of 7.1  today  Acute urinary retention Resolved now.  Continue Flomax.  Severe sepsis ([place]) Due to MRSA bacteremia.  Improving with treatment     Subjective:  No new issues.  Drinking colonoscopy prep.  Wife at bedside.  Hemoglobin 7.1  Objective Vitals:   12/16/20 0509 12/16/20 0748 12/16/20 1100 12/16/20 1131  BP: 140/79 123/80 133/81 128/81  Pulse: (!) 51 (!) 52 (!) 56 (!) 55  Resp: 18 18 19 19   Temp: 97.6 F (36.4 C) 98 F (36.7 C) 97.8 F (36.6 C) (!) 97.5 F (36.4 C)  TempSrc:   Oral Oral  SpO2: 97% 98% 96% 96%  Weight:      Height:       73.4 kg  Vital signs were reviewed and unremarkable.   Exam Physical Exam   General: awake, alert, NAD, sitting in chair HEENT: atraumatic, pale conjunctiva, anicteric sclera, dry mucus membranes, hearing grossly normal Cardiovascular:quick capillary refill  Nervous:A&O x3. no gross focal neurologic deficits, normal speech Extremities: moves all equally, no edema, normal tone Skin: dry, intact, normal temperature, normal color, excoriations on forearms improved from admission. Psychiatry: Normal mood and affect  Labs / Other Information My review of labs, imaging, notes and other tests is significant for Anemia with hemoglobin of 7.1     Disposition Plan: Status is: Inpatient  Remains inpatient appropriate because: Likely discharge tomorrow depending on EGD and colonoscopy findings today and his clinical condition    Wife updated at bedside    Time spent: 35 minutes [facility] 12/16/2020, 11:57 AM

## 2020-12-16 NOTE — Progress Notes (Signed)
PT Cancellation Note  Patient Details Name: Caleb Fisher MRN: [id number] DOB: 04-17-1975   Cancelled Treatment:    Reason Eval/Treat Not Completed: Patient declined, no reason specified. Pt supine in bed upon entry, drinking prep fluid for colonoscopy. Requests PT come back later. Will follow up with therapy intervention later today, with time permitting.   Herminio Commons, PT, DPT 9:46 AM,12/16/20

## 2020-12-16 NOTE — Op Note (Signed)
[facility] Gastroenterology Patient Name: Caleb Fisher Procedure Date: 12/16/2020 12:13 PM MRN: [id number] Account #: [id number] Date of Birth: 06-29-1975 Admit Type: Outpatient Age: 45 Room: [facility] ENDO ROOM 1 Gender: Male Note Status: Finalized Instrument Name: Upper Endoscope [id number] Procedure:             Upper GI endoscopy Indications:           Acute post hemorrhagic anemia, Melena Providers:             Benay Pike. Alice Reichert MD, MD Referring MD:          No Local Md, MD (Referring MD) Medicines:             Propofol per Anesthesia Complications:         No immediate complications. Procedure:             Pre-Anesthesia Assessment:                        - The risks and benefits of the procedure and the                         sedation options and risks were discussed with the                         patient. All questions were answered and informed                         consent was obtained.                        - Patient identification and proposed procedure were                         verified prior to the procedure by the nurse. The                         procedure was verified in the procedure room.                        - ASA Grade Assessment: III - A patient with severe                         systemic disease.                        - After reviewing the risks and benefits, the patient                         was deemed in satisfactory condition to undergo the                         procedure.                        After obtaining informed consent, the endoscope was                         passed under direct vision. Throughout the procedure,  the patient's blood pressure, pulse, and oxygen                         saturations were monitored continuously. The Endoscope                         was introduced through the mouth, and advanced to the                         third part of duodenum. The upper GI endoscopy was                          accomplished without difficulty. The patient tolerated                         the procedure well. Findings:      The esophagus was normal.      Patchy mild inflammation characterized by congestion (edema) and       erythema was found in the gastric antrum. Biopsies were taken with a       cold forceps for Helicobacter pylori testing.      One non-bleeding cratered gastric ulcer with no stigmata of bleeding was       found in the prepyloric region of the stomach. The lesion was 12 mm in       largest dimension.      The cardia and gastric fundus were normal on retroflexion.      Patchy mild inflammation characterized by congestion (edema) and       erythema was found in the duodenal bulb.      The second portion of the duodenum and third portion of the duodenum       were normal.      The exam was otherwise without abnormality. Impression:            - Normal esophagus.                        - Gastritis. Biopsied.                        - Non-bleeding gastric ulcer with no stigmata of                         bleeding.                        - Duodenitis.                        - Normal second portion of the duodenum and third                         portion of the duodenum.                        - The examination was otherwise normal. Recommendation:        - Await pathology results.                        - Use Protonix (pantoprazole) 40 mg PO daily.                        -  No aspirin, ibuprofen, naproxen, or other                         non-steroidal anti-inflammatory drugs.                        - Proceed with colonoscopy Procedure Code(s):     --- Professional ---                        [phone], Esophagogastroduodenoscopy, flexible,                         transoral; with biopsy, single or multiple Diagnosis Code(s):     --- Professional ---                        K92.1, Melena (includes Hematochezia)                        D62, Acute posthemorrhagic  anemia                        K29.80, Duodenitis without bleeding                        K25.9, Gastric ulcer, unspecified as acute or chronic,                         without hemorrhage or perforation                        K29.70, Gastritis, unspecified, without bleeding CPT copyright 2019 American Medical Association. All rights reserved. The codes documented in this report are preliminary and upon coder review may  be revised to meet current compliance requirements. Efrain Sella MD, MD 12/16/2020 1:02:28 PM This report has been signed electronically. Number of Addenda: 0 Note Initiated On: 12/16/2020 12:13 PM Estimated Blood Loss:  Estimated blood loss: none.      [facility]

## 2020-12-16 NOTE — Progress Notes (Signed)
Mobility Specialist - Progress Note   12/16/20 1322  Mobility  Activity Off unit  Mobility performed by Mobility specialist    Pt off unit at this time for EGD. Will attempt session another date/time.    Kathee Delton Mobility Specialist 12/16/20, 1:28 PM

## 2020-12-16 NOTE — Assessment & Plan Note (Signed)
Due to MRSA bacteremia.  Improving with treatment

## 2020-12-16 NOTE — Op Note (Signed)
[facility] Gastroenterology Patient Name: Caleb Fisher Procedure Date: 12/16/2020 12:12 PM MRN: [id number] Account #: [id number] Date of Birth: 06/12/1975 Admit Type: Outpatient Age: 45 Room: [facility] ENDO ROOM 1 Gender: Male Note Status: Finalized Instrument Name: Park Meo [id number] Procedure:             Colonoscopy Indications:           Melena, Acute post hemorrhagic anemia Providers:             Benay Pike. Alice Reichert MD, MD Referring MD:          No Local Md, MD (Referring MD) Medicines:             Propofol per Anesthesia Complications:         No immediate complications. Procedure:             Pre-Anesthesia Assessment:                        - The risks and benefits of the procedure and the                         sedation options and risks were discussed with the                         patient. All questions were answered and informed                         consent was obtained.                        - Patient identification and proposed procedure were                         verified prior to the procedure by the nurse. The                         procedure was verified in the procedure room.                        - ASA Grade Assessment: III - A patient with severe                         systemic disease.                        - After reviewing the risks and benefits, the patient                         was deemed in satisfactory condition to undergo the                         procedure.                        After obtaining informed consent, the colonoscope was                         passed under direct vision. Throughout the procedure,  the patient's blood pressure, pulse, and oxygen                         saturations were monitored continuously. The                         Colonoscope was introduced through the anus and                         advanced to the the cecum, identified by appendiceal                          orifice and ileocecal valve. The colonoscopy was                         performed without difficulty. The patient tolerated                         the procedure well. The quality of the bowel                         preparation was adequate. The ileocecal valve,                         appendiceal orifice, and rectum were photographed. Findings:      The perianal and digital rectal examinations were normal. Pertinent       negatives include normal sphincter tone and no palpable rectal lesions.      Non-bleeding internal hemorrhoids were found during retroflexion. The       hemorrhoids were Grade I (internal hemorrhoids that do not prolapse).      A 4 mm polyp was found in the transverse colon. The polyp was sessile.       The polyp was removed with a cold biopsy forceps. Resection and       retrieval were complete.      A 7 mm polyp was found in the cecum. The polyp was sessile. The polyp       was removed with a hot snare. Resection and retrieval were complete.      The exam was otherwise without abnormality. Impression:            - Non-bleeding internal hemorrhoids.                        - One 4 mm polyp in the transverse colon, removed with                         a cold biopsy forceps. Resected and retrieved.                        - One 7 mm polyp in the cecum, removed with a hot                         snare. Resected and retrieved.                        - The examination was otherwise normal. Recommendation:        - Await pathology results from EGD, also performed  today.                        - Use Protonix (pantoprazole) 40 mg PO daily for                         gastric ulcer found on EGD today.                        - Return patient to hospital ward for ongoing care.                        - Advance diet as tolerated.                        - Return to my office in 4 weeks.                        - [employer] will sign off for now. Call us back if any                          changes clinically or if we can help for any reason. Procedure Code(s):     --- Professional ---                        [phone], Colonoscopy, flexible; with removal of                         tumor(s), polyp(s), or other lesion(s) by snare                         technique                        [id number], 20, Colonoscopy, flexible; with biopsy, single                         or multiple Diagnosis Code(s):     --- Professional ---                        D62, Acute posthemorrhagic anemia                        K92.1, Melena (includes Hematochezia)                        K64.0, First degree hemorrhoids                        K63.5, Polyp of colon CPT copyright 2019 American Medical Association. All rights reserved. The codes documented in this report are preliminary and upon coder review may  be revised to meet current compliance requirements. Efrain Sella MD, MD 12/16/2020 1:19:29 PM This report has been signed electronically. Number of Addenda: 0 Note Initiated On: 12/16/2020 12:12 PM Scope Withdrawal Time: 0 hours 7 minutes 50 seconds  Total Procedure Duration: 0 hours 9 minutes 46 seconds  Estimated Blood Loss:  Estimated blood loss: none.      [facility]

## 2020-12-16 NOTE — Assessment & Plan Note (Signed)
Resolved now.  Continue Flomax.

## 2020-12-16 NOTE — Progress Notes (Signed)
PT Cancellation Note  Patient Details Name: Caleb Fisher MRN: [id number] DOB: 1975/03/08   Cancelled Treatment:    Reason Eval/Treat Not Completed: Patient at procedure or test/unavailable. Pt off floor for EGD and colonoscopy. Will follow up with therapy intervention tomorrow as appropriate.  Herminio Commons, PT, DPT 12:18 PM,12/16/20

## 2020-12-17 ENCOUNTER — Encounter: Payer: Self-pay | Admitting: Internal Medicine

## 2020-12-17 DIAGNOSIS — R778 Other specified abnormalities of plasma proteins: Secondary | ICD-10-CM

## 2020-12-17 LAB — TYPE AND SCREEN
ABO/RH(D): A POS
Antibody Screen: NEGATIVE
Unit division: 0

## 2020-12-17 LAB — CBC
HCT: 27.5 % — ABNORMAL LOW (ref 39.0–52.0)
Hemoglobin: 8.8 g/dL — ABNORMAL LOW (ref 13.0–17.0)
MCH: 28.7 pg (ref 26.0–34.0)
MCHC: 32 g/dL (ref 30.0–36.0)
MCV: 89.6 fL (ref 80.0–100.0)
Platelets: 510 10*3/uL — ABNORMAL HIGH (ref 150–400)
RBC: 3.07 MIL/uL — ABNORMAL LOW (ref 4.22–5.81)
RDW: 14.9 % (ref 11.5–15.5)
WBC: 10 10*3/uL (ref 4.0–10.5)
nRBC: 0 % (ref 0.0–0.2)

## 2020-12-17 LAB — BASIC METABOLIC PANEL
Anion gap: 3 — ABNORMAL LOW (ref 5–15)
BUN: 14 mg/dL (ref 6–20)
CO2: 28 mmol/L (ref 22–32)
Calcium: 7.9 mg/dL — ABNORMAL LOW (ref 8.9–10.3)
Chloride: 104 mmol/L (ref 98–111)
Creatinine, Ser: 0.53 mg/dL — ABNORMAL LOW (ref 0.61–1.24)
GFR, Estimated: >60 mL/min (ref >60)
Glucose, Bld: 98 mg/dL (ref 70–99)
Potassium: 3.5 mmol/L (ref 3.5–5.1)
Sodium: 135 mmol/L (ref 135–145)

## 2020-12-17 LAB — BPAM RBC
Blood Product Expiration Date: 202212212359
ISSUE DATE / TIME: 202212011051
Unit Type and Rh: 6200

## 2020-12-17 LAB — SURGICAL PATHOLOGY

## 2020-12-17 MED ORDER — PANTOPRAZOLE SODIUM 40 MG PO TBEC: 40.0000 mg | DELAYED_RELEASE_TABLET | Freq: Every day | ORAL | 0 refills | Status: DC

## 2020-12-17 MED ORDER — VANCOMYCIN IV (FOR PTA / DISCHARGE USE ONLY): 1000.0000 mg | Freq: Three times a day (TID) | INTRAVENOUS | 0 refills | Status: AC

## 2020-12-17 MED ORDER — HYDROCODONE-ACETAMINOPHEN 5-325 MG PO TABS: 1.0000 | ORAL_TABLET | Freq: Two times a day (BID) | ORAL | 0 refills | Status: AC | PRN

## 2020-12-17 MED ORDER — AMLODIPINE BESYLATE 10 MG PO TABS
10.0000 mg | ORAL_TABLET | Freq: Every day | ORAL | 0 refills | Status: DC
Start: 2020-12-18 — End: 2021-01-20

## 2020-12-17 MED ORDER — ENSURE ENLIVE PO LIQD: 237.0000 mL | Freq: Three times a day (TID) | ORAL | 12 refills | Status: DC

## 2020-12-17 MED ORDER — PANTOPRAZOLE SODIUM 40 MG PO TBEC: 40.0000 mg | DELAYED_RELEASE_TABLET | Freq: Two times a day (BID) | ORAL | 0 refills | Status: DC

## 2020-12-17 MED ORDER — METHOCARBAMOL 500 MG PO TABS
500.0000 mg | ORAL_TABLET | Freq: Two times a day (BID) | ORAL | 0 refills | Status: AC | PRN
Start: 2020-12-17 — End: 2020-12-20

## 2020-12-17 NOTE — Plan of Care (Signed)
  Problem: Health Behavior/Discharge Planning: Goal: Ability to manage health-related needs will improve Outcome: Progressing   Problem: Clinical Measurements: Goal: Ability to maintain clinical measurements within normal limits will improve Outcome: Progressing   Problem: Activity: Goal: Risk for activity intolerance will decrease Outcome: Progressing   Problem: Elimination: Goal: Will not experience complications related to urinary retention Outcome: Progressing   Problem: Pain Managment: Goal: General experience of comfort will improve Outcome: Progressing

## 2020-12-18 NOTE — Discharge Summary (Signed)
Physician Discharge Summary   Patient name: Caleb Fisher  Admit date:     11/30/2020  Discharge date: 12/17/2020  Discharge Physician: Max Sane   PCP: Pcp, No   Recommendations at discharge: f/up with outpt providers as requested  Discharge Diagnoses Principal Problem:   MRSA bacteremia Active Problems:   Abscess in epidural space of cervical spine   Hypertensive urgency   Hypokalemia   Thrombocytopenia (HCC)   Acute metabolic encephalopathy   Severe sepsis ([place])   Acute urinary retention   Abnormal LFTs   Acute hypercapnic respiratory failure ([place])   Fall   Acute respiratory failure with hypoxia ([place])   Epidural abscess   Transaminitis   Iron deficiency anemia due to chronic blood loss   Pulmonary nodules/lesions, multiple   Troponin I above reference range  [place] is a 45 y.o. male with a PMH significant for significant tobacco use. They presented from home to the ED on 11/30/2020 with several days of altered mental status and increased back/neck pain. In the ED, it was found that they had sepsis from unknown source. They were treated with IV fluids and antibiotics.  Neurology was consulted for LP.  Subsequently, patient was found to have MRSA bacteremia.  Imaging revealed cervical epidural abscesses involving C2-C5. He had R 5th digit abscess drained.  Repeat spinal imaging showed more progressive infection including worsening of cervical and involvement of thoracic/lumbar vertebrae.   11/16, and 11/25 neurosurgical procedures for laminectomies. 11/28 had drain removed. Increased mobility and improved pain.  12/14/20 -improved 11/30: TEE negative.  Getting PICC line.  GI consult requested due to anemia 12/1: Getting EGD and colonoscopy for anemia.  Hemoglobin 7.1 getting 1 PRBC transfusion.  MRSA bacteremia Presumed source is excoriation on forearms.  Patient has cervical and lumbar spine with epidural abscess and finger abscess.  Status post  cervical and lumbar laminectomy L2-L3 with drainage of epidural abscess on 11/25.   Repeat blood culture negative from 11/19 and 11/26.  TEE negative PICC line in place on 11/30. he will need 6 weeks of IV antibiotics till 01/20/2021.  Vancomycin 1 g IV every 8 hours per ID.    Pulmonary nodules/lesions, multiple Thought to be septic emboli.   Iron deficiency anemia due to chronic blood loss GI performing EGD and colonoscopy on 12/1 showed non bleeding gastric ulcer, gastritis and duodenitis. Hemorrhoids. Recommended to be on protonix once daily and outpt GI f/up. He was given 1 PRBC transfusion for low Hb. No frank bleed   Acute urinary retention Resolved now.    Severe sepsis ([place]) Due to MRSA bacteremia.  resolved with treatment     Procedures performed: EGD, C-SCOPE and TEE   Condition at discharge: good  Exam Physical Exam   General: awake, alert, NAD, sitting in chair HEENT: atraumatic, pale conjunctiva, anicteric sclera, dry mucus membranes, hearing grossly normal Cardiovascular: quick capillary refill  Nervous: A&O x3. no gross focal neurologic deficits, normal speech Extremities: moves all equally, no edema, normal tone Skin: dry, intact, normal temperature, normal color, excoriations on forearms improved from admission. Psychiatry: Normal mood and affect  Disposition: Home with Home health  Discharge time: greater than 30 minutes.  Follow-up Information     Tsosie Billing, MD. Go on 01/04/2021.   Specialty: Infectious Diseases Why: [facility] Discharge F/UP at 9.45 Am as scheduled Contact information: [address] [place] [address] [phone]         Efrain Sella, MD. Schedule  an appointment as soon as possible for a visit in 2 week(s).   Specialty: Gastroenterology Why: [facility] Discharge F/UP NURSE WILL CALL ONCE AN APPOINTMENT BECOMES AVAILABLE. Contact information: [place]  [address] [phone]                 Allergies as of 12/17/2020   No Known Allergies      Medication List     STOP taking these medications    BC HEADACHE POWDER PO   ibuprofen 800 MG tablet Commonly known as: ADVIL       TAKE these medications    amLODipine 10 MG tablet Commonly known as: NORVASC Take 1 tablet (10 mg total) by mouth daily.   feeding supplement Liqd Take 237 mLs by mouth 3 (three) times daily between meals.   HYDROcodone-acetaminophen 5-325 MG tablet Commonly known as: NORCO/VICODIN Take 1 tablet by mouth every 12 (twelve) hours as needed for up to 3 days for moderate pain or severe pain.   methocarbamol 500 MG tablet Commonly known as: ROBAXIN Take 1-2 tablets (500-1,000 mg total) by mouth every 12 (twelve) hours as needed for up to 3 days for muscle spasms.   pantoprazole 40 MG tablet Commonly known as: Protonix Take 1 tablet (40 mg total) by mouth daily.   vancomycin  IVPB Inject 1,000 mg into the vein every 8 (eight) hours. Indication:  MRSA bacteremia with cervical epidual abscess, lumbar spine epidural abscess, discitis  First Dose: Yes Last Day of Therapy:  01/18/21 Labs weekly every Monday while on IV antibiotics: CBC with differential, CMP, CRP, ESR, Vancomycin trough Labs weekly every Thursday while on IV antibiotics: Vancomycin trough, BMP Please pull PIC at completion of IV antibiotics Fax weekly labs promptly t0 Dr.Ravishankar [phone] Method of administration:Elastomeric Method of administration may be changed at the discretion of the patient and/or caregiver's ability to self-administer the medication ordered. [facility] Care Per Protocol:including placement of biopatch and protocol for clearing blocked lines               Discharge Care Instructions  (From admission, onward)           Start     Ordered   12/17/20 0000  Change dressing on IV access line weekly and PRN  (Home infusion instructions - [facility] )        12/17/20 0958            DG Cervical Spine 2-3 Views  Result Date: 12/01/2020 CLINICAL DATA:  Epidural abscess EXAM: CERVICAL SPINE - 2-3 VIEW; DG C-ARM 1-60 MIN COMPARISON:  MRI cervical spine 11/30/2020 FINDINGS: AP and lateral C-arm images were obtained of the cervical spine. The lateral view, there are surgical instruments posteriorly at C2-3. Instruments are posterior to the spinous processes of C2 and C3. No skeletal abnormality. IMPRESSION: Surgical localization C2-3 posteriorly. Electronically Signed   By: Franchot Gallo M.D.   On: 12/01/2020 18:10   DG Lumbar Spine 2-3 Views  Result Date: 12/10/2020 CLINICAL DATA:  Lumbar laminectomy for epidural abscess EXAM: LUMBAR SPINE - 2-3 VIEW COMPARISON:  Lumbar MRI 12/08/2020 FINDINGS: C-arm images were obtained of the lateral lumbar spine. These reveal localization of the spinal canal at the L3-4 level. IMPRESSION: Instrument overlying the spinal canal at the L3-4 level. Electronically Signed   By: Franchot Gallo M.D.   On: 12/10/2020 15:49   DG Lumbar Spine 2-3 Views  Result Date: 11/30/2020 CLINICAL DATA:  Back pain EXAM:  LUMBAR SPINE - 2-3 VIEW COMPARISON:  None. FINDINGS: Normal alignment. The vertebral body heights are well preserved. Multilevel ventral endplate spurring and disc space narrowing is identified. This is most severe at the L5-S1 level. Mild facet degenerative change identified at L3-4, L4-5 and L5-S1. IMPRESSION: 1. No acute findings. 2. Degenerative disc disease and facet arthropathy. Electronically Signed   By: Kerby Moors M.D.   On: 11/30/2020 14:24   CT Head Wo Contrast  Result Date: 11/30/2020 CLINICAL DATA:  Altered mental status EXAM: CT HEAD WITHOUT CONTRAST TECHNIQUE: Contiguous axial images were obtained from the base of the skull through the vertex without intravenous contrast. COMPARISON:  None. FINDINGS: Brain: Ventricles are not dilated. There is no shift of midline structures.  Cortical sulci are prominent. There is 2 x 1 cm low-density in the left parieto-occipital cortex immediately above the tentorium, possibly suggesting encephalomalacia from previous infarction. There is no focal edema or mass effect. There are no signs of bleeding within the cranium. Vascular: There are scattered arterial calcifications. Skull: Unremarkable. Sinuses/Orbits: Unremarkable. Other: None IMPRESSION: There are no signs of bleeding within the cranium. Ventricles are not dilated. There is no focal mass effect. There is 2 x 1 cm area of low-density in the left parieto-occipital cortex, possibly suggesting encephalomalacia from previous infarction. Follow-up MRI should be considered for further characterization. Electronically Signed   By: Elmer Picker M.D.   On: 11/30/2020 13:51   CT Cervical Spine Wo Contrast  Result Date: 11/30/2020 CLINICAL DATA:  Intermittent, severe neck pain, frequent falls, fall 3 days ago EXAM: CT CERVICAL SPINE WITHOUT CONTRAST TECHNIQUE: Multidetector CT imaging of the cervical spine was performed without intravenous contrast. Multiplanar CT image reconstructions were also generated. COMPARISON:  None. FINDINGS: Alignment: Normal. Skull base and vertebrae: No acute fracture. No primary bone lesion or focal pathologic process. Soft tissues and spinal canal: No prevertebral fluid or swelling. No visible canal hematoma. Disc levels: Focally moderate disc space height loss and osteophytosis at C5-C6, with otherwise preserved disc spaces. Small, broad-based posterior disc bulge at C5-C6 (series 6, image 27, series 3, image 61). Upper chest: Paraseptal emphysema. Partially cavitary nodule of the included left pulmonary apex measuring 0.7 x 0.7 cm (series 2, image 97). Other: None. IMPRESSION: 1. No fracture or static subluxation of the cervical spine. 2. Focally moderate disc space height loss and osteophytosis at C5-C6, with otherwise preserved disc spaces. Small, broad-based  posterior disc bulge at C5-C6. Cervical disc, neural foraminal, and spinal cord pathology may be further evaluated by MRI if indicated by neurologically localizing signs and symptoms. 3. Partially cavitary nodule of the included left pulmonary apex measuring 0.7 x 0.7 cm. This is generally nonspecific and statistically likely to be infectious or inflammatory, however general differential considerations would include septic embolus and metastatic disease. Consider additional dedicated imaging of the chest at this time if warranted by acute clinical presentation. Otherwise recommend follow-up CT examination of the chest on a nonemergent basis to evaluate for this and other pulmonary nodules. Emphysema (ICD10-J43.9). Electronically Signed   By: Delanna Ahmadi M.D.   On: 11/30/2020 14:27   MR BRAIN W WO CONTRAST  Addendum Date: 11/30/2020   ADDENDUM REPORT: 11/30/2020 22:08 ADDENDUM: These results were called by telephone at the time of interpretation on 11/30/2020 at 10:08 pm to provider [facility] , who verbally acknowledged these results. Electronically Signed   By: Ulyses Jarred M.D.   On: 11/30/2020 22:08   Result Date: 11/30/2020 CLINICAL DATA:  Neck  pain and altered mental status EXAM: MRI HEAD WITHOUT AND WITH CONTRAST MRI CERVICAL SPINE WITHOUT AND WITH CONTRAST TECHNIQUE: Multiplanar, multiecho pulse sequences of the brain and surrounding structures, and cervical spine, to include the craniocervical junction and cervicothoracic junction, were obtained without and with intravenous contrast. CONTRAST:  7 mL Gadavist COMPARISON:  None. FINDINGS: MRI HEAD FINDINGS Brain: No acute infarct, mass effect or extra-axial collection. No acute or chronic hemorrhage. There is multifocal hyperintense T2-weighted signal within the white matter. Parenchymal volume and CSF spaces are normal. 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 The midline structures are normal. Vascular: Major flow voids are preserved. Skull  and upper cervical spine: Normal calvarium and skull base. Visualized upper cervical spine and soft tissues are normal. Sinuses/Orbits:No paranasal sinus fluid levels or advanced mucosal thickening. No mastoid or middle ear effusion. Normal orbits. MRI CERVICAL SPINE FINDINGS Alignment: Physiologic. Vertebrae: No fracture, evidence of discitis, or bone lesion. Cord: Spinal cord itself is normal, but there is a circumferential collection the length the cervical spine with peripheral contrast enhancement. The thickness of the collection is greatest at the C2 and C3 levels, measuring up to 6 mm in the 7 o'clock position of the spinal canal. Posterior Fossa, vertebral arteries, paraspinal tissues: Small prevertebral effusion from C2-C5, measuring 7 mm in thickness. Disc levels: There is mild multilevel degenerative disc disease, greatest at C5-6. There is no bony spinal canal stenosis. The CSF space is effaced along the entire length of the cervical spine due to the above described epidural collection. IMPRESSION: 1. 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 2. No spinal cord signal change. 3. Small prevertebral effusion from C2-C5. 4. No acute intracranial abnormality. 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 Electronically Signed: By: Ulyses Jarred M.D. On: 11/30/2020 21:51   MR CERVICAL SPINE W WO CONTRAST  Result Date: 12/08/2020 CLINICAL DATA:  Known cervical epidural abscess, mid and low back pain, suspect worsening infection EXAM: MRI CERVICAL, THORACIC AND LUMBAR SPINE WITHOUT AND WITH CONTRAST TECHNIQUE: Multiplanar and multiecho pulse sequences of the cervical spine, to include the craniocervical junction and cervicothoracic junction, and thoracic and lumbar spine, were obtained without and with intravenous contrast. CONTRAST:  80m  GADAVIST GADOBUTROL 1 MMOL/ML IV SOLN COMPARISON:  11/30/2020 MRI cervical spine, correlation is also made with 11/30/2020 CT cervical, thoracic, and lumbar spine. FINDINGS: MRI CERVICAL SPINE FINDINGS Alignment: Straightening the normal cervical lordosis. No significant listhesis Vertebrae: Increased T2 signal and enhancement in the C4 vertebral body, which is new from the prior exam, with similar signal at the superior posterior aspect of C5 (series 7, image 10 and series 42, image 9). The endplates appear intact. No abnormal signal in the C4-C5 disc space. Cord: Spinal cord is normal in signal and morphology. No abnormal spinal cord enhancement. Posterior to the spinal cord, there is a circumferential collection the length of the cervical spine, with peripheral contrast enhancement, which measures up to 4 mm at the level of C5 (series 5, image 8), previously 3 mm, and causes moderate thecal sac narrowing. In addition it now measures up to 5 mm at the level C7-T1 (series 5, image 8), previously 3 mm, but without significant thecal sac narrowing. The previously noted greatest dimension the collection at the C2-C3 level now measures 3 mm (series 42, image 8), previously 5 mm when measured  similarly. Posterior Fossa, vertebral arteries, paraspinal tissues: Redemonstrated prevertebral effusion, now measuring up to 10 mm at the anterior aspect of C4 (series 7, image 7), previously 7 mm. Status post interval right hemilaminectomy C2-C4 for drainage of the epidural abscess, with a fluid collection in the right paraspinal tissues, measuring up to 1.9 x 2.3 x 5.1 cm in the paraspinous musculature (series 8, image 5 and series 5, image 5), with a connection to a smaller more superficial collection that measures 1.5 x 3.2 x 4.1 cm (series 8, image 1 and series 5, image 8) these collections demonstrate peripheral enhancement but do not enhance internally. Additional surrounding increased T2 signal and enhancement, likely  edema. Disc levels: Mild degenerative changes without osseous spinal canal stenosis. Disc bulge at C5-C6, which in conjunction with the epidural collection, causes moderate thecal sac narrowing. MRI THORACIC SPINE FINDINGS Alignment:  Physiologic. Vertebrae: No acute fracture or suspicious osseous lesion. No abnormal enhancement. T1 and T2 hyperintense lesions, likely benign hemangiomas, most prominent in T1 and T6. Cord: Normal signal and morphology. No abnormal spinal cord enhancement. Circumferential enhancing collection extends from the cervicothoracic junction to approximately the level of T6-T7 (series 41, image 11). This does not cause any significant thecal sac narrowing in the thoracic spine. Paraspinal and other soft tissues: Negative. Disc levels: No significant spinal canal stenosis or neural foraminal narrowing. MRI LUMBAR SPINE FINDINGS Segmentation:  Standard. Alignment:  Physiologic. Vertebrae: Increased T2 signal and diffuse enhancement in the L5 vertebral body and at the superior aspect of S1, with fluid in and enhancement in the disc space (series 33, image 10 and series 38, image 10). Less diffuse increased T2 signal and contrast enhancement in the posterior aspect of L4 (series 38, image 9). Similar abnormal signal is also seen in the spinous processes of L4 and L5. Additional T1 and T2 hyperintense lesions, consistent with benign hemangiomas. Conus medullaris and cauda equina: Conus extends to the L1 level. Conus and cauda equina appear normal. Epidural collection along the dorsal aspect of the thecal sac beginning at the level of L1 and extending through the sacrum. The collection measures up to 11 mm at the level of L2-L3 (series 38, image 10), where it causes severe thecal sac narrowing. The collection is more circumferential from L3-L4 through the sacrum. Paraspinal and other soft tissues: Loculated fluid collection with peripheral enhancement in the right paraspinous musculature (series  36, image 31, series 39, image 31, series 33, image 7 and series 38, image 7), extending from the superior aspect of L3 to the superior aspect of S1 and measuring up to 2.6 x 2.0 x 8.0 cm (series 36, image 30 and series 33, image 7), with additional enhancement and increased T2 signal, likely edema, extending from L1-L2 to the inferior edge of the field-of-view. Suspect a small subcentimeter psoas abscess on the right at L1 (series 36, image 11), with additional enhancement and increased T2 signal in the medial aspect of the psoas bilaterally, without focal collection. Disc levels: T12-L1: No significant disc bulge. No spinal canal stenosis or neural foraminal narrowing. L1-L2: Dorsal epidural collection measuring up to 8 mm. Mild to moderate thecal sac narrowing. No significant disc bulge. No neural foraminal narrowing. L2-L3: Dorsal epidural collection measuring up to 11 mm, causing severe thecal sac narrowing. No significant disc bulge. No significant neural foraminal narrowing. L3-L4: Dorsal epidural collection measuring to 7 mm. Moderate facet arthropathy. Moderate to severe thecal sac narrowing. No osseous neural foraminal narrowing. L4-L5: More circumferential epidural collection,  which causes moderate to severe thecal sac narrowing. Moderate facet arthropathy. Minimal disc bulge. No significant neural foraminal narrowing. L5-S1: Severe disc height loss and left greater than right disc bulge, with left paracentral annular fissure. Circumferential epidural collection, which causes moderate to severe thecal sac narrowing. Mild facet arthropathy. Mild bilateral neural foraminal narrowing. IMPRESSION: CERVICAL SPINE FINDINGS 1. 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 The collection is decreased in size at the C2-C3 level, but increased in size at C5 and C7, with moderate thecal sac narrowing at C5 from the  combination of the collection and a small disc bulge. 2. Status post C2-C4 right hemilaminectomy and decompression, with a fluid collection in the paraspinous musculature and superficial soft tissues, as well as surrounding edema, favored to be a postoperative seroma. 3. Increased T2 signal and enhancement in the C4 vertebral body and superior posterior aspect of C5, which are new from the prior exam. No abnormal disc signal, however this is concerning for developing osteomyelitis. Attention on follow-up. 4. Increased prevertebral edema. 5. No abnormal spinal cord signal. THORACIC SPINE FINDINGS 1. 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 No significant thecal sac narrowing in the thoracic spine. 2. No abnormal vertebral body enhancement or abnormal spinal cord signal. LUMBAR SPINE FINDINGs 1. 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 2. Increased T2 signal and enhancement in L5 and at the superior aspect of S1, with fluid and enhancement in the disc space, concerning for osteomyelitis discitis. Additional abnormal signal in the posterior aspect of L4 may represent early osteomyelitis. Attention on follow-up. 3. Loculated fluid collection with peripheral enhancement in the right paraspinous musculature, most likely abscesses, extending from the level of L3 to S1, with additional surrounding edema. Possible additional subcentimeter psoas abscess on the right. 4. No abnormal spinal cord signal or cauda equina enhancement in the lumbar spine. Electronically Signed   By: Merilyn Baba M.D.   On: 12/08/2020 23:36   MR CERVICAL SPINE W WO CONTRAST  Addendum Date: 11/30/2020   ADDENDUM REPORT: 11/30/2020 22:08 ADDENDUM: These results were called by telephone at the  time of interpretation on 11/30/2020 at 10:08 pm to provider [facility] , who verbally acknowledged these results. Electronically Signed   By: Ulyses Jarred M.D.   On: 11/30/2020 22:08   Result Date: 11/30/2020 CLINICAL DATA:  Neck pain and altered mental status EXAM: MRI HEAD WITHOUT AND WITH CONTRAST MRI CERVICAL SPINE WITHOUT AND WITH CONTRAST TECHNIQUE: Multiplanar, multiecho pulse sequences of the brain and surrounding structures, and cervical spine, to include the craniocervical junction and cervicothoracic junction, were obtained without and with intravenous contrast. CONTRAST:  7 mL Gadavist COMPARISON:  None. FINDINGS: MRI HEAD FINDINGS Brain: No acute infarct, mass effect or extra-axial collection. No acute or chronic hemorrhage. There is multifocal hyperintense T2-weighted signal within the white matter. Parenchymal volume and CSF spaces are normal. 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 The midline structures are normal. Vascular: Major flow voids are preserved. Skull and upper cervical spine: Normal calvarium and skull base. Visualized upper cervical spine and soft tissues are normal. Sinuses/Orbits:No paranasal sinus fluid levels or advanced mucosal thickening. No mastoid or middle ear effusion.  Normal orbits. MRI CERVICAL SPINE FINDINGS Alignment: Physiologic. Vertebrae: No fracture, evidence of discitis, or bone lesion. Cord: Spinal cord itself is normal, but there is a circumferential collection the length the cervical spine with peripheral contrast enhancement. The thickness of the collection is greatest at the C2 and C3 levels, measuring up to 6 mm in the 7 o'clock position of the spinal canal. Posterior Fossa, vertebral arteries, paraspinal tissues: Small prevertebral effusion from C2-C5, measuring 7 mm in thickness. Disc levels: There is mild multilevel degenerative disc disease, greatest at C5-6. There is no bony spinal canal stenosis. The CSF space is effaced along the entire length  of the cervical spine due to the above described epidural collection. IMPRESSION: 1. 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 2. No spinal cord signal change. 3. Small prevertebral effusion from C2-C5. 4. No acute intracranial abnormality. 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 Electronically Signed: By: Ulyses Jarred M.D. On: 11/30/2020 21:51   MR THORACIC SPINE W WO CONTRAST  Result Date: 12/08/2020 CLINICAL DATA:  Known cervical epidural abscess, mid and low back pain, suspect worsening infection EXAM: MRI CERVICAL, THORACIC AND LUMBAR SPINE WITHOUT AND WITH CONTRAST TECHNIQUE: Multiplanar and multiecho pulse sequences of the cervical spine, to include the craniocervical junction and cervicothoracic junction, and thoracic and lumbar spine, were obtained without and with intravenous contrast. CONTRAST:  76m GADAVIST GADOBUTROL 1 MMOL/ML IV SOLN COMPARISON:  11/30/2020 MRI cervical spine, correlation is also made with 11/30/2020 CT cervical, thoracic, and lumbar spine. FINDINGS: MRI CERVICAL SPINE FINDINGS Alignment: Straightening the normal cervical lordosis. No significant listhesis Vertebrae: Increased T2 signal and enhancement in the C4 vertebral body, which is new from the prior exam, with similar signal at the superior posterior aspect of C5 (series 7, image 10 and series 42, image 9). The endplates appear intact. No abnormal signal in the C4-C5 disc space. Cord: Spinal cord is normal in signal and morphology. No abnormal spinal cord enhancement. Posterior to the spinal cord, there is a circumferential collection the length of the cervical spine, with peripheral contrast enhancement, which measures up to 4 mm at the level of C5 (series 5, image 8), previously 3 mm, and causes moderate thecal sac narrowing. In addition it now measures  up to 5 mm at the level C7-T1 (series 5, image 8), previously 3 mm, but without significant thecal sac narrowing. The previously noted greatest dimension the collection at the C2-C3 level now measures 3 mm (series 42, image 8), previously 5 mm when measured similarly. Posterior Fossa, vertebral arteries, paraspinal tissues: Redemonstrated prevertebral effusion, now measuring up to 10 mm at the anterior aspect of C4 (series 7, image 7), previously 7 mm. Status post interval right hemilaminectomy C2-C4 for drainage of the epidural abscess, with a fluid collection in the right paraspinal tissues, measuring up to 1.9 x 2.3 x 5.1 cm in the paraspinous musculature (series 8, image 5 and series 5, image 5), with a connection to a smaller more superficial collection that measures 1.5 x 3.2 x 4.1 cm (series 8, image 1 and series 5, image 8) these collections demonstrate peripheral enhancement but do not enhance internally. Additional surrounding increased T2 signal and enhancement, likely edema. Disc levels: Mild degenerative changes without osseous spinal canal stenosis. Disc bulge at C5-C6, which in conjunction with the epidural collection, causes moderate thecal sac narrowing. MRI  THORACIC SPINE FINDINGS Alignment:  Physiologic. Vertebrae: No acute fracture or suspicious osseous lesion. No abnormal enhancement. T1 and T2 hyperintense lesions, likely benign hemangiomas, most prominent in T1 and T6. Cord: Normal signal and morphology. No abnormal spinal cord enhancement. Circumferential enhancing collection extends from the cervicothoracic junction to approximately the level of T6-T7 (series 41, image 11). This does not cause any significant thecal sac narrowing in the thoracic spine. Paraspinal and other soft tissues: Negative. Disc levels: No significant spinal canal stenosis or neural foraminal narrowing. MRI LUMBAR SPINE FINDINGS Segmentation:  Standard. Alignment:  Physiologic. Vertebrae: Increased T2 signal and  diffuse enhancement in the L5 vertebral body and at the superior aspect of S1, with fluid in and enhancement in the disc space (series 33, image 10 and series 38, image 10). Less diffuse increased T2 signal and contrast enhancement in the posterior aspect of L4 (series 38, image 9). Similar abnormal signal is also seen in the spinous processes of L4 and L5. Additional T1 and T2 hyperintense lesions, consistent with benign hemangiomas. Conus medullaris and cauda equina: Conus extends to the L1 level. Conus and cauda equina appear normal. Epidural collection along the dorsal aspect of the thecal sac beginning at the level of L1 and extending through the sacrum. The collection measures up to 11 mm at the level of L2-L3 (series 38, image 10), where it causes severe thecal sac narrowing. The collection is more circumferential from L3-L4 through the sacrum. Paraspinal and other soft tissues: Loculated fluid collection with peripheral enhancement in the right paraspinous musculature (series 36, image 31, series 39, image 31, series 33, image 7 and series 38, image 7), extending from the superior aspect of L3 to the superior aspect of S1 and measuring up to 2.6 x 2.0 x 8.0 cm (series 36, image 30 and series 33, image 7), with additional enhancement and increased T2 signal, likely edema, extending from L1-L2 to the inferior edge of the field-of-view. Suspect a small subcentimeter psoas abscess on the right at L1 (series 36, image 11), with additional enhancement and increased T2 signal in the medial aspect of the psoas bilaterally, without focal collection. Disc levels: T12-L1: No significant disc bulge. No spinal canal stenosis or neural foraminal narrowing. L1-L2: Dorsal epidural collection measuring up to 8 mm. Mild to moderate thecal sac narrowing. No significant disc bulge. No neural foraminal narrowing. L2-L3: Dorsal epidural collection measuring up to 11 mm, causing severe thecal sac narrowing. No significant disc  bulge. No significant neural foraminal narrowing. L3-L4: Dorsal epidural collection measuring to 7 mm. Moderate facet arthropathy. Moderate to severe thecal sac narrowing. No osseous neural foraminal narrowing. L4-L5: More circumferential epidural collection, which causes moderate to severe thecal sac narrowing. Moderate facet arthropathy. Minimal disc bulge. No significant neural foraminal narrowing. L5-S1: Severe disc height loss and left greater than right disc bulge, with left paracentral annular fissure. Circumferential epidural collection, which causes moderate to severe thecal sac narrowing. Mild facet arthropathy. Mild bilateral neural foraminal narrowing. IMPRESSION: CERVICAL SPINE FINDINGS 1. 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 The collection is decreased in size at the C2-C3 level, but increased in size at C5 and C7, with moderate thecal sac narrowing at C5 from the combination of the collection and a small disc bulge. 2. Status post C2-C4 right hemilaminectomy and decompression, with a fluid collection in the paraspinous musculature and superficial soft tissues, as well as surrounding edema, favored to be  a postoperative seroma. 3. Increased T2 signal and enhancement in the C4 vertebral body and superior posterior aspect of C5, which are new from the prior exam. No abnormal disc signal, however this is concerning for developing osteomyelitis. Attention on follow-up. 4. Increased prevertebral edema. 5. No abnormal spinal cord signal. THORACIC SPINE FINDINGS 1. 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 No significant thecal sac narrowing in the thoracic spine. 2. No abnormal vertebral body enhancement or abnormal spinal cord signal. LUMBAR SPINE FINDINGs 1. Epidural abscess in the lumbar spine from the level of L1 through the sacrum, which is  predominately dorsal from L1 through L3-L4, then more circumferential, causing up to severe thecal sac narrowing at L2-L3, where it measures up to 11 mm. 2. Increased T2 signal and enhancement in L5 and at the superior aspect of S1, with fluid and enhancement in the disc space, concerning for osteomyelitis discitis. Additional abnormal signal in the posterior aspect of L4 may represent early osteomyelitis. Attention on follow-up. 3. Loculated fluid collection with peripheral enhancement in the right paraspinous musculature, most likely abscesses, extending from the level of L3 to S1, with additional surrounding edema. Possible additional subcentimeter psoas abscess on the right. 4. No abnormal spinal cord signal or cauda equina enhancement in the lumbar spine. Electronically Signed   By: Merilyn Baba M.D.   On: 12/08/2020 23:36   MR Lumbar Spine W Wo Contrast  Result Date: 12/08/2020 CLINICAL DATA:  Known cervical epidural abscess, mid and low back pain, suspect worsening infection EXAM: MRI CERVICAL, THORACIC AND LUMBAR SPINE WITHOUT AND WITH CONTRAST TECHNIQUE: Multiplanar and multiecho pulse sequences of the cervical spine, to include the craniocervical junction and cervicothoracic junction, and thoracic and lumbar spine, were obtained without and with intravenous contrast. CONTRAST:  51m GADAVIST GADOBUTROL 1 MMOL/ML IV SOLN COMPARISON:  11/30/2020 MRI cervical spine, correlation is also made with 11/30/2020 CT cervical, thoracic, and lumbar spine. FINDINGS: MRI CERVICAL SPINE FINDINGS Alignment: Straightening the normal cervical lordosis. No significant listhesis Vertebrae: Increased T2 signal and enhancement in the C4 vertebral body, which is new from the prior exam, with similar signal at the superior posterior aspect of C5 (series 7, image 10 and series 42, image 9). The endplates appear intact. No abnormal signal in the C4-C5 disc space. Cord: Spinal cord is normal in signal and morphology. No  abnormal spinal cord enhancement. Posterior to the spinal cord, there is a circumferential collection the length of the cervical spine, with peripheral contrast enhancement, which measures up to 4 mm at the level of C5 (series 5, image 8), previously 3 mm, and causes moderate thecal sac narrowing. In addition it now measures up to 5 mm at the level C7-T1 (series 5, image 8), previously 3 mm, but without significant thecal sac narrowing. The previously noted greatest dimension the collection at the C2-C3 level now measures 3 mm (series 42, image 8), previously 5 mm when measured similarly. Posterior Fossa, vertebral arteries, paraspinal tissues: Redemonstrated prevertebral effusion, now measuring up to 10 mm at the anterior aspect of C4 (series 7, image 7), previously 7 mm. Status post interval right hemilaminectomy C2-C4 for drainage of the epidural abscess, with a fluid collection in the right paraspinal tissues, measuring up to 1.9 x 2.3 x 5.1 cm in the paraspinous musculature (series 8, image 5 and series 5, image 5), with a connection to a smaller more superficial collection that measures 1.5 x 3.2 x 4.1 cm (series  8, image 1 and series 5, image 8) these collections demonstrate peripheral enhancement but do not enhance internally. Additional surrounding increased T2 signal and enhancement, likely edema. Disc levels: Mild degenerative changes without osseous spinal canal stenosis. Disc bulge at C5-C6, which in conjunction with the epidural collection, causes moderate thecal sac narrowing. MRI THORACIC SPINE FINDINGS Alignment:  Physiologic. Vertebrae: No acute fracture or suspicious osseous lesion. No abnormal enhancement. T1 and T2 hyperintense lesions, likely benign hemangiomas, most prominent in T1 and T6. Cord: Normal signal and morphology. No abnormal spinal cord enhancement. Circumferential enhancing collection extends from the cervicothoracic junction to approximately the level of T6-T7 (series 41, image  11). This does not cause any significant thecal sac narrowing in the thoracic spine. Paraspinal and other soft tissues: Negative. Disc levels: No significant spinal canal stenosis or neural foraminal narrowing. MRI LUMBAR SPINE FINDINGS Segmentation:  Standard. Alignment:  Physiologic. Vertebrae: Increased T2 signal and diffuse enhancement in the L5 vertebral body and at the superior aspect of S1, with fluid in and enhancement in the disc space (series 33, image 10 and series 38, image 10). Less diffuse increased T2 signal and contrast enhancement in the posterior aspect of L4 (series 38, image 9). Similar abnormal signal is also seen in the spinous processes of L4 and L5. Additional T1 and T2 hyperintense lesions, consistent with benign hemangiomas. Conus medullaris and cauda equina: Conus extends to the L1 level. Conus and cauda equina appear normal. Epidural collection along the dorsal aspect of the thecal sac beginning at the level of L1 and extending through the sacrum. The collection measures up to 11 mm at the level of L2-L3 (series 38, image 10), where it causes severe thecal sac narrowing. The collection is more circumferential from L3-L4 through the sacrum. Paraspinal and other soft tissues: Loculated fluid collection with peripheral enhancement in the right paraspinous musculature (series 36, image 31, series 39, image 31, series 33, image 7 and series 38, image 7), extending from the superior aspect of L3 to the superior aspect of S1 and measuring up to 2.6 x 2.0 x 8.0 cm (series 36, image 30 and series 33, image 7), with additional enhancement and increased T2 signal, likely edema, extending from L1-L2 to the inferior edge of the field-of-view. Suspect a small subcentimeter psoas abscess on the right at L1 (series 36, image 11), with additional enhancement and increased T2 signal in the medial aspect of the psoas bilaterally, without focal collection. Disc levels: T12-L1: No significant disc bulge. No  spinal canal stenosis or neural foraminal narrowing. L1-L2: Dorsal epidural collection measuring up to 8 mm. Mild to moderate thecal sac narrowing. No significant disc bulge. No neural foraminal narrowing. L2-L3: Dorsal epidural collection measuring up to 11 mm, causing severe thecal sac narrowing. No significant disc bulge. No significant neural foraminal narrowing. L3-L4: Dorsal epidural collection measuring to 7 mm. Moderate facet arthropathy. Moderate to severe thecal sac narrowing. No osseous neural foraminal narrowing. L4-L5: More circumferential epidural collection, which causes moderate to severe thecal sac narrowing. Moderate facet arthropathy. Minimal disc bulge. No significant neural foraminal narrowing. L5-S1: Severe disc height loss and left greater than right disc bulge, with left paracentral annular fissure. Circumferential epidural collection, which causes moderate to severe thecal sac narrowing. Mild facet arthropathy. Mild bilateral neural foraminal narrowing. IMPRESSION: CERVICAL SPINE FINDINGS 1. 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 The collection is decreased in size at the C2-C3 level,  but increased in size at C5 and C7, with moderate thecal sac narrowing at C5 from the combination of the collection and a small disc bulge. 2. Status post C2-C4 right hemilaminectomy and decompression, with a fluid collection in the paraspinous musculature and superficial soft tissues, as well as surrounding edema, favored to be a postoperative seroma. 3. Increased T2 signal and enhancement in the C4 vertebral body and superior posterior aspect of C5, which are new from the prior exam. No abnormal disc signal, however this is concerning for developing osteomyelitis. Attention on follow-up. 4. Increased prevertebral edema. 5. No abnormal spinal cord signal. THORACIC SPINE FINDINGS 1. Epidural abscess extends  from the cervical spine into the thoracic spine to the level of T6-T7. No significant thecal sac narrowing in the thoracic spine. 2. No abnormal vertebral body enhancement or abnormal spinal cord signal. LUMBAR SPINE FINDINGs 1. 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 2. Increased T2 signal and enhancement in L5 and at the superior aspect of S1, with fluid and enhancement in the disc space, concerning for osteomyelitis discitis. Additional abnormal signal in the posterior aspect of L4 may represent early osteomyelitis. Attention on follow-up. 3. Loculated fluid collection with peripheral enhancement in the right paraspinous musculature, most likely abscesses, extending from the level of L3 to S1, with additional surrounding edema. Possible additional subcentimeter psoas abscess on the right. 4. No abnormal spinal cord signal or cauda equina enhancement in the lumbar spine. Electronically Signed   By: Merilyn Baba M.D.   On: 12/08/2020 23:36   CT CHEST ABDOMEN PELVIS W CONTRAST  Result Date: 11/30/2020 CLINICAL DATA:  Multiple falls. EXAM: CT CHEST, ABDOMEN, AND PELVIS WITH CONTRAST TECHNIQUE: Multidetector CT imaging of the chest, abdomen and pelvis was performed following the standard protocol during bolus administration of intravenous contrast. CONTRAST:  112m OMNIPAQUE IOHEXOL 300 MG/ML  SOLN COMPARISON:  None. FINDINGS: CT CHEST FINDINGS Cardiovascular: There is mild calcification of the aortic arch. The ascending thoracic aorta measures 4.4 cm x 4.3 cm. Normal heart size. No pericardial effusion. Mediastinum/Nodes: A 2.6 cm x 2.6 cm anterolateral right paratracheal lymph node is seen. Mild right hilar lymphadenopathy is also noted. Thyroid gland, trachea, and esophagus demonstrate no significant findings.  Lungs/Pleura: Subcentimeter noncalcified lung nodules versus focal scars are seen along the posterolateral aspect of the left apex (axial CT images 7 through 11, CT series 4). A 9 mm noncalcified lung nodule is seen within the lateral aspect of the left lower lobe (axial CT image 34, CT series 4). A 1.1 cm noncalcified lung nodule is seen within the posteromedial aspect of the right lung base (axial CT image 43, CT series 4). An 8 mm noncalcified lung nodule is seen within the lateral aspect of the right lung base (axial CT image 38, CT series 4). A 5 mm anterior left basilar noncalcified lung nodule is present (axial CT image 38, CT series 4). There is no evidence of an acute infiltrate, pleural effusion or pneumothorax. Musculoskeletal: No chest wall mass or suspicious bone lesions identified. CT ABDOMEN PELVIS FINDINGS Hepatobiliary: No focal liver abnormality is seen. No gallstones, gallbladder wall thickening, or biliary dilatation. Pancreas: Unremarkable. No pancreatic ductal dilatation or surrounding inflammatory changes. Spleen: Normal in size without focal abnormality. Adrenals/Urinary Tract: Adrenal glands are unremarkable. Kidneys are normal  in size, without obstructing renal calculi or focal lesion. A 1.2 cm x 1.1 cm nonobstructing renal calculus is seen within the mid right kidney. Mild right-sided hydronephrosis is seen. No ureteral calculi are identified. Mild Peri ureteral inflammatory fat stranding is seen. This extends along the anterior aspect of the right psoas muscle. Bladder is unremarkable. Stomach/Bowel: Stomach is within normal limits. Appendix appears normal. No evidence of bowel wall thickening, distention, or inflammatory changes. Vascular/Lymphatic: Aortic atherosclerosis. No enlarged abdominal or pelvic lymph nodes. Reproductive: Prostate is unremarkable. Other: No abdominal wall hernia or abnormality. No abdominopelvic ascites. Musculoskeletal: No acute osseous abnormalities are  identified. Marked severity degenerative changes are seen at the level of L5-S1. IMPRESSION: 1. Mild right-sided periureteral inflammatory fat stranding which may represent sequelae associated with acute pyelonephritis versus recently passed renal calculus. Correlation with urinalysis is recommended. 2. Multiple noncalcified lung nodules within both lungs, as described above. Non-contrast chest CT at 3-6 months is recommended. If the nodules are stable at time of repeat CT, then future CT at 18-24 months (from today's scan) is considered optional for low-risk patients, but is recommended for high-risk patients. This recommendation follows the consensus statement: Guidelines for Management of Incidental Pulmonary Nodules Detected on CT Images: From the Fleischner Society 2017; Radiology 2017; 284:228-243. 3. Ascending thoracic aortic aneurysm, measuring 4.4 cm x 4.3 cm. 4. 1.2 cm x 1.1 cm nonobstructing renal calculus within the mid right kidney. 5. Marked severity degenerative changes at the level of L5-S1. 6. Aortic atherosclerosis. Aortic Atherosclerosis (ICD10-I70.0). Electronically Signed   By: Virgina Norfolk M.D.   On: 11/30/2020 16:25   CT T-SPINE NO CHARGE  Result Date: 11/30/2020 CLINICAL DATA:  Altered mental status.  Back pain. EXAM: CT THORACIC SPINE WITHOUT CONTRAST TECHNIQUE: Multidetector CT images of the thoracic were obtained using the standard protocol without intravenous contrast. COMPARISON:  CT chest 11/30/2020 FINDINGS: Alignment: Normal Vertebrae: No thoracic region fracture or focal bone lesion. Paraspinal and other soft tissues: Negative Disc levels: No significant thoracic region degenerative change. No apparent stenosis of the canal or foramina. No significant facet arthritis. IMPRESSION: No acute or traumatic finding. No significant abnormality in the thoracic region. Electronically Signed   By: Nelson Chimes M.D.   On: 11/30/2020 16:14   CT L-SPINE NO CHARGE  Result Date:  11/30/2020 CLINICAL DATA:  Neck and back pain.  Lethargic.  Multiple falls. EXAM: CT LUMBAR SPINE WITHOUT CONTRAST TECHNIQUE: Multidetector CT imaging of the lumbar spine was performed without intravenous contrast administration. Multiplanar CT image reconstructions were also generated. COMPARISON:  Radiography 11/30/2020 FINDINGS: Segmentation: 5 lumbar type vertebral bodies. Alignment: No malalignment. Vertebrae: No evidence of fracture. Chronic discogenic sclerotic change of the endplates at Z3-G9. Paraspinal and other soft tissues: Negative Disc levels: No significant disc level finding at L3-4 or above. L4-5: Circumferential protrusion of the disc. Facet and ligamentous hypertrophy. Multifactorial stenosis at this level that could cause neural compression. L5-S1: Chronic disc degeneration with sclerotic bone changes as noted above. Shallow protrusion of the disc. Stenosis of the subarticular lateral recesses that could cause neural compression. IMPRESSION: No acute lumbar region finding. 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 Electronically Signed   By: Nelson Chimes M.D.   On: 11/30/2020 16:13   DG Chest Port 1 View  Result Date: 12/02/2020 CLINICAL DATA:  Acute respiratory failure with hypoxia. EXAM: PORTABLE CHEST 1 VIEW COMPARISON:  December 01, 2020. FINDINGS: Stable cardiomediastinal silhouette. Endotracheal  tube is in good position. Stable mild interstitial densities are noted which may represent minimal pulmonary edema or possibly atypical inflammation, or possibly scarring. Bony thorax is unremarkable. IMPRESSION: Stable mild interstitial densities are noted bilaterally as described above. Stable support apparatus. Electronically Signed   By: Marijo Conception M.D.   On: 12/02/2020 08:11   DG Chest Port 1 View  Result Date: 12/01/2020 CLINICAL DATA:  Status post intubation. EXAM: PORTABLE CHEST 1 VIEW COMPARISON:  Chest  radiograph dated 12/01/2020. FINDINGS: Endotracheal tube with tip approximately 5 cm above the carina. Bilateral interstitial prominence, left greater right. No focal consolidation, pleural effusion, pneumothorax. The cardiac silhouette is within normal limits. No acute osseous pathology. IMPRESSION: 1. Endotracheal tube above the carina. 2. Bilateral interstitial prominence. No focal consolidation. Electronically Signed   By: Anner Crete M.D.   On: 12/01/2020 20:05   DG Chest Port 1 View  Result Date: 12/01/2020 CLINICAL DATA:  Acute respiratory failure with hypoxia EXAM: PORTABLE CHEST 1 VIEW COMPARISON:  November 30, 2020. FINDINGS: Stable cardiomediastinal silhouette. Mild central pulmonary vascular congestion is noted. Increased bilateral lung opacities are noted concerning for edema or pneumonia. Bony thorax is unremarkable. IMPRESSION: Mild central pulmonary vascular congestion. Increased bilateral lung opacities are noted concerning for edema or pneumonia. Electronically Signed   By: Marijo Conception M.D.   On: 12/01/2020 09:09   DG Chest Port 1 View  Result Date: 11/30/2020 CLINICAL DATA:  Questionable sepsis.  Evaluate for abnormality. EXAM: PORTABLE CHEST 1 VIEW COMPARISON:  None. FINDINGS: Lungs are clear. Heart and mediastinum are within normal limits. Trachea is midline. No acute bone abnormality. Negative for a pneumothorax. IMPRESSION: No active disease. Electronically Signed   By: Markus Daft M.D.   On: 11/30/2020 14:19   DG C-Arm 1-60 Min  Result Date: 12/01/2020 CLINICAL DATA:  Epidural abscess EXAM: CERVICAL SPINE - 2-3 VIEW; DG C-ARM 1-60 MIN COMPARISON:  MRI cervical spine 11/30/2020 FINDINGS: AP and lateral C-arm images were obtained of the cervical spine. The lateral view, there are surgical instruments posteriorly at C2-3. Instruments are posterior to the spinous processes of C2 and C3. No skeletal abnormality. IMPRESSION: Surgical localization C2-3 posteriorly.  Electronically Signed   By: Franchot Gallo M.D.   On: 12/01/2020 18:10   DG C-Arm 1-60 Min-No Report  Result Date: 12/10/2020 Fluoroscopy was utilized by the requesting physician.  No radiographic interpretation.   ECHOCARDIOGRAM COMPLETE  Result Date: 12/01/2020    ECHOCARDIOGRAM REPORT   Patient Name:   SENAI KINGSLEY Date of Exam: 12/01/2020 Medical Rec #:  [id number]   Height:       69.0 in Accession #:    [id number]  Weight:       161.8 lb Date of Birth:  1975/04/14   BSA:          1.888 m Patient Age:    1 years    BP:           139/97 mmHg Patient Gender: M           HR:           112 bpm. Exam Location:  [facility] Procedure: 2D Echo, Cardiac Doppler and Color Doppler Indications:     Bacteremia R78.81  History:         Patient has no prior history of Echocardiogram examinations. No                  past medical history on file.  Sonographer:  [name] Referring Phys:  [id number] Tsosie Billing Diagnosing Phys: Kate Sable MD  Sonographer Comments: Suboptimal apical window. IMPRESSIONS  1. Left ventricular ejection fraction, by estimation, is 70 to 75%. The left ventricle has hyperdynamic function. The left ventricle has no regional wall motion abnormalities. Left ventricular diastolic parameters are consistent with Grade I diastolic dysfunction (impaired relaxation).  2. Right ventricular systolic function is normal. The right ventricular size is normal.  3. The mitral valve is normal in structure. No evidence of mitral valve regurgitation.  4. The aortic valve is grossly normal. Aortic valve regurgitation is not visualized.  5. Aortic dilatation noted. There is mild dilatation of the aortic root, measuring 41 mm. Conclusion(s)/Recommendation(s): No evidence of valvular vegetations on this transthoracic echocardiogram. Consider a transesophageal echocardiogram to exclude infective endocarditis if clinically indicated. FINDINGS  Left Ventricle: Left ventricular ejection fraction, by  estimation, is 70 to 75%. The left ventricle has hyperdynamic function. The left ventricle has no regional wall motion abnormalities. The left ventricular internal cavity size was normal in size. There is no left ventricular hypertrophy. Left ventricular diastolic parameters are consistent with Grade I diastolic dysfunction (impaired relaxation). Right Ventricle: The right ventricular size is normal. No increase in right ventricular wall thickness. Right ventricular systolic function is normal. Left Atrium: Left atrial size was normal in size. Right Atrium: Right atrial size was normal in size. Pericardium: There is no evidence of pericardial effusion. Mitral Valve: The mitral valve is normal in structure. No evidence of mitral valve regurgitation. Tricuspid Valve: The tricuspid valve is normal in structure. Tricuspid valve regurgitation is not demonstrated. Aortic Valve: The aortic valve is grossly normal. Aortic valve regurgitation is not visualized. Aortic valve mean gradient measures 8.0 mmHg. Aortic valve peak gradient measures 13.7 mmHg. Aortic valve area, by VTI measures 4.02 cm. Pulmonic Valve: The pulmonic valve was not well visualized. Pulmonic valve regurgitation is not visualized. Aorta: Aortic dilatation noted. There is mild dilatation of the aortic root, measuring 41 mm. Venous: The inferior vena cava was not well visualized. IAS/Shunts: No atrial level shunt detected by color flow Doppler.  LEFT VENTRICLE PLAX 2D LVIDd:         4.41 cm   Diastology LVIDs:         2.81 cm   LV e' medial:    7.29 cm/s LV PW:         1.16 cm   LV E/e' medial:  10.8 LV IVS:        1.27 cm   LV e' lateral:   7.83 cm/s LVOT diam:     2.30 cm   LV E/e' lateral: 10.0 LV SV:         96 LV SV Index:   51 LVOT Area:     4.15 cm  RIGHT VENTRICLE RV S prime:     19.70 cm/s LEFT ATRIUM             Index        RIGHT ATRIUM           Index LA diam:        3.40 cm 1.80 cm/m   RA Area:     20.40 cm LA Vol (A2C):   52.1 ml 27.59  ml/m  RA Volume:   64.80 ml  34.32 ml/m LA Vol (A4C):   57.3 ml 30.35 ml/m LA Biplane Vol: 55.3 ml 29.29 ml/m  AORTIC VALVE  PULMONIC VALVE AV Area (Vmax):    3.77 cm      PV Vmax:        1.52 m/s AV Area (Vmean):   3.59 cm      PV Vmean:       106.000 cm/s AV Area (VTI):     4.02 cm      PV VTI:         0.236 m AV Vmax:           185.00 cm/s   PV Peak grad:   9.2 mmHg AV Vmean:          125.000 cm/s  PV Mean grad:   5.0 mmHg AV VTI:            0.240 m       RVOT Peak grad: 10 mmHg AV Peak Grad:      13.7 mmHg AV Mean Grad:      8.0 mmHg LVOT Vmax:         168.00 cm/s LVOT Vmean:        108.000 cm/s LVOT VTI:          0.232 m LVOT/AV VTI ratio: 0.97  AORTA Ao Root diam: 3.83 cm MITRAL VALVE                TRICUSPID VALVE MV Area (PHT): 4.80 cm     TR Peak grad:   14.3 mmHg MV Decel Time: 158 msec     TR Vmax:        189.00 cm/s MV E velocity: 78.40 cm/s MV A velocity: 110.00 cm/s  SHUNTS MV E/A ratio:  0.71         Systemic VTI:  0.23 m                             Systemic Diam: 2.30 cm                             Pulmonic VTI:  0.183 m Kate Sable MD Electronically signed by Kate Sable MD Signature Date/Time: 12/01/2020/3:53:46 PM    Final    ECHO TEE  Result Date: 12/15/2020    TRANSESOPHOGEAL ECHO REPORT   Patient Name:   ARISH REDNER Date of Exam: 12/15/2020 Medical Rec #:  [id number]   Height:       69.0 in Accession #:    [id number]  Weight:       161.8 lb Date of Birth:  17-Mar-1975   BSA:          1.888 m Patient Age:    84 years    BP:           138/83 mmHg Patient Gender: M           HR:           63 bpm. Exam Location:  [facility] Procedure: Transesophageal Echo, Cardiac Doppler and Color Doppler Indications:     Not listed on TEE check-in sheet  History:         Patient has prior history of Echocardiogram examinations, most                  recent 12/01/2020. No past medical history on file.  Sonographer:     [name] Referring Phys:  [id number] Thurmond Butts MATTHEW ORGEL  Diagnosing Phys: Donnelly Angelica PROCEDURE: TEE procedure time was 15 minutes. The transesophogeal  probe was passed without difficulty through the esophogus of the patient. Local oropharyngeal anesthetic was provided with viscous lidocaine. Sedation performed by performing physician. Image quality was good. The patient's vital signs; including heart rate, blood pressure, and oxygen saturation; remained stable throughout the procedure. The patient developed no complications during the procedure. IMPRESSIONS  1. The left ventricle has normal function.  2. No left atrial/left atrial appendage thrombus was detected.  3. The mitral valve is normal in structure. Trivial mitral valve regurgitation.  4. The aortic valve is tricuspid. Aortic valve regurgitation is trivial. Conclusion(s)/Recommendation(s): No evidence of vegetation/infective endocarditis on this transesophageael echocardiogram. FINDINGS  Left Ventricle: The left ventricle has normal function. Left Atrium: Left atrial size was normal in size. No left atrial/left atrial appendage thrombus was detected. Right Atrium: Right atrial size was normal in size. Mitral Valve: The mitral valve is normal in structure. Trivial mitral valve regurgitation. There is no evidence of mitral valve vegetation. Tricuspid Valve: The tricuspid valve is normal in structure. Tricuspid valve regurgitation is trivial. There is no evidence of tricuspid valve vegetation. Aortic Valve: The aortic valve is tricuspid. Aortic valve regurgitation is trivial. There is no evidence of aortic valve vegetation. Pulmonic Valve: The pulmonic valve was grossly normal. Pulmonic valve regurgitation is not visualized. Aorta: The aortic root and ascending aorta are structurally normal, with no evidence of dilitation. IAS/Shunts: No atrial level shunt detected by color flow Doppler.   AORTA Ao Asc diam: 3.82 cm Donnelly Angelica Electronically signed by Donnelly Angelica Signature Date/Time: 12/15/2020/1:12:15 PM    Final     [place] EKG SITE RITE  Result Date: 12/15/2020 If Site Rite image not attached, placement could not be confirmed due to current cardiac rhythm.  US Abdomen Limited RUQ (LIVER/GB)  Result Date: 12/01/2020 CLINICAL DATA:  Abnormal LFTs EXAM: ULTRASOUND ABDOMEN LIMITED RIGHT UPPER QUADRANT COMPARISON:  CT from 11/30/2020 FINDINGS: Gallbladder: Gallbladder is well distended with evidence of gallbladder sludge. No definitive cholelithiasis is seen. No wall thickening or pericholecystic fluid is noted. Negative sonographic Murphy's sign is elicited. Common bile duct: Diameter: 5.3 mm. Liver: No focal lesion identified. Within normal limits in parenchymal echogenicity. Portal vein is patent on color Doppler imaging with normal direction of blood flow towards the liver. Other: Note is made of known right renal stones without obstructing change. IMPRESSION: Gallbladder sludge without evidence cholelithiasis. Nonobstructing right renal calculi similar to that seen on prior CT. Electronically Signed   By: Inez Catalina M.D.   On: 12/01/2020 15:29   DG Lumbar Puncture Fluoro Guide  Result Date: 11/30/2020 CLINICAL DATA:  Altered mental status with neck pain and back pain concern for meningitis request received for lumbar puncture. EXAM: DIAGNOSTIC LUMBAR PUNCTURE UNDER FLUOROSCOPIC GUIDANCE COMPARISON:  CT head imaging report reviewed prior to the procedure performed same day. FLUOROSCOPY TIME:  Fluoroscopy Time:  0.2 minute Radiation Exposure Index (if provided by the fluoroscopic device): 1.10 mGy Number of Acquired Spot Images: 1 PROCEDURE: Informed consent was obtained from the patient's wife prior to the procedure, including potential complications of bleeding, infection, CSF leak and need for additional procedures, inability to perform the procedure if patient is unable to cooperate, nerve damage, headache, allergy, and pain. With the patient prone, the lower back was prepped with Betadine. 1% Lidocaine was  used for local anesthesia. Lumbar puncture was attempted to be performed at the L4-L5 level using a 22 gauge needle, however the patient was unable to tolerate the procedure or lie flat, the needle was  removed in its entirety and the procedure was aborted. No immediate complications, sterile dressing applied. IMPRESSION: Unsuccessful attempt at fluoroscopic guided lumbar puncture secondary to patient's inability to tolerate the procedure, procedure was aborted for safety reasons. The ordering provider was contacted regarding the inability to perform the procedure. Read By: Tsosie Billing PA-C Electronically Signed   By: Maurine Simmering M.D.   On: 11/30/2020 17:01   Results for orders placed or performed during the hospital encounter of 11/30/20  Blood Culture (routine x 2)     Status: Abnormal   Collection Time: 11/30/20  2:54 PM   Specimen: BLOOD  Result Value Ref Range Status   Specimen Description   Final    BLOOD LEFT ANTECUBITAL Performed at [facility], [address] [place], [place] [address]    Special Requests   Final    BOTTLES DRAWN AEROBIC AND ANAEROBIC Blood Culture results may not be optimal due to an excessive volume of blood received in culture bottles Performed at [facility], [address] [place], [place] [address]    Culture  Setup Time   Final    Organism ID to follow [place] CRITICAL RESULT CALLED TO, READ BACK BY AND VERIFIED WITH: NATHAN BELUE _0  ON 12/01/20 SKL    Culture METHICILLIN RESISTANT STAPHYLOCOCCUS AUREUS (A)  Final   Report Status 12/03/2020 FINAL  Final   Organism ID, Bacteria METHICILLIN RESISTANT STAPHYLOCOCCUS AUREUS  Final      Susceptibility   Methicillin resistant staphylococcus aureus - MIC*    CIPROFLOXACIN >=8 RESISTANT Resistant     ERYTHROMYCIN >=8 RESISTANT Resistant     GENTAMICIN <=0.5 SENSITIVE Sensitive     OXACILLIN >=4 RESISTANT Resistant     TETRACYCLINE <=1  SENSITIVE Sensitive     VANCOMYCIN 1 SENSITIVE Sensitive     TRIMETH/SULFA <=10 SENSITIVE Sensitive     CLINDAMYCIN <=0.25 SENSITIVE Sensitive     RIFAMPIN <=0.5 SENSITIVE Sensitive     Inducible Clindamycin NEGATIVE Sensitive     * METHICILLIN RESISTANT STAPHYLOCOCCUS AUREUS  Blood Culture (routine x 2)     Status: Abnormal   Collection Time: 11/30/20  2:54 PM   Specimen: BLOOD  Result Value Ref Range Status   Specimen Description   Final    BLOOD BLOOD RIGHT HAND Performed at [facility], [address], [place], [place] [address]    Special Requests   Final    BOTTLES DRAWN AEROBIC AND ANAEROBIC Blood Culture adequate volume Performed at [facility], [address] [place], [place] [address]    Culture  Setup Time   Final    GRAM POSITIVE COCCI IN BOTH AEROBIC AND ANAEROBIC BOTTLES CRITICAL VALUE NOTED.  VALUE IS CONSISTENT WITH PREVIOUSLY REPORTED AND CALLED VALUE.    Culture (A)  Final    STAPHYLOCOCCUS AUREUS SUSCEPTIBILITIES PERFORMED ON PREVIOUS CULTURE WITHIN THE LAST 5 DAYS. Performed at [facility], [place] [address], [place], [place] [address]    Report Status 12/03/2020 FINAL  Final  Resp Panel by RT-PCR (Flu A&B, Covid) Nasopharyngeal Swab     Status: None   Collection Time: 11/30/20  2:54 PM   Specimen: Nasopharyngeal Swab; Nasopharyngeal(NP) swabs in vial transport medium  Result Value Ref Range Status   SARS Coronavirus 2 by RT PCR NEGATIVE NEGATIVE Final    Comment: (NOTE) SARS-CoV-2 target nucleic acids are NOT DETECTED.  The SARS-CoV-2 RNA is generally detectable in upper respiratory specimens  during the acute phase of infection. The lowest concentration of SARS-CoV-2 viral copies this assay can detect is 138 copies/mL. A negative result does not preclude SARS-Cov-2 infection and should not be used as the sole basis for treatment or other patient management decisions. A negative result may occur with  improper specimen  collection/handling, submission of specimen other than nasopharyngeal swab, presence of viral mutation(s) within the areas targeted by this assay, and inadequate number of viral copies(<138 copies/mL). A negative result must be combined with clinical observations, patient history, and epidemiological information. The expected result is Negative.  Fact Sheet for Patients:  EntrepreneurPulse.com.au  Fact Sheet for Healthcare Providers:  IncredibleEmployment.be  This test is no t yet approved or cleared by the [place] FDA and  has been authorized for detection and/or diagnosis of SARS-CoV-2 by FDA under an Emergency Use Authorization (EUA). This EUA will remain  in effect (meaning this test can be used) for the duration of the COVID-19 declaration under Section 564(b)(1) of the Act, 21 U.S.C.section 360bbb-3(b)(1), unless the authorization is terminated  or revoked sooner.       Influenza A by PCR NEGATIVE NEGATIVE Final   Influenza B by PCR NEGATIVE NEGATIVE Final    Comment: (NOTE) The Xpert Xpress SARS-CoV-2/FLU/RSV plus assay is intended as an aid in the diagnosis of influenza from Nasopharyngeal swab specimens and should not be used as a sole basis for treatment. Nasal washings and aspirates are unacceptable for Xpert Xpress SARS-CoV-2/FLU/RSV testing.  Fact Sheet for Patients: EntrepreneurPulse.com.au  Fact Sheet for Healthcare Providers: IncredibleEmployment.be  This test is not yet approved or cleared by the [place] FDA and has been authorized for detection and/or diagnosis of SARS-CoV-2 by FDA under an Emergency Use Authorization (EUA). This EUA will remain in effect (meaning this test can be used) for the duration of the COVID-19 declaration under Section 564(b)(1) of the Act, 21 U.S.C. section 360bbb-3(b)(1), unless the authorization is terminated or revoked.  Performed at [facility], [address] [place], [place] [address]   Blood Culture ID Panel (Reflexed)     Status: Abnormal   Collection Time: 11/30/20  2:54 PM  Result Value Ref Range Status   Enterococcus faecalis NOT DETECTED NOT DETECTED Final   Enterococcus Faecium NOT DETECTED NOT DETECTED Final   Listeria monocytogenes NOT DETECTED NOT DETECTED Final   Staphylococcus species DETECTED (A) NOT DETECTED Final    Comment: CRITICAL RESULT CALLED TO, READ BACK BY AND VERIFIED WITH: NATHAN BELUE _0  ON 12/01/20 SKL    Staphylococcus aureus (BCID) DETECTED (A) NOT DETECTED Final    Comment: Methicillin (oxacillin)-resistant Staphylococcus aureus (MRSA). MRSA is predictably resistant to beta-lactam antibiotics (except ceftaroline). Preferred therapy is vancomycin unless clinically contraindicated. Patient requires contact precautions if  hospitalized. CRITICAL RESULT CALLED TO, READ BACK BY AND VERIFIED WITH: NATHAN BELUE _1  ON 12/01/20 SKL    Staphylococcus epidermidis NOT DETECTED NOT DETECTED Final   Staphylococcus lugdunensis NOT DETECTED NOT DETECTED Final   Streptococcus species NOT DETECTED NOT DETECTED Final   Streptococcus agalactiae NOT DETECTED NOT DETECTED Final   Streptococcus pneumoniae NOT DETECTED NOT DETECTED Final   Streptococcus pyogenes NOT DETECTED NOT DETECTED Final   A.calcoaceticus-baumannii NOT DETECTED NOT DETECTED Final   Bacteroides fragilis NOT DETECTED NOT DETECTED Final   Enterobacterales NOT DETECTED NOT DETECTED Final   Enterobacter cloacae complex NOT DETECTED NOT DETECTED Final   Escherichia coli NOT DETECTED NOT DETECTED Final   Klebsiella aerogenes NOT DETECTED NOT DETECTED Final  Klebsiella oxytoca NOT DETECTED NOT DETECTED Final   Klebsiella pneumoniae NOT DETECTED NOT DETECTED Final   Proteus species NOT DETECTED NOT DETECTED Final   Salmonella species NOT DETECTED NOT DETECTED Final   Serratia marcescens NOT DETECTED NOT DETECTED Final    Haemophilus influenzae NOT DETECTED NOT DETECTED Final   Neisseria meningitidis NOT DETECTED NOT DETECTED Final   Pseudomonas aeruginosa NOT DETECTED NOT DETECTED Final   Stenotrophomonas maltophilia NOT DETECTED NOT DETECTED Final   Candida albicans NOT DETECTED NOT DETECTED Final   Candida auris NOT DETECTED NOT DETECTED Final   Candida glabrata NOT DETECTED NOT DETECTED Final   Candida krusei NOT DETECTED NOT DETECTED Final   Candida parapsilosis NOT DETECTED NOT DETECTED Final   Candida tropicalis NOT DETECTED NOT DETECTED Final   Cryptococcus neoformans/gattii NOT DETECTED NOT DETECTED Final   Meth resistant mecA/C and MREJ DETECTED (A) NOT DETECTED Final    Comment: CRITICAL RESULT CALLED TO, READ BACK BY AND VERIFIED WITH: NATHAN BELUE _0  ON 12/01/20 SKL Performed at [facility], [address], [place], [place] [address]   Urine Culture     Status: None   Collection Time: 12/01/20  6:18 AM   Specimen: Urine, Clean Catch  Result Value Ref Range Status   Specimen Description   Final    URINE, CLEAN CATCH Performed at [facility], [address] [place], [place] [address]    Special Requests   Final    NONE Performed at [facility], [address], [place], [place] [address]    Culture   Final    NO GROWTH Performed at [facility], [address], [place], [place] [address]    Report Status 12/02/2020 FINAL  Final  MRSA Next Gen by PCR, Nasal     Status: Abnormal   Collection Time: 12/01/20  8:40 AM   Specimen: Nasal Mucosa; Nasal Swab  Result Value Ref Range Status   MRSA by PCR Next Gen DETECTED (A) NOT DETECTED Final    Comment: RESULT CALLED TO, READ BACK BY AND VERIFIED WITH: CHARLIE FLEETWOOD 12/01/2020 1447 JGF Performed at [facility], [address], [place] [address]   Aerobic Culture w Gram Stain (superficial specimen)     Status: None   Collection Time: 12/01/20 12:44 PM   Specimen: Wound   Result Value Ref Range Status   Specimen Description   Final    WOUND Performed at [facility], [address] [place], [place] [address]    Special Requests   Final    NONE Performed at [facility], [address] [place], [place] [address]    Gram Stain   Final    RARE WBC PRESENT, PREDOMINANTLY MONONUCLEAR RARE GRAM POSITIVE COCCI Performed at [facility], [address] [place], [place] [address]    Culture FEW METHICILLIN RESISTANT STAPHYLOCOCCUS AUREUS  Final   Report Status 12/04/2020 FINAL  Final   Organism ID, Bacteria METHICILLIN RESISTANT STAPHYLOCOCCUS AUREUS  Final      Susceptibility   Methicillin resistant staphylococcus aureus - MIC*    CIPROFLOXACIN >=8 RESISTANT Resistant     ERYTHROMYCIN >=8 RESISTANT Resistant     GENTAMICIN <=0.5 SENSITIVE Sensitive     OXACILLIN >=4 RESISTANT Resistant     TETRACYCLINE <=1 SENSITIVE Sensitive     VANCOMYCIN <=0.5 SENSITIVE Sensitive     TRIMETH/SULFA <=10 SENSITIVE Sensitive     CLINDAMYCIN <=0.25 SENSITIVE Sensitive  RIFAMPIN <=0.5 SENSITIVE Sensitive     Inducible Clindamycin NEGATIVE Sensitive     * FEW METHICILLIN RESISTANT STAPHYLOCOCCUS AUREUS  Aerobic/Anaerobic Culture w Gram Stain (surgical/deep wound)     Status: None   Collection Time: 12/01/20  5:55 PM   Specimen: PATH Cytology Misc. fluid; Body Fluid  Result Value Ref Range Status   Specimen Description   Final    WOUND EPIDURALABSCESS Performed at [facility], [address], [place], [place] [address]    Special Requests   Final    NONE Performed at [facility], [address] [place], [place] [address]    Gram Stain   Final    NO SQUAMOUS EPITHELIAL CELLS SEEN FEW WBC SEEN FEW GRAM POSITIVE COCCI    Culture   Final    FEW METHICILLIN RESISTANT STAPHYLOCOCCUS AUREUS NO ANAEROBES ISOLATED Performed at [facility], [address], [place], [place] [address]    Report Status  12/07/2020 FINAL  Final   Organism ID, Bacteria METHICILLIN RESISTANT STAPHYLOCOCCUS AUREUS  Final      Susceptibility   Methicillin resistant staphylococcus aureus - MIC*    CIPROFLOXACIN >=8 RESISTANT Resistant     ERYTHROMYCIN >=8 RESISTANT Resistant     GENTAMICIN <=0.5 SENSITIVE Sensitive     OXACILLIN >=4 RESISTANT Resistant     TETRACYCLINE <=1 SENSITIVE Sensitive     VANCOMYCIN 1 SENSITIVE Sensitive     TRIMETH/SULFA <=10 SENSITIVE Sensitive     CLINDAMYCIN <=0.25 SENSITIVE Sensitive     RIFAMPIN <=0.5 SENSITIVE Sensitive     Inducible Clindamycin NEGATIVE Sensitive     * FEW METHICILLIN RESISTANT STAPHYLOCOCCUS AUREUS  CULTURE, BLOOD (ROUTINE X 2) w Reflex to ID Panel     Status: Abnormal   Collection Time: 12/02/20  7:12 PM   Specimen: BLOOD  Result Value Ref Range Status   Specimen Description   Final    BLOOD LEFT ANTECUBITAL Performed at [facility], [address], [place], [place] [address]    Special Requests   Final    BOTTLES DRAWN AEROBIC AND ANAEROBIC Blood Culture adequate volume Performed at [facility], [address] [place], [place] [address]    Culture  Setup Time   Final    GRAM POSITIVE COCCI ANAEROBIC BOTTLE ONLY CRITICAL VALUE NOTED.  VALUE IS CONSISTENT WITH PREVIOUSLY REPORTED AND CALLED VALUE. Performed at [facility], [address] [place], [place] [address]    Culture (A)  Final    STAPHYLOCOCCUS AUREUS SUSCEPTIBILITIES PERFORMED ON PREVIOUS CULTURE WITHIN THE LAST 5 DAYS. Performed at [facility], [place] [place], [place] [address]    Report Status 12/06/2020 FINAL  Final  CULTURE, BLOOD (ROUTINE X 2) w Reflex to ID Panel     Status: Abnormal   Collection Time: 12/02/20  7:13 PM   Specimen: BLOOD  Result Value Ref Range Status   Specimen Description   Final    BLOOD BLOOD LEFT WRIST Performed at [facility], [address] [place], [place] [address]    Special Requests    Final    BOTTLES DRAWN AEROBIC AND ANAEROBIC Blood Culture adequate volume Performed at [facility], [address] [place], [place] [address]    Culture  Setup Time   Final    GRAM POSITIVE COCCI ANAEROBIC BOTTLE ONLY CRITICAL RESULT CALLED TO, READ BACK BY AND VERIFIED WITH: SHEEMA HALLAJI AT 2030 ON 12/03/20 BY SS    Culture (  A)  Final    STAPHYLOCOCCUS AUREUS SUSCEPTIBILITIES PERFORMED ON PREVIOUS CULTURE WITHIN THE LAST 5 DAYS. Performed at [facility], [place] [address], [place], [place] [address]    Report Status 12/05/2020 FINAL  Final  Blood Culture ID Panel (Reflexed)     Status: Abnormal   Collection Time: 12/02/20  7:13 PM  Result Value Ref Range Status   Enterococcus faecalis NOT DETECTED NOT DETECTED Final   Enterococcus Faecium NOT DETECTED NOT DETECTED Final   Listeria monocytogenes NOT DETECTED NOT DETECTED Final   Staphylococcus species DETECTED (A) NOT DETECTED Final    Comment: CRITICAL RESULT CALLED TO, READ BACK BY AND VERIFIED WITH: SHEEMA HALLAJI AT 2030 ON 12/03/20 BY SS    Staphylococcus aureus (BCID) DETECTED (A) NOT DETECTED Final    Comment: Methicillin (oxacillin)-resistant Staphylococcus aureus (MRSA). MRSA is predictably resistant to beta-lactam antibiotics (except ceftaroline). Preferred therapy is vancomycin unless clinically contraindicated. Patient requires contact precautions if  hospitalized. CRITICAL RESULT CALLED TO, READ BACK BY AND VERIFIED WITH: SHEEMA HALLAJI AT 2030 ON 12/03/20 BY SS    Staphylococcus epidermidis NOT DETECTED NOT DETECTED Final   Staphylococcus lugdunensis NOT DETECTED NOT DETECTED Final   Streptococcus species NOT DETECTED NOT DETECTED Final   Streptococcus agalactiae NOT DETECTED NOT DETECTED Final   Streptococcus pneumoniae NOT DETECTED NOT DETECTED Final   Streptococcus pyogenes NOT DETECTED NOT DETECTED Final   A.calcoaceticus-baumannii NOT DETECTED NOT DETECTED Final   Bacteroides fragilis NOT  DETECTED NOT DETECTED Final   Enterobacterales NOT DETECTED NOT DETECTED Final   Enterobacter cloacae complex NOT DETECTED NOT DETECTED Final   Escherichia coli NOT DETECTED NOT DETECTED Final   Klebsiella aerogenes NOT DETECTED NOT DETECTED Final   Klebsiella oxytoca NOT DETECTED NOT DETECTED Final   Klebsiella pneumoniae NOT DETECTED NOT DETECTED Final   Proteus species NOT DETECTED NOT DETECTED Final   Salmonella species NOT DETECTED NOT DETECTED Final   Serratia marcescens NOT DETECTED NOT DETECTED Final   Haemophilus influenzae NOT DETECTED NOT DETECTED Final   Neisseria meningitidis NOT DETECTED NOT DETECTED Final   Pseudomonas aeruginosa NOT DETECTED NOT DETECTED Final   Stenotrophomonas maltophilia NOT DETECTED NOT DETECTED Final   Candida albicans NOT DETECTED NOT DETECTED Final   Candida auris NOT DETECTED NOT DETECTED Final   Candida glabrata NOT DETECTED NOT DETECTED Final   Candida krusei NOT DETECTED NOT DETECTED Final   Candida parapsilosis NOT DETECTED NOT DETECTED Final   Candida tropicalis NOT DETECTED NOT DETECTED Final   Cryptococcus neoformans/gattii NOT DETECTED NOT DETECTED Final   Meth resistant mecA/C and MREJ DETECTED (A) NOT DETECTED Final    Comment: CRITICAL RESULT CALLED TO, READ BACK BY AND VERIFIED WITH: SHEEMA HALLAJI AT 2030 ON 12/03/20 BY SS Performed at [facility], [address] [place], [place] [address]   CULTURE, BLOOD (ROUTINE X 2) w Reflex to ID Panel     Status: None   Collection Time: 12/04/20  4:28 AM   Specimen: BLOOD  Result Value Ref Range Status   Specimen Description BLOOD LAC  Final   Special Requests   Final    BOTTLES DRAWN AEROBIC AND ANAEROBIC Blood Culture adequate volume   Culture   Final    NO GROWTH 5 DAYS Performed at [facility], [address] [place], [place] [address]    Report Status 12/09/2020 FINAL  Final  CULTURE, BLOOD (ROUTINE X 2) w Reflex to ID Panel     Status: None  Collection  Time: 12/04/20  4:37 AM   Specimen: BLOOD  Result Value Ref Range Status   Specimen Description BLOOD LHAND  Final   Special Requests   Final    BOTTLES DRAWN AEROBIC AND ANAEROBIC Blood Culture adequate volume   Culture   Final    NO GROWTH 5 DAYS Performed at [facility], [address], [place], [place] [address]    Report Status 12/09/2020 FINAL  Final  Aerobic/Anaerobic Culture w Gram Stain (surgical/deep wound)     Status: None   Collection Time: 12/08/20  5:01 PM   Specimen: Wound; Abscess  Result Value Ref Range Status   Specimen Description   Final    WOUND RIGHT FINGER Performed at [facility], [place] [address] [place], [place] [address]    Special Requests   Final    NONE Performed at [facility], [place], [place] [address]    Gram Stain   Final    FEW SQUAMOUS EPITHELIAL CELLS PRESENT FEW WBC PRESENT, PREDOMINANTLY MONONUCLEAR RARE GRAM POSITIVE COCCI    Culture   Final    RARE METHICILLIN RESISTANT STAPHYLOCOCCUS AUREUS NO ANAEROBES ISOLATED Performed at [facility], [place] [address] [place], [place] [address]    Report Status 12/13/2020 FINAL  Final   Organism ID, Bacteria METHICILLIN RESISTANT STAPHYLOCOCCUS AUREUS  Final      Susceptibility   Methicillin resistant staphylococcus aureus - MIC*    CIPROFLOXACIN >=8 RESISTANT Resistant     ERYTHROMYCIN >=8 RESISTANT Resistant     GENTAMICIN <=0.5 SENSITIVE Sensitive     OXACILLIN >=4 RESISTANT Resistant     TETRACYCLINE <=1 SENSITIVE Sensitive     VANCOMYCIN 1 SENSITIVE Sensitive     TRIMETH/SULFA <=10 SENSITIVE Sensitive     CLINDAMYCIN <=0.25 SENSITIVE Sensitive     RIFAMPIN <=0.5 SENSITIVE Sensitive     Inducible Clindamycin NEGATIVE Sensitive     * RARE METHICILLIN RESISTANT STAPHYLOCOCCUS AUREUS  Anaerobic culture w Gram Stain     Status: None   Collection Time: 12/10/20  3:22 PM   Specimen: PATH Other; Tissue  Result Value Ref Range Status    Specimen Description   Final    ABSCESS Performed at [facility], [address] [place], [place] [address]    Special Requests   Final    EPIDURAL ABSCESS Performed at [facility], [place] [place], [place] [address]    Gram Stain   Final    FEW WBC PRESENT, PREDOMINANTLY MONONUCLEAR FEW GRAM POSITIVE COCCI    Culture   Final    NO ANAEROBES ISOLATED Performed at [facility], [place] [address], [place], [place] [address]    Report Status 12/15/2020 FINAL  Final  CULTURE, BLOOD (ROUTINE X 2) w Reflex to ID Panel     Status: None   Collection Time: 12/11/20  5:15 AM   Specimen: BLOOD  Result Value Ref Range Status   Specimen Description BLOOD LAC  Final   Special Requests   Final    BOTTLES DRAWN AEROBIC AND ANAEROBIC Blood Culture adequate volume   Culture   Final    NO GROWTH 5 DAYS Performed at [facility], [address], [place],  [address]    Report Status 12/16/2020 FINAL  Final  CULTURE, BLOOD (ROUTINE X 2) w Reflex to ID Panel     Status: None   Collection Time: 12/11/20  5:15 AM   Specimen:  BLOOD  Result Value Ref Range Status   Specimen Description BLOOD LHAND  Final   Special Requests   Final    BOTTLES DRAWN AEROBIC AND ANAEROBIC Blood Culture adequate volume   Culture   Final    NO GROWTH 5 DAYS Performed at [facility], [place], [address] [place], [place] [address]    Report Status 12/16/2020 FINAL  Final    Signed:  Max Sane MD.  [facility] 12/18/2020, 4:32 PM

## 2020-12-20 LAB — BLOOD GAS, VENOUS
Acid-Base Excess: 0 mmol/L (ref 0.0–2.0)
Bicarbonate: 24.8 mmol/L (ref 20.0–28.0)
O2 Saturation: 52.8 %
Patient temperature: 37
pCO2, Ven: 40 mmHg — ABNORMAL LOW (ref 44.0–60.0)
pH, Ven: 7.4 (ref 7.250–7.430)

## 2020-12-30 ENCOUNTER — Telehealth: Payer: Self-pay

## 2020-12-30 NOTE — Telephone Encounter (Signed)
Per Dr Delaine Lame: End date changed to 01/20/2021 and pull picc after last dose. I advised [facility] of this and verified patient is getting 1000 mg every 8 hours of vancomycin with wife and [facility]. I also did advise wife it is ok to add multivitamin orally.

## 2021-01-04 ENCOUNTER — Other Ambulatory Visit: Payer: Self-pay

## 2021-01-04 ENCOUNTER — Ambulatory Visit: Payer: BC Managed Care – PPO | Attending: Infectious Diseases | Admitting: Infectious Diseases

## 2021-01-04 ENCOUNTER — Telehealth: Payer: Self-pay

## 2021-01-04 VITALS — BP 144/91 | HR 83 | Temp 98.1°F | Resp 16 | Ht 69.0 in | Wt 158.0 lb

## 2021-01-04 DIAGNOSIS — Z8614 Personal history of Methicillin resistant Staphylococcus aureus infection: Secondary | ICD-10-CM | POA: Insufficient documentation

## 2021-01-04 DIAGNOSIS — R7881 Bacteremia: Secondary | ICD-10-CM | POA: Diagnosis not present

## 2021-01-04 DIAGNOSIS — G061 Intraspinal abscess and granuloma: Secondary | ICD-10-CM | POA: Diagnosis not present

## 2021-01-04 DIAGNOSIS — A4902 Methicillin resistant Staphylococcus aureus infection, unspecified site: Secondary | ICD-10-CM

## 2021-01-04 DIAGNOSIS — M545 Low back pain, unspecified: Secondary | ICD-10-CM | POA: Diagnosis present

## 2021-01-04 DIAGNOSIS — Z79899 Other long term (current) drug therapy: Secondary | ICD-10-CM | POA: Insufficient documentation

## 2021-01-04 DIAGNOSIS — F17210 Nicotine dependence, cigarettes, uncomplicated: Secondary | ICD-10-CM | POA: Insufficient documentation

## 2021-01-04 DIAGNOSIS — Z09 Encounter for follow-up examination after completed treatment for conditions other than malignant neoplasm: Secondary | ICD-10-CM | POA: Diagnosis not present

## 2021-01-04 DIAGNOSIS — B9562 Methicillin resistant Staphylococcus aureus infection as the cause of diseases classified elsewhere: Secondary | ICD-10-CM

## 2021-01-04 NOTE — Telephone Encounter (Signed)
Notified HH and [facility] to pull picc 01/21/2021 and verified end date 01/20/21. Patient to start oral abx 01/21/21.

## 2021-01-04 NOTE — Progress Notes (Signed)
NAME: Caleb Fisher  DOB: [birth year]  MRN: [id number]  Date/Time: 01/04/2021 10:10 AM  Subjective:   ? Caleb Fisher is a 45 y.o. male is here after recent hospitalization between 11/30/2020 until 12-2/22.  He is here with his wife. 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 TEE was negative for endocarditis which was done on 12/15/2020. Patient initially was treated with double anti-MRSA coverage with vancomycin and ceftaroline.  After 13 days of dual therapy ceftaroline was discontinued and he was kept on vancomycin.  He was discharged home on vancomycin every 8 hour dosing.  The source of the MRSA was from his forearm excoriated wounds.  During the hospitalization he also was anemic dropping almost 5 g of hemoglobin and underwent an upper GI and colonoscopy work-up.  It was thought that you had taken NSAIDs and aspirin a lot as outpatient and gastritis was the culprit.  There was no bleeding ulcers seen. Today is here for follow-up After discharge he has seen neurosurgeon on 12/23/2020 and he also saw GI on 12/28/2020.  He is on proton pump inhibitor 40 mg twice daily for 2 weeks and then it would be reduced to daily. Patient is overall feeling better than before. He continues to have pain at the site of the lumbar surgery which is exquisite he says. He has no fever He has no diarrhea His appetite is better Excoriations on the arm as improved but he started to pick on small lesions that show up he will complete 6 weeks of IV vancomycin on 01/21/2020.  I will put him on p.o. antibiotics following that.   Past medical history Other than MRSA bacteremia and MRSA infection no significant past history Past Surgical History:  Procedure Laterality Date    COLONOSCOPY N/A 12/16/2020   Procedure: COLONOSCOPY;  Surgeon: Toledo, Benay Pike, MD;  Location: [facility] ENDOSCOPY;  Service: Gastroenterology;  Laterality: N/A;   ESOPHAGOGASTRODUODENOSCOPY N/A 12/16/2020   Procedure: ESOPHAGOGASTRODUODENOSCOPY (EGD);  Surgeon: Toledo, Benay Pike, MD;  Location: [facility] ENDOSCOPY;  Service: Gastroenterology;  Laterality: N/A;   LUMBAR LAMINECTOMY FOR EPIDURAL ABSCESS N/A 12/10/2020   Procedure: LUMBAR LAMINECTOMY FOR EPIDURAL ABSCESS;  Surgeon: Deetta Perla, MD;  Location: [facility] ORS;  Service: Neurosurgery;  Laterality: N/A;   POSTERIOR CERVICAL LAMINECTOMY FOR EPIDURAL ABSCESS N/A 12/01/2020   Procedure: POSTERIOR CERVICAL LAMINECTOMY FOR EPIDURAL ABSCESS C2-C4;  Surgeon: Meade Maw, MD;  Location: [facility] ORS;  Service: Neurosurgery;  Laterality: N/A;   TEE WITHOUT CARDIOVERSION N/A 12/15/2020   Procedure: TRANSESOPHAGEAL ECHOCARDIOGRAM (TEE);  Surgeon: Andrez Grime, MD;  Location: [facility] ORS;  Service: Cardiovascular;  Laterality: N/A;    Social History   Socioeconomic History   Marital status: Married    Spouse name: Not on file   Number of children: Not on file   Years of education: Not on file   Highest education level: Not on file  Occupational History   Not on file  Tobacco Use   Smoking status: Some Days    Types: Cigarettes   Smokeless tobacco: Not on file  Substance and Sexual Activity   Alcohol use: Yes   Drug use: Not on file   Sexual activity: Not on file  Other Topics Concern   Not on file  Social History Narrative   Not on file   Social Determinants of  Health   Financial Resource Strain: Not on file  Food Insecurity: Not on file  Transportation Needs: Not on file  Physical Activity: Not on file  Stress: Not on file  Social Connections: Not on file  Intimate Partner Violence: Not on file    Family history Dad died of sepsis/kidney issues in 2016 Many male members of his family died young No Known Allergies I? Current  Outpatient Medications  Medication Sig Dispense Refill   amLODipine (NORVASC) 10 MG tablet Take 1 tablet (10 mg total) by mouth daily. 30 tablet 0   pantoprazole (PROTONIX) 40 MG tablet Take 1 tablet (40 mg total) by mouth daily. 30 tablet 0   vancomycin IVPB Inject 1,000 mg into the vein every 8 (eight) hours. Indication:  MRSA bacteremia with cervical epidual abscess, lumbar spine epidural abscess, discitis  First Dose: Yes Last Day of Therapy:  01/18/21 Labs weekly every Monday while on IV antibiotics: CBC with differential, CMP, CRP, ESR, Vancomycin trough Labs weekly every Thursday while on IV antibiotics: Vancomycin trough, BMP Please pull PIC at completion of IV antibiotics Fax weekly labs promptly t0 Dr.Harve Spradley [phone] Method of administration:Elastomeric Method of administration may be changed at the discretion of the patient and/or caregiver's ability to self-administer the medication ordered. [facility] Per Protocol:including placement of biopatch and protocol for clearing blocked lines 99 Units 0   No current facility-administered medications for this visit.     Abtx:  Anti-infectives (From admission, onward)    None       REVIEW OF SYSTEMS:  Const: negative fever, negative chills, some Weight loss Eyes: negative diplopia or visual changes, negative eye pain ENT: negative coryza, negative sore throat Resp: negative cough, hemoptysis, dyspnea Cards: negative for chest pain, palpitations, lower extremity edema GU: negative for frequency, dysuria and hematuria GI: Negative for abdominal pain, diarrhea, bleeding, constipation Skin: Sun damaged skin arms generalized heme: negative for easy bruising and gum/nose bleeding MS: Generalized weakness Lumbar back pain Neurolo:negative for headaches, dizziness, vertigo, memory problems  Psych:  anxiety, depression  Endocrine: negative for thyroid, diabetes Allergy/Immunology- negative for any medication or food  allergies ?  Objective:  VITALS:  BP (!) 144/91    Pulse 83    Temp 98.1 F (36.7 C) (Oral)    Resp 16    Ht $R'5\' 9"'pS$  (1.753 m)    Wt 158 lb (71.7 kg)    SpO2 93%    BMI 23.33 kg/m  PHYSICAL EXAM:  General: Alert, cooperative, no distress, appears stated age.  Did not Head: Normocephalic, without obvious abnormality, atraumatic. Eyes: Conjunctivae clear, anicteric sclerae. Pupils are equal ENT not examined because of mask Neck, symmetrical, no adenopathy, thyroid: non tender no carotid bruit and no JVD. Cervical spine surgical scar healed very well   Back: No CVA tenderness. Lumbar area surgical scar has scab surrounding which there is mild erythema.  Tender to touch.   Lungs: Clear to auscultation bilaterally. No Wheezing or Rhonchi. No rales. Heart: Regular rate and rhythm, no murmur, rub or gallop. Abdomen: Soft,  Extremities: Right PICC site clean Atraumatic, no cyanosis. No edema. No clubbing Skin: Hypopigmented scars over the forearms.  Very small white spots on his forearms Lymph: Cervical, supraclavicular normal. Neurologic: Grossly non-focal Pertinent Labs Lab Results CBC    Component Value Date/Time   WBC 10.0 12/17/2020 0057   RBC 3.07 (L) 12/17/2020 0057   HGB 8.8 (L) 12/17/2020 0057   HCT 27.5 (L) 12/17/2020 0057   PLT 510 (  H) 12/17/2020 0057   MCV 89.6 12/17/2020 0057   MCH 28.7 12/17/2020 0057   MCHC 32.0 12/17/2020 0057   RDW 14.9 12/17/2020 0057   LYMPHSABS 2.0 12/06/2020 0436   MONOABS 1.3 (H) 12/06/2020 0436   EOSABS 0.0 12/06/2020 0436   BASOSABS 0.0 12/06/2020 0436    CMP Latest Ref Rng & Units 12/17/2020 12/16/2020 12/15/2020  Glucose 70 - 99 mg/dL 98 88 83  BUN 6 - 20 mg/dL $Remove'14 11 15  'zsOwVJe$ Creatinine 0.61 - 1.24 mg/dL 0.53(L) 0.53(L) 0.52(L)  Sodium 135 - 145 mmol/L 135 137 134(L)  Potassium 3.5 - 5.1 mmol/L 3.5 3.7 3.2(L)  Chloride 98 - 111 mmol/L 104 104 104  CO2 22 - 32 mmol/L $RemoveB'28 30 28  'UDThiJPO$ Calcium 8.9 - 10.3 mg/dL 7.9(L) 7.9(L) 7.6(L)  Total  Protein 6.5 - 8.1 g/dL - - -  Total Bilirubin 0.3 - 1.2 mg/dL - - -  Alkaline Phos 38 - 126 U/L - - -  AST 15 - 41 U/L - - -  ALT 0 - 44 U/L - - -      Microbiology: No results found for this or any previous visit (from the past 240 hour(s)).  IMAGING RESULTS: I have personally reviewed the films ? Impression/Recommendation ? 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  Vanco levels have been therapeutic.Marland Kitchen He will complete 6 weeks of antibiotics on 01/21/2020 Last ESR is 45 and CRP is 22 After he completes IV we will put him on p.o. Bactrim versus doxycycline   Lumbar laminectomy site has healed but the scab is present and there is some erythema around Discussed with Dr. Lacinda Axon We will do mupirocin ointment Will also cover with Aquacel dressing to give some cushion so  the scab will not rub against chair or his bed.   Discussed the management with the patient and his wife Follow-up in 2 weeks at the completion of IV antibiotic.. ___________________________________________________  Note:  This document was prepared using Systems analyst and may include unintentional dictation errors.

## 2021-01-04 NOTE — Patient Instructions (Signed)
You are ehre for follow up- the lumbar wound has scab and some erythema- you can apply mupirocin on it and can cover with this drsisng so that it does not rub on the bed and chair and make the pain worse. Will follow up 01/21/20- continue IV vancomycin as prescribed- after Iv antibiotics you will go on pill antibiotic

## 2021-01-11 ENCOUNTER — Other Ambulatory Visit: Payer: Self-pay

## 2021-01-11 ENCOUNTER — Emergency Department: Payer: BC Managed Care – PPO | Admitting: Radiology

## 2021-01-11 ENCOUNTER — Telehealth: Payer: Self-pay

## 2021-01-11 ENCOUNTER — Emergency Department: Payer: BC Managed Care – PPO

## 2021-01-11 ENCOUNTER — Emergency Department
Admission: EM | Admit: 2021-01-11 | Discharge: 2021-01-11 | Disposition: A | Discharge status: Home / Self Care | Payer: BC Managed Care – PPO | Attending: Emergency Medicine | Admitting: Emergency Medicine

## 2021-01-11 DIAGNOSIS — G8918 Other acute postprocedural pain: Secondary | ICD-10-CM | POA: Diagnosis not present

## 2021-01-11 DIAGNOSIS — F17210 Nicotine dependence, cigarettes, uncomplicated: Secondary | ICD-10-CM | POA: Diagnosis not present

## 2021-01-11 DIAGNOSIS — T82898A Other specified complication of vascular prosthetic devices, implants and grafts, initial encounter: Secondary | ICD-10-CM

## 2021-01-11 MED ORDER — OXYCODONE-ACETAMINOPHEN 10-325 MG PO TABS: 1.0000 | ORAL_TABLET | Freq: Four times a day (QID) | ORAL | 0 refills | Status: AC | PRN

## 2021-01-11 MED ORDER — LORAZEPAM 1 MG PO TABS
1.0000 mg | ORAL_TABLET | Freq: Once | ORAL | Status: AC
  Administered 2021-01-11: 13:00:00 1 mg via ORAL
  Filled 2021-01-11: qty 1

## 2021-01-11 MED ORDER — ALTEPLASE 2 MG IJ SOLR
2.0000 mg | Freq: Once | INTRAMUSCULAR | Status: AC
  Administered 2021-01-11: 10:00:00 2 mg
  Filled 2021-01-11: qty 2

## 2021-01-11 MED ORDER — OXYCODONE-ACETAMINOPHEN 5-325 MG PO TABS
1.0000 | ORAL_TABLET | ORAL | Status: DC | PRN
  Administered 2021-01-11: 09:00:00 1 via ORAL
  Filled 2021-01-11: qty 1

## 2021-01-11 MED ORDER — VANCOMYCIN HCL IN DEXTROSE 1-5 GM/200ML-% IV SOLN: 1000.0000 mg | Freq: Once | INTRAVENOUS | Status: DC

## 2021-01-11 MED ORDER — HEPARIN SOD (PORK) LOCK FLUSH 100 UNIT/ML IV SOLN
INTRAVENOUS | Status: AC
  Administered 2021-01-11: 14:00:00 5 [IU]
  Filled 2021-01-11: qty 5

## 2021-01-11 MED ORDER — ALTEPLASE 2 MG IJ SOLR: 2.0000 mg | Freq: Once | INTRAMUSCULAR | Status: DC

## 2021-01-11 NOTE — ED Triage Notes (Signed)
Pt to ED POV for worsening back pain after back and neck surgery. Pt has PICC line that states is not working, has 2 weeks left of antibiotics at home.  Reports back pain has never been decreased since d/c from hospital, was not d/c with significant amount of pain meds.  Positive blood cultures on 11/15, states has blood work done every few days.   Pt reports mainly here for PICC line not working properly. States has missed 3 dose of meds d/t PICC line hard to flush   Declines blood work at this time d/t having frequent blood draws recently.

## 2021-01-11 NOTE — Telephone Encounter (Signed)
Left vm at 6:15 am today. They then came and waited at [facility] ID in hallway and waited for [place] to open before a nurse saw them and instructed them to go to ED. I have reached out to the wife and she said [facility] advised them to go to ED due to PICC line issue and pain but they did not care to wait. They want Dr Delaine Lame to come to ED and I have given her the message as she is on the way there.

## 2021-01-11 NOTE — ED Notes (Signed)
Pt returned from IR. Wife at bedside states she does not want vanc to be given here but will admin at home, MD aware.

## 2021-01-11 NOTE — Progress Notes (Addendum)
Pt from home - says single lumen PICC had not been able to draw blood back for almost 1.5 weeks. Says he was able to get his antibiotics. Wife flushed catheter - says it was difficult .   [facility] is seeing pt.  Pt brought back into treatment area of ED after triage.  No blood return with sluggish flush.Pt did not complain of any pain or bubbling in ear. Ordered Cathflo per order set. Instilled 2.2 Cathflo (see MAR) Will re -check back in 2 hours after consulting with PICC team RN Shirlean Mylar

## 2021-01-11 NOTE — ED Notes (Signed)
D/C, medications and increase in percocet and reasons to return to ED discussed with pt, pt verbalized understanding. NAD noted. Pt ambulatory on D/C with steady gait.

## 2021-01-11 NOTE — Discharge Instructions (Signed)
We have prescribed a 5 to 7-day supply of 10 mg Percocet.  Discontinue the Norco you are taking currently, and do not mix these medications.  Continue to take the IV antibiotics via the PICC line as you have been.  Follow-up with Dr. Izora Ribas and Dr. Delaine Lame as scheduled.  Return to the ER for new, worsening, or persistent severe pain, increasing rash or redness around the wound, pus or other abnormal drainage, fever, new or worsening weakness or numbness in the legs, incontinence, or any other new or worsening symptoms that concern you.

## 2021-01-11 NOTE — ED Provider Notes (Addendum)
[facility] Emergency Department Provider Note ____________________________________________   Event Date/Time   First MD Initiated Contact with Patient 01/11/21 1058     (approximate)  I have reviewed the triage vital signs and the nursing notes.   HISTORY  Chief Complaint Back Pain and Vascular Access Problem    HPI Caleb Fisher is a 45 y.o. male with PMH as noted below, most notable for recent admission (discharged on 12/2) for MRSA bacteremia, infection of the cervical lumbar spine, and lumbar epidural abscesses status post cervical and lumbar laminectomies, who presents with persistent severe pain to the lumbar spine as well as a malfunctioning PICC line.  The patient is supposed to receive IV antibiotics until 1/6.  His PICC has not been flushing properly for the last 2 days.  The patient states that he has had persistent pain in the lumbar spine which has not significantly improved since he left the hospital.  He has been taking Norco 5 mg with no significant relief.  He denies any fever or chills and has no new weakness or numbness in the legs.  History reviewed. No pertinent past medical history.  Patient Active Problem List   Diagnosis Date Noted   Troponin I above reference range    Iron deficiency anemia due to chronic blood loss 12/15/2020   Pulmonary nodules/lesions, multiple 12/15/2020   Acute respiratory failure with hypoxia (HCC)    Epidural abscess    Transaminitis    Abnormal LFTs    Acute hypercapnic respiratory failure ([place])    Fall    Acute urinary retention 12/07/2020   MRSA bacteremia 12/01/2020   Abscess in epidural space of cervical spine 12/01/2020   Hypertensive urgency 12/01/2020   Hypokalemia 12/01/2020   Thrombocytopenia ([place]) 19/16/6060   Acute metabolic encephalopathy 04/59/9774   Severe sepsis ([place]) 12/01/2020   Altered mental status    Encephalopathy acute 11/30/2020    Past Surgical History:  Procedure  Laterality Date   COLONOSCOPY N/A 12/16/2020   Procedure: COLONOSCOPY;  Surgeon: Toledo, Benay Pike, MD;  Location: [facility] ENDOSCOPY;  Service: Gastroenterology;  Laterality: N/A;   ESOPHAGOGASTRODUODENOSCOPY N/A 12/16/2020   Procedure: ESOPHAGOGASTRODUODENOSCOPY (EGD);  Surgeon: Toledo, Benay Pike, MD;  Location: [facility] ENDOSCOPY;  Service: Gastroenterology;  Laterality: N/A;   LUMBAR LAMINECTOMY FOR EPIDURAL ABSCESS N/A 12/10/2020   Procedure: LUMBAR LAMINECTOMY FOR EPIDURAL ABSCESS;  Surgeon: Deetta Perla, MD;  Location: [facility] ORS;  Service: Neurosurgery;  Laterality: N/A;   POSTERIOR CERVICAL LAMINECTOMY FOR EPIDURAL ABSCESS N/A 12/01/2020   Procedure: POSTERIOR CERVICAL LAMINECTOMY FOR EPIDURAL ABSCESS C2-C4;  Surgeon: Meade Maw, MD;  Location: [facility] ORS;  Service: Neurosurgery;  Laterality: N/A;   TEE WITHOUT CARDIOVERSION N/A 12/15/2020   Procedure: TRANSESOPHAGEAL ECHOCARDIOGRAM (TEE);  Surgeon: Andrez Grime, MD;  Location: [facility] ORS;  Service: Cardiovascular;  Laterality: N/A;    Prior to Admission medications   Medication Sig Start Date End Date Taking? Authorizing Provider  oxyCODONE-acetaminophen (PERCOCET) 10-325 MG tablet Take 1 tablet by mouth every 6 (six) hours as needed for up to 7 days for pain. 01/11/21 01/18/21 Yes Arta Silence, MD  amLODipine (NORVASC) 10 MG tablet Take 1 tablet (10 mg total) by mouth daily. 12/18/20   Max Sane, MD  pantoprazole (PROTONIX) 40 MG tablet Take 1 tablet (40 mg total) by mouth daily. 12/17/20 01/16/21  Max Sane, MD  vancomycin IVPB Inject 1,000 mg into the vein every 8 (eight) hours. Indication:  MRSA bacteremia with cervical epidual abscess, lumbar spine epidural abscess, discitis  First Dose: Yes Last Day of Therapy:  01/18/21 Labs weekly every Monday while on IV antibiotics: CBC with differential, CMP, CRP, ESR, Vancomycin trough Labs weekly every Thursday while on IV antibiotics: Vancomycin trough, BMP Please pull PIC at  completion of IV antibiotics Fax weekly labs promptly t0 Dr.Ravishankar [phone] Method of administration:Elastomeric Method of administration may be changed at the discretion of the patient and/or caregiver's ability to self-administer the medication ordered. [facility] Care Per Protocol:including placement of biopatch and protocol for clearing blocked lines 12/17/20 01/19/21  Max Sane, MD    Allergies Patient has no known allergies.  No family history on file.  Social History Social History   Tobacco Use   Smoking status: Some Days    Types: Cigarettes  Substance Use Topics   Alcohol use: Yes    Review of Systems  Constitutional: No fever/chills Eyes: No redness. ENT: No neck pain. Cardiovascular: Denies chest pain. Respiratory: Denies shortness of breath. Gastrointestinal: No vomiting. Genitourinary: Negative for incontinence. Musculoskeletal: Positive for back pain. Skin: Negative for rash. Neurological: Negative for acute focal weakness or numbness.   ____________________________________________   PHYSICAL EXAM:  VITAL SIGNS: ED Triage Vitals  Enc Vitals Group     BP 01/11/21 0835 (!) 167/105     Pulse Rate 01/11/21 0833 85     Resp 01/11/21 0833 20     Temp 01/11/21 0833 97.8 F (36.6 C)     Temp Source 01/11/21 0833 Oral     SpO2 01/11/21 0833 98 %     Weight 01/11/21 0829 158 lb 11.7 oz (72 kg)     Height 01/11/21 0829 $RemoveBefor'5\' 9"'kMpWudeoRTEk$  (1.753 m)     Head Circumference --      Peak Flow --      Pain Score 01/11/21 0834 10     Pain Loc --      Pain Edu? --      Excl. in [place]? --     Constitutional: Alert and oriented.  Uncomfortable appearing but in no acute distress. Eyes: Conjunctivae are normal.  Head: Atraumatic. Nose: No congestion/rhinnorhea. Mouth/Throat: Mucous membranes are moist.   Neck: Normal range of motion.  Cardiovascular: Normal rate, regular rhythm.  Good peripheral circulation. Respiratory: Normal respiratory effort.  No retractions.   Gastrointestinal: No distention.  Musculoskeletal: Extremities warm and well perfused.  Neurologic:  Normal speech and language.  5/5 motor strength and intact sensation to bilateral lower extremities, proximal and distal. Skin:  Skin is warm and dry. No rash noted.  Lumbar surgical scar clean, dry, intact with scab and surrounding <1cm erythema with no induration or abnormal warmth.  No drainage. Psychiatric: Mood and affect are normal. Speech and behavior are normal.  ____________________________________________   LABS (all labs ordered are listed, but only abnormal results are displayed)  Labs Reviewed - No data to display ____________________________________________  EKG   ____________________________________________  RADIOLOGY    ____________________________________________   PROCEDURES  Procedure(s) performed: No  Procedures  Critical Care performed: No ____________________________________________   INITIAL IMPRESSION / ASSESSMENT AND PLAN / ED COURSE  Pertinent labs & imaging results that were available during my care of the patient were reviewed by me and considered in my medical decision making (see chart for details).   45 year old male with PMH as noted above currently getting IV antibiotics via PICC line for MRSA bacteremia and cervical and lumbar spinal infection presents due to malfunctioning PICC line as well as persistent low back pain not relieved by the medications  he was prescribed.  On exam the vital signs are normal.  I reviewed the past medical records in [place].  The patient was last seen by neurosurgery on 12/8 and is scheduled for follow-up after 4 weeks.  He was seen by Dr. Delaine Lame from [place] on 12/20.  The appearance of the surgical wound today is similar to the photograph in the chart from 12/20.  Neurologic exam is normal.  PICC line: The patient has already been seen by the PICC line nurse and Cathflo was administered.   The PICC nurse will reassess the flow and determine if further intervention or replacement of the line is needed.  Pain: There is no evidence on exam that the patient is developing a wound infection, new neurologic deficit, or any other significant postoperative complication.  The patient declined lab work-up.  I will discuss with neurosurgery.  ----------------------------------------- 1:32 PM on 01/11/2021 -----------------------------------------  I consulted Dr. Izora Ribas from neurosurgery who advised that the patient's persistent pain is now unusual for his situation and based on the description of his exam and the wound, would not recommend any other acute intervention.  He advises that we are free to prescribe stronger pain medication if needed, the patient will follow up with neurosurgery in early January.  The PICC nurse has been unable to get the line functioning and recommends an exchange.  I consulted Dr. Kathlene Cote from interventional radiology who advises that he will be able to place a PICC this afternoon.  Dr. Delaine Lame from ID also came to evaluate the patient.  She recommends that while we are waiting for the PICC to give a dose of vancomycin in the ED.  ----------------------------------------- 2:49 PM on 01/11/2021 -----------------------------------------  The patient returned from the PICC line exchange.  He is stable for discharge at this time.  He declined the dose of vancomycin here and would prefer to just take it at home.  He is stable for discharge at this time.  I counseled the patient and his partner again on the recommendations from neurosurgery and ID, follow-up plan, and return precautions, and they expressed understanding.  I will prescribe Percocet 10 mg for improved pain control until the patient is able to follow-up with neurosurgery.  ____________________________________________   FINAL CLINICAL IMPRESSION(S) / ED DIAGNOSES  Final diagnoses:   Occlusion of peripherally inserted central catheter (PICC) line, initial encounter ([place])  Postoperative pain      NEW MEDICATIONS STARTED DURING THIS VISIT:  New Prescriptions   OXYCODONE-ACETAMINOPHEN (PERCOCET) 10-325 MG TABLET    Take 1 tablet by mouth every 6 (six) hours as needed for up to 7 days for pain.     Note:  This document was prepared using Dragon voice recognition software and may include unintentional dictation errors.    Arta Silence, MD 01/11/21 1459    Arta Silence, MD 01/11/21 1500

## 2021-01-11 NOTE — ED Notes (Signed)
Pt at IR

## 2021-01-11 NOTE — Procedures (Signed)
Under fluoroscopic guidance pt's existing 4 French single-lumen right upper extremity PICC was exchanged out for a new  5 French 38 cm single-lumen PICC with tip at the SVC/right atrial junction.  Medications used-none.  No immediate complications. EBL< 2 cc. Ok to use.

## 2021-01-11 NOTE — Progress Notes (Addendum)
Able to pull back 2 ml but no blood return from PICC line after 2 hours of instillation of Cath-flo- RN & ED DR aware.

## 2021-01-20 ENCOUNTER — Ambulatory Visit: Payer: BC Managed Care – PPO | Attending: Infectious Diseases | Admitting: Infectious Diseases

## 2021-01-20 ENCOUNTER — Other Ambulatory Visit: Payer: Self-pay

## 2021-01-20 VITALS — BP 131/86 | HR 78 | Resp 16 | Ht 69.0 in | Wt 158.0 lb

## 2021-01-20 DIAGNOSIS — B9689 Other specified bacterial agents as the cause of diseases classified elsewhere: Secondary | ICD-10-CM | POA: Diagnosis not present

## 2021-01-20 DIAGNOSIS — B9562 Methicillin resistant Staphylococcus aureus infection as the cause of diseases classified elsewhere: Secondary | ICD-10-CM | POA: Insufficient documentation

## 2021-01-20 DIAGNOSIS — G061 Intraspinal abscess and granuloma: Secondary | ICD-10-CM | POA: Insufficient documentation

## 2021-01-20 DIAGNOSIS — L02519 Cutaneous abscess of unspecified hand: Secondary | ICD-10-CM | POA: Insufficient documentation

## 2021-01-20 DIAGNOSIS — R7881 Bacteremia: Secondary | ICD-10-CM | POA: Diagnosis not present

## 2021-01-20 MED ORDER — SULFAMETHOXAZOLE-TRIMETHOPRIM 800-160 MG PO TABS: 1.0000 | ORAL_TABLET | Freq: Two times a day (BID) | ORAL | 1 refills | Status: DC

## 2021-01-20 NOTE — Progress Notes (Signed)
NAME: Caleb Fisher  DOB: 22-Aug-1975  MRN: [id number]  Date/Time: 01/20/2021 10:28 AM  Subjective:  Follow up visit ? Coye Dawood is a 46 y.o. male is here for follow up of MRSA bacteremia and spine infection hospitalization between 11/30/2020 until 12-2/22.  He is here with his wife. 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 TEE was negative for endocarditis which was done on 12/15/2020. Patient initially was treated with double anti-MRSA coverage with vancomycin and ceftaroline.  After 13 days of dual therapy ceftaroline was discontinued and he was kept on vancomycin.  He was discharged home on vancomycin every 8 hour dosing.  The source of the MRSA was from his forearm excoriated wounds.  During the hospitalization he also was anemic dropping almost 5 g of hemoglobin and underwent an upper GI and colonoscopy work-up.  It was thought that you had taken NSAIDs and aspirin a lot as outpatient and gastritis was the culprit.  There was no bleeding ulcers seen. Today is here for follow-up After discharge he has seen neurosurgeon on 12/23/2020 and he also saw GI on 12/28/2020.  He is on proton pump inhibitor 40 mg twice daily for 2 weeks and then it would be reduced to daily. Patient is overall feeling better than before. He continues to have pain at the site of the lumbar surgery which is exquisite he says. He has no fever He has no diarrhea His appetite is better HE had a PICC malfunction and missed 3 days of medicine and so the last day of treatment now in 01/24/21 Following which he will go on Bactrim DS 1 bid for 6 weeks Past medical history Other than MRSA bacteremia and MRSA infection no significant past history Past Surgical History:  Procedure  Laterality Date   COLONOSCOPY N/A 12/16/2020   Procedure: COLONOSCOPY;  Surgeon: Toledo, Boykin Nearing, MD;  Location: [facility] ENDOSCOPY;  Service: Gastroenterology;  Laterality: N/A;   ESOPHAGOGASTRODUODENOSCOPY N/A 12/16/2020   Procedure: ESOPHAGOGASTRODUODENOSCOPY (EGD);  Surgeon: Toledo, Boykin Nearing, MD;  Location: [facility] ENDOSCOPY;  Service: Gastroenterology;  Laterality: N/A;   LUMBAR LAMINECTOMY FOR EPIDURAL ABSCESS N/A 12/10/2020   Procedure: LUMBAR LAMINECTOMY FOR EPIDURAL ABSCESS;  Surgeon: Lucy Chris, MD;  Location: [facility] ORS;  Service: Neurosurgery;  Laterality: N/A;   POSTERIOR CERVICAL LAMINECTOMY FOR EPIDURAL ABSCESS N/A 12/01/2020   Procedure: POSTERIOR CERVICAL LAMINECTOMY FOR EPIDURAL ABSCESS C2-C4;  Surgeon: Venetia Night, MD;  Location: [facility] ORS;  Service: Neurosurgery;  Laterality: N/A;   TEE WITHOUT CARDIOVERSION N/A 12/15/2020   Procedure: TRANSESOPHAGEAL ECHOCARDIOGRAM (TEE);  Surgeon: Armando Reichert, MD;  Location: [facility] ORS;  Service: Cardiovascular;  Laterality: N/A;    Social History   Socioeconomic History   Marital status: Married    Spouse name: Not on file   Number of children: Not on file   Years of education: Not on file   Highest education level: Not on file  Occupational History   Not on file  Tobacco Use   Smoking status: Some Days    Types: Cigarettes   Smokeless tobacco: Not on file  Substance and Sexual Activity   Alcohol use: Yes   Drug use: Not on file   Sexual activity: Not on file  Other Topics Concern   Not on file  Social History Narrative   Not on file  Social Determinants of Health   Financial Resource Strain: Not on file  Food Insecurity: Not on file  Transportation Needs: Not on file  Physical Activity: Not on file  Stress: Not on file  Social Connections: Not on file  Intimate Partner Violence: Not on file    Family history Dad died of sepsis/kidney issues in 2016 Many male members of his family died young No Known  Allergies I? Current Outpatient Medications  Medication Sig Dispense Refill   amLODipine (NORVASC) 10 MG tablet Take 1 tablet by mouth daily.     methocarbamol (ROBAXIN) 500 MG tablet Take 500 mg by mouth 3 (three) times daily.     oxyCODONE (OXY IR/ROXICODONE) 5 MG immediate release tablet Take by mouth.     pantoprazole (PROTONIX) 40 MG tablet Take 1 tablet (40 mg total) by mouth daily. 30 tablet 0   No current facility-administered medications for this visit.     Abtx:  Anti-infectives (From admission, onward)    None       REVIEW OF SYSTEMS:  Const: negative fever, negative chills, some Weight loss Eyes: negative diplopia or visual changes, negative eye pain ENT: negative coryza, negative sore throat Resp: negative cough, hemoptysis, dyspnea Cards: negative for chest pain, palpitations, lower extremity edema GU: negative for frequency, dysuria and hematuria GI: Negative for abdominal pain, diarrhea, bleeding, constipation Skin: Sun damaged skin arms generalized heme: negative for easy bruising and gum/nose bleeding MS: Generalized weakness Lumbar back pain Neurolo:negative for headaches, dizziness, vertigo, memory problems  Psych:  anxiety, depression  Endocrine: negative for thyroid, diabetes Allergy/Immunology- negative for any medication or food allergies ?  Objective:  VITALS:  BP 131/86    Pulse 78    Resp 16    Ht $R'5\' 9"'Xu$  (1.753 m)    Wt 158 lb (71.7 kg)    SpO2 96%    BMI 23.33 kg/m   PHYSICAL EXAM:  General: very sleepy during the visit, cooperative, no distress,    Head: Normocephalic, without obvious abnormality, atraumatic. Eyes: Conjunctivae clear, anicteric sclerae. Pupils are equal ENT not examined because of mask Neck, symmetrical, no adenopathy, thyroid: non tender no carotid bruit and no JVD. Cervical spine surgical scar healed very well   Back: No CVA tenderness. Lumbar area surgical scar has scab surrounding no erythema- 01/20/21   Dec  2022   Lungs: Clear to auscultation bilaterally. No Wheezing or Rhonchi. No rales. Heart: Regular rate and rhythm, no murmur, rub or gallop. Abdomen: Soft,  Extremities: Right PICC site clean Atraumatic, no cyanosis. No edema. No clubbing Skin: Hypopigmented scars over the forearms.  Very small white spots on his forearms Lymph: Cervical, supraclavicular normal. Neurologic: Grossly non-focal Pertinent Labs Lab Results 01/18/21 vanco trough 23 Creatinine 0.75 Hb 9.5 Esr 61 Crp 22 K 3.1    ? Impression/Recommendation ? 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  Vanco level on 01/18/21 is 23- so it will be reduced to $RemoveBe'750mg'MkiRHzyFn$  IV q8 until 01/24/21 PICC will be removed after that He will take bactrim PO BID for 4 -6weeks   Lumbar laminectomy site healing well- scab present K 3.1- asked him to take bananas/tomatoes- will check K next Monday  Leg tremors at night     Discussed the management with the  patient and his wife Follow up 4 weeks __________________________________________________

## 2021-01-20 NOTE — Patient Instructions (Signed)
You are here for follow up of the MRSA infection- you will complete IV vanco on 01/24/21 and PICC will be removed on 01/25/21- the dose of vancomycin is being reduced from 1 gram every 8 hours to 750mg  every 8 hrs as the level is slightly high. After completing vancomycin you will start taking bactrim DS 1 BID for 1 month- I will see you back in 1 month

## 2021-01-26 ENCOUNTER — Telehealth: Payer: Self-pay

## 2021-01-26 NOTE — Telephone Encounter (Signed)
Called to speak to patient regarding PICC line and wife informed me that patient had fallen down 10-12 steps and was found 01/25/21 lying in a pool of blood. She states he had intestinal bleed and now has a colostomy in place as bowl had to be redirected. She states he had 2 surgeries already at [facility] and is in ICU. He has facial reconstruction scheduled for today due to multiple facial fractures. I have notified DR Ravishankar and AHI as he was to have PICC pulled today.

## 2021-02-08 DIAGNOSIS — Z933 Colostomy status: Secondary | ICD-10-CM | POA: Insufficient documentation

## 2021-02-08 DIAGNOSIS — S36529A Contusion of unspecified part of colon, initial encounter: Secondary | ICD-10-CM | POA: Insufficient documentation

## 2021-02-08 DIAGNOSIS — S02402A Zygomatic fracture, unspecified, initial encounter for closed fracture: Secondary | ICD-10-CM | POA: Insufficient documentation

## 2021-02-08 DIAGNOSIS — R1310 Dysphagia, unspecified: Secondary | ICD-10-CM | POA: Insufficient documentation

## 2021-02-08 DIAGNOSIS — S0219XA Other fracture of base of skull, initial encounter for closed fracture: Secondary | ICD-10-CM | POA: Insufficient documentation

## 2021-02-08 DIAGNOSIS — S36039A Unspecified laceration of spleen, initial encounter: Secondary | ICD-10-CM | POA: Insufficient documentation

## 2021-02-08 DIAGNOSIS — S069XAA Unspecified intracranial injury with loss of consciousness status unknown, initial encounter: Secondary | ICD-10-CM | POA: Insufficient documentation

## 2021-02-17 ENCOUNTER — Encounter: Payer: Self-pay | Admitting: Infectious Diseases

## 2021-02-17 ENCOUNTER — Other Ambulatory Visit: Payer: Self-pay

## 2021-02-17 ENCOUNTER — Ambulatory Visit: Payer: BC Managed Care – PPO | Attending: Infectious Diseases | Admitting: Infectious Diseases

## 2021-02-17 VITALS — BP 119/73 | HR 62 | Temp 98.0°F | Ht 68.0 in | Wt 146.0 lb

## 2021-02-17 DIAGNOSIS — G062 Extradural and subdural abscess, unspecified: Secondary | ICD-10-CM | POA: Insufficient documentation

## 2021-02-17 DIAGNOSIS — Z933 Colostomy status: Secondary | ICD-10-CM | POA: Diagnosis not present

## 2021-02-17 DIAGNOSIS — Z8614 Personal history of Methicillin resistant Staphylococcus aureus infection: Secondary | ICD-10-CM | POA: Diagnosis not present

## 2021-02-17 DIAGNOSIS — S0219XD Other fracture of base of skull, subsequent encounter for fracture with routine healing: Secondary | ICD-10-CM | POA: Diagnosis not present

## 2021-02-17 DIAGNOSIS — Z09 Encounter for follow-up examination after completed treatment for conditions other than malignant neoplasm: Secondary | ICD-10-CM | POA: Insufficient documentation

## 2021-02-17 DIAGNOSIS — Z9181 History of falling: Secondary | ICD-10-CM | POA: Diagnosis not present

## 2021-02-17 DIAGNOSIS — W19XXXD Unspecified fall, subsequent encounter: Secondary | ICD-10-CM | POA: Diagnosis not present

## 2021-02-17 DIAGNOSIS — Z792 Long term (current) use of antibiotics: Secondary | ICD-10-CM | POA: Insufficient documentation

## 2021-02-17 NOTE — Patient Instructions (Addendum)
You are here for follow up for the spine infection- you had completed 6 weeks of IV vanco and is now on PO bactrim until 03/25/21- will do labs at the end of the month Take probiotic and Multivitamin- follow up 8 weeks

## 2021-02-17 NOTE — Progress Notes (Signed)
NAME: Caleb Fisher  DOB: 1975/03/22  MRN: [id number]  Date/Time: 02/17/2021 10:25 AM  Subjective:  Follow up visit- here with his wife Caleb Fisher is a 46 y.o. male is here for follow up of MRSA bacteremia and spine infection Since his last visit on 01/21/21 he was hopsitlaized at [facility] with fall and mechanical trauma- between 01/25/21-02/08/21 Left Zygomatico maxillary complex fracture, s/p ORIF 1/11 ?Left Temporal bone Fracture Intramural hematoma descending colon, s/p L hemicolectomy, transverse colostomy 1/10  Grade 2 Splenic capsular Laceration (2cm) -visualized in OR, not seen in imaging Pt is back home  He was initially hospitalizated at [facility]  between 11/30/2020 until 12-2/22. 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 TEE was negative for endocarditis which was done on 12/15/2020. Patient initially was treated with double anti-MRSA coverage with vancomycin and ceftaroline.  After 13 days of dual therapy ceftaroline was discontinued and he was kept on vancomycin.  He was discharged home on vancomycin every 8 hour dosing.  After discharge he has seen neurosurgeon . I saw him last on 01/21/21 and plan was to DC PICC after completion of vanco on 01/25/21 and then start bactrim for 6 more weeks. Pt was found on the concrete basement floor of his house- face plant, in a pool of blood by his son/wife and EMS was called and taken to [facility]  Pt today is feeling better than before Has bodyache No fever Has colostomy  Past Surgical History:  Procedure Laterality Date   COLONOSCOPY N/A 12/16/2020   Procedure: COLONOSCOPY;  Surgeon: Toledo, Benay Pike, MD;  Location: [facility] ENDOSCOPY;  Service: Gastroenterology;  Laterality: N/A;   ESOPHAGOGASTRODUODENOSCOPY N/A  12/16/2020   Procedure: ESOPHAGOGASTRODUODENOSCOPY (EGD);  Surgeon: Toledo, Benay Pike, MD;  Location: [facility] ENDOSCOPY;  Service: Gastroenterology;  Laterality: N/A;   LUMBAR LAMINECTOMY FOR EPIDURAL ABSCESS N/A 12/10/2020   Procedure: LUMBAR LAMINECTOMY FOR EPIDURAL ABSCESS;  Surgeon: Caleb Perla, MD;  Location: [facility] ORS;  Service: Neurosurgery;  Laterality: N/A;   POSTERIOR CERVICAL LAMINECTOMY FOR EPIDURAL ABSCESS N/A 12/01/2020   Procedure: POSTERIOR CERVICAL LAMINECTOMY FOR EPIDURAL ABSCESS C2-C4;  Surgeon: Caleb Maw, MD;  Location: [facility] ORS;  Service: Neurosurgery;  Laterality: N/A;   TEE WITHOUT CARDIOVERSION N/A 12/15/2020   Procedure: TRANSESOPHAGEAL ECHOCARDIOGRAM (TEE);  Surgeon: Caleb Grime, MD;  Location: [facility] ORS;  Service: Cardiovascular;  Laterality: N/A;    Social History   Socioeconomic History   Marital status: Married    Spouse name: Not on file   Number of children: Not on file   Years of education: Not on file   Highest education level: Not on file  Occupational History   Not on file  Tobacco Use   Smoking status: Some Days    Types: Cigarettes   Smokeless tobacco: Not on file  Substance and Sexual Activity   Alcohol use: Yes   Drug use: Not on file   Sexual activity: Not on file  Other Topics Concern   Not on file  Social History Narrative   Not on file   Social Determinants of Health   Financial Resource Strain: Not on file  Food Insecurity: Not on file  Transportation Needs: Not on file  Physical Activity: Not on file  Stress: Not on file  Social Connections: Not on file  Intimate Partner Violence: Not on  file    Family history Dad died of sepsis/kidney issues in 2016 Many male members of his family died young No Known Allergies I? Current Outpatient Medications  Medication Sig Dispense Refill   amLODipine (NORVASC) 10 MG tablet Take 1 tablet by mouth daily.     Aspirin-Salicylamide-Caffeine (BC HEADACHE PO) Take by mouth.      cloNIDine (CATAPRES) 0.2 MG tablet Take 0.2 mg by mouth 3 (three) times daily.     doxazosin (CARDURA) 2 MG tablet Take 1 tablet by mouth daily.     methocarbamol (ROBAXIN) 500 MG tablet Take 500 mg by mouth 3 (three) times daily.     polyethylene glycol (MIRALAX / GLYCOLAX) 17 g packet Take 17 g by mouth daily.     senna (SENOKOT) 8.6 MG tablet Take 1 tablet by mouth daily.     sulfamethoxazole-trimethoprim (BACTRIM DS) 800-160 MG tablet Take 1 tablet by mouth 2 (two) times daily. 60 tablet 1   traZODone (DESYREL) 150 MG tablet Take 0.5 tablets (75 mg total) by mouth at bedtime     oxyCODONE (OXY IR/ROXICODONE) 5 MG immediate release tablet Take by mouth. (Patient not taking: Reported on 02/17/2021)     pantoprazole (PROTONIX) 40 MG tablet Take 1 tablet (40 mg total) by mouth daily. 30 tablet 0   vancomycin IVPB Inject into the vein. (Patient not taking: Reported on 02/17/2021)     No current facility-administered medications for this visit.     Abtx:  Anti-infectives (From admission, onward)    None       REVIEW OF SYSTEMS:  Const: negative fever, negative chills,  Weight loss Eyes: negative diplopia or visual changes, negative eye pain ENT: negative coryza, negative sore throat Has burning tongue Resp: negative cough, hemoptysis, dyspnea Cards: negative for chest pain, palpitations, lower extremity edema GU: negative for frequency, dysuria and hematuria GI: Negative for abdominal pain, diarrhea, bleeding, constipation Skin: Sun damaged skin arms generalized heme: negative for easy bruising and gum/nose bleeding MS: pain all over especially back Neurolo:memory poblem Psych:  anxiety, depression  Endocrine: negative for thyroid, diabetes Allergy/Immunology- negative for any medication or food allergies ?  Objective:  VITALS:  BP 119/73    Pulse 62    Temp 98 F (36.7 C) (Oral)    Ht $R'5\' 8"'qh$  (1.727 m)    Wt 146 lb (66.2 kg)    BMI 22.20 kg/m   PHYSICAL EXAM:  General: very  sleepy during the visit, cooperative, no distress,    Head: Normocephalic, without obvious abnormality, atraumatic. Eyes: Conjunctivae clear, anicteric sclerae. Pupils are equal ENT not examined because of mask Neck, symmetrical, no adenopathy, thyroid: non tender no carotid bruit and no JVD. Cervical spine surgical scar healed very well Back: No CVA tenderness. Lumbar area surgical scar has scab -no erythema  Lungs: Clear to auscultation bilaterally. No Wheezing or Rhonchi. No rales. Heart: Regular rate and rhythm, no murmur, rub or gallop. Abdomen: Soft,  colostomy Atraumatic, no cyanosis. No edema. No clubbing Skin: Hypopigmented scars over the forearms.  Very small white spots on his forearms Lymph: Cervical, supraclavicular normal. Neurologic: Grossly non-focal Pertinent Labs 02/08/21 WBC 9.1 HB 9.1 Plt 562 Cr 0.8 ? Impression/Recommendation ? 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 Now on Bactrim DS 1 BID until 03/25/21  Will check ESR/CRP end of this month and if needed can extend beyond the 10th of March He will follow up with neurosurgery end of this month- will discuss with Dr.Yarbrough regarding repeat imaging   Recent trauma Left Zygomatico maxillary complex fracture, s/p ORIF 1/11  Left Temporal bone Fracture  Intramural hematoma descending colon, s/p L hemicolectomy, transverse colostomy 1/10   Grade 2 Splenic capsular Laceration (2cm)  Will follow up at [facility]     Discussed the management with the patient and his wife Follow up 8 weeks __________________________________________________

## 2021-04-14 ENCOUNTER — Ambulatory Visit: Payer: BC Managed Care – PPO | Admitting: Infectious Diseases

## 2021-04-28 ENCOUNTER — Telehealth: Payer: Self-pay

## 2021-04-28 ENCOUNTER — Other Ambulatory Visit: Payer: Self-pay

## 2021-04-28 ENCOUNTER — Encounter: Payer: Self-pay | Admitting: Emergency Medicine

## 2021-04-28 ENCOUNTER — Emergency Department: Payer: BC Managed Care – PPO

## 2021-04-28 ENCOUNTER — Emergency Department
Admission: EM | Admit: 2021-04-28 | Discharge: 2021-04-28 | Disposition: A | Discharge status: Home / Self Care | Payer: BC Managed Care – PPO | Attending: Emergency Medicine | Admitting: Emergency Medicine

## 2021-04-28 DIAGNOSIS — R509 Fever, unspecified: Secondary | ICD-10-CM | POA: Diagnosis present

## 2021-04-28 DIAGNOSIS — Z20822 Contact with and (suspected) exposure to covid-19: Secondary | ICD-10-CM | POA: Insufficient documentation

## 2021-04-28 DIAGNOSIS — E876 Hypokalemia: Secondary | ICD-10-CM

## 2021-04-28 DIAGNOSIS — J189 Pneumonia, unspecified organism: Secondary | ICD-10-CM

## 2021-04-28 DIAGNOSIS — D72829 Elevated white blood cell count, unspecified: Secondary | ICD-10-CM | POA: Diagnosis not present

## 2021-04-28 DIAGNOSIS — R700 Elevated erythrocyte sedimentation rate: Secondary | ICD-10-CM | POA: Insufficient documentation

## 2021-04-28 DIAGNOSIS — J181 Lobar pneumonia, unspecified organism: Secondary | ICD-10-CM | POA: Insufficient documentation

## 2021-04-28 HISTORY — DX: Other injury of spleen, initial encounter: S36.09XA

## 2021-04-28 LAB — CBC WITH DIFFERENTIAL/PLATELET
Abs Immature Granulocytes: 0.09 10*3/uL — ABNORMAL HIGH (ref 0.00–0.07)
Basophils Absolute: 0.1 10*3/uL (ref 0.0–0.1)
Basophils Relative: 0 %
Eosinophils Absolute: 0 10*3/uL (ref 0.0–0.5)
Eosinophils Relative: 0 %
HCT: 33.9 % — ABNORMAL LOW (ref 39.0–52.0)
Hemoglobin: 10.8 g/dL — ABNORMAL LOW (ref 13.0–17.0)
Immature Granulocytes: 1 %
Lymphocytes Relative: 5 %
Lymphs Abs: 0.9 10*3/uL (ref 0.7–4.0)
MCH: 27.5 pg (ref 26.0–34.0)
MCHC: 31.9 g/dL (ref 30.0–36.0)
MCV: 86.3 fL (ref 80.0–100.0)
Monocytes Absolute: 0.8 10*3/uL (ref 0.1–1.0)
Monocytes Relative: 5 %
Neutro Abs: 14.8 10*3/uL — ABNORMAL HIGH (ref 1.7–7.7)
Neutrophils Relative %: 89 %
Platelets: 320 10*3/uL (ref 150–400)
RBC: 3.93 MIL/uL — ABNORMAL LOW (ref 4.22–5.81)
RDW: 17.9 % — ABNORMAL HIGH (ref 11.5–15.5)
WBC: 16.6 10*3/uL — ABNORMAL HIGH (ref 4.0–10.5)
nRBC: 0 % (ref 0.0–0.2)

## 2021-04-28 LAB — URINALYSIS, COMPLETE (UACMP) WITH MICROSCOPIC
Bilirubin Urine: NEGATIVE
Glucose, UA: NEGATIVE mg/dL
Hgb urine dipstick: NEGATIVE
Ketones, ur: NEGATIVE mg/dL
Leukocytes,Ua: NEGATIVE
Nitrite: NEGATIVE
Protein, ur: NEGATIVE mg/dL
Specific Gravity, Urine: 1.011 (ref 1.005–1.030)
pH: 5 (ref 5.0–8.0)

## 2021-04-28 LAB — COMPREHENSIVE METABOLIC PANEL
ALT: 10 U/L (ref 0–44)
AST: 19 U/L (ref 15–41)
Albumin: 3.7 g/dL (ref 3.5–5.0)
Alkaline Phosphatase: 138 U/L — ABNORMAL HIGH (ref 38–126)
Anion gap: 11 (ref 5–15)
BUN: 13 mg/dL (ref 6–20)
CO2: 27 mmol/L (ref 22–32)
Calcium: 8.7 mg/dL — ABNORMAL LOW (ref 8.9–10.3)
Chloride: 98 mmol/L (ref 98–111)
Creatinine, Ser: 1.22 mg/dL (ref 0.61–1.24)
GFR, Estimated: >60 mL/min (ref >60)
Glucose, Bld: 133 mg/dL — ABNORMAL HIGH (ref 70–99)
Potassium: 2.5 mmol/L — CL (ref 3.5–5.1)
Sodium: 136 mmol/L (ref 135–145)
Total Bilirubin: 0.7 mg/dL (ref 0.3–1.2)
Total Protein: 7.4 g/dL (ref 6.5–8.1)

## 2021-04-28 LAB — PROCALCITONIN: Procalcitonin: 0.71 ng/mL

## 2021-04-28 LAB — RESP PANEL BY RT-PCR (FLU A&B, COVID) ARPGX2
Influenza A by PCR: NEGATIVE
Influenza B by PCR: NEGATIVE
SARS Coronavirus 2 by RT PCR: NEGATIVE

## 2021-04-28 LAB — SEDIMENTATION RATE: Sed Rate: 29 mm/hr — ABNORMAL HIGH (ref 0–15)

## 2021-04-28 LAB — C-REACTIVE PROTEIN: CRP: 4.4 mg/dL — ABNORMAL HIGH (ref <1.0)

## 2021-04-28 LAB — LACTIC ACID, PLASMA
Lactic Acid, Venous: 2.5 mmol/L (ref 0.5–1.9)
Lactic Acid, Venous: 1.2 mmol/L (ref 0.5–1.9)

## 2021-04-28 LAB — MAGNESIUM: Magnesium: 2 mg/dL (ref 1.7–2.4)

## 2021-04-28 MED ORDER — VANCOMYCIN HCL 1500 MG/300ML IV SOLN
1500.0000 mg | Freq: Once | INTRAVENOUS | Status: AC
  Administered 2021-04-28: 1500 mg via INTRAVENOUS
  Filled 2021-04-28: qty 300

## 2021-04-28 MED ORDER — LACTATED RINGERS IV BOLUS
1000.0000 mL | Freq: Once | INTRAVENOUS | Status: AC
  Administered 2021-04-28: 1000 mL via INTRAVENOUS

## 2021-04-28 MED ORDER — DOXYCYCLINE HYCLATE 100 MG PO CAPS: 100.0000 mg | ORAL_CAPSULE | Freq: Two times a day (BID) | ORAL | 0 refills | Status: AC

## 2021-04-28 MED ORDER — POTASSIUM CHLORIDE CRYS ER 20 MEQ PO TBCR
80.0000 meq | EXTENDED_RELEASE_TABLET | Freq: Once | ORAL | Status: AC
  Administered 2021-04-28: 80 meq via ORAL
  Filled 2021-04-28: qty 4

## 2021-04-28 MED ORDER — POTASSIUM CHLORIDE CRYS ER 20 MEQ PO TBCR: 20.0000 meq | EXTENDED_RELEASE_TABLET | Freq: Two times a day (BID) | ORAL | 0 refills | Status: DC

## 2021-04-28 MED ORDER — SODIUM CHLORIDE 0.9 % IV SOLN
1.0000 g | Freq: Once | INTRAVENOUS | Status: AC
  Administered 2021-04-28: 1 g via INTRAVENOUS
  Filled 2021-04-28: qty 10

## 2021-04-28 MED ORDER — AMOXICILLIN-POT CLAVULANATE 875-125 MG PO TABS: 1.0000 | ORAL_TABLET | Freq: Two times a day (BID) | ORAL | 0 refills | Status: AC

## 2021-04-28 NOTE — Telephone Encounter (Signed)
Wife called to report patient has fever 102.9 since last night. Requesting oral antibiotics be started to hold him until they see surgeon on 05/09/21. Also has follow up with Ostomy clinic on 05/16/21. Instructed to await call back from triage or provider. Patient would like antibiotic to be sen tto [facility] in [place].Call back-[phone]. ?

## 2021-04-28 NOTE — ED Notes (Signed)
Pt ambulated by EDP in room ?

## 2021-04-28 NOTE — ED Notes (Signed)
Pt's 2nd IV infiltrated, pt's vanc taking a long time due to pt bending his arm frequently. Pt states he does not want another IV. EDP aware, will give 2nd antibiotic after vanc is complete.  ?

## 2021-04-28 NOTE — ED Notes (Signed)
Pt eating, tolerating well, will dc after 2nd antibiotic complete ?

## 2021-04-28 NOTE — ED Triage Notes (Signed)
Pt comes into the ED via POV c/o fever that spiked this morning.  Pt states overall he isnt feeling "well", but denies any exact problems.  Pt denies any respiratory problems.  Pt has h/o spinal abscesses back in November that caused sepsis in 2022.  Pt denies any current back pain outside of his chronic pain.  Pt ambulatory at this time with even and unlabored respirations.  ?

## 2021-04-28 NOTE — Consult Note (Signed)
PHARMACY -  BRIEF ANTIBIOTIC NOTE  ? ?Pharmacy has received consult(s) for Vancomycin from an ED provider.  The patient's profile has been reviewed for ht/wt/allergies/indication/available labs.   ? ?One time order(s) placed for Vancomycin 1.5g IV x 1 dose. ? ?Further antibiotics/pharmacy consults should be ordered by admitting physician if indicated.       ?                ?Thank you, ?Rahkeem Senft A Jams Trickett ?04/28/2021  5:40 PM ? ?

## 2021-04-28 NOTE — Telephone Encounter (Signed)
Patient wife returned call - patient will be heading to [facility] ED in 40-45 minutes. ? ? ?Laurie Penado P Zian Mohamed, CMA ? ?

## 2021-04-28 NOTE — ED Notes (Signed)
Patient transported to X-ray 

## 2021-04-28 NOTE — ED Provider Notes (Signed)
? ?[facility] ?Provider Note ? ? ? Event Date/Time  ? First MD Initiated Contact with Patient 04/28/21 1616   ?  (approximate) ? ? ?History  ? ?Fever ? ? ?HPI ? ?Kameryn Tisdel is a 46 y.o. male with a past medical history of MRSA bacteremia, infection of the cervical lumbar spine, and lumbar epidural abscesses status post cervical and lumbar laminectomies as well as hospitalization at [place] earlier this year in January after a fall (sustaining eft Zygomatico maxillary complex fracture, s/p ORIF 1/11, Left Temporal bone Fracture, Intramural hematoma descending colon, s/p L hemicolectomy, transverse colostomy 1/10, Grade 2 Splenic capsular Laceration (2cm) not currently on antibiotics who presents accompanied by spouse after referred to emergency room by ID for evaluation of fever measured at 102 earlier this morning.  This is the first measured fever they have had in a while.  Patient denies any new symptoms associated with this including cough, change in his chronic back pain, shortness of breath, abdominal pain, nausea, vomiting, diarrhea, burning with urination, change in chronic paresthesias in his left upper extremity or any change in chronic neck pain, any new weakness, numbness, incontinence or falls or injuries.  He denies EtOH use or illicit drug use.  He has been taking Goody powders for pain.  He has an ostomy and he has not noticed any significant change although slightly looser which spouse attributes to MiraLAX and Senokot.  No other acute concerns at this time.  No recent travel or insect bites.  Aware. ? ?  ?Past Medical History:  ?Diagnosis Date  ? Ruptured spleen   ? ? ? ?Physical Exam  ?Triage Vital Signs: ?ED Triage Vitals  ?Enc Vitals Group  ?   BP 04/28/21 1611 117/80  ?   Pulse Rate 04/28/21 1611 78  ?   Resp 04/28/21 1611 18  ?   Temp 04/28/21 1611 97.8 ?F (36.6 ?C)  ?   Temp Source 04/28/21 1611 Oral  ?   SpO2 04/28/21 1611 94 %  ?   Weight 04/28/21 1608 145 lb 15.1 oz  (66.2 kg)  ?   Height 04/28/21 1608 $RemoveBefor'5\' 8"'oXQzcQzradNs$  (1.727 m)  ?   Head Circumference --   ?   Peak Flow --   ?   Pain Score 04/28/21 1607 8  ?   Pain Loc --   ?   Pain Edu? --   ?   Excl. in [place]? --   ? ? ?Most recent vital signs: ?Vitals:  ? 04/28/21 1934 04/28/21 2000  ?BP:  134/77  ?Pulse:  84  ?Resp:  19  ?Temp: 99.4 ?F (37.4 ?C)   ?SpO2:  97%  ? ? ?General: Awake, chronically ill-appearing. ?CV:  Prolonged capillary refill in the digits.  2+ radial pulses.  No murmur. ?Resp:  Normal effort.  Clear bilaterally. ?Abd:  No distention.  Soft.  Ostomy is in place without surrounding skin changes. ?Other:  .  Surgical wounds scarring appears clean without any significant erythema induration fluctuance or point tenderness along the back.  Patient has symmetric strength in his upper and lower extremities.  His sensation is intact to all extremities.  Oropharynx is very dry but otherwise no abscess exudates or other abnormalities noted. ? ? ?ED Results / Procedures / Treatments  ?Labs ?(all labs ordered are listed, but only abnormal results are displayed) ?Labs Reviewed  ?COMPREHENSIVE METABOLIC PANEL - Abnormal; Notable for the following components:  ?    Result Value  ? Potassium  2.5 (*)   ? Glucose, Bld 133 (*)   ? Calcium 8.7 (*)   ? Alkaline Phosphatase 138 (*)   ? All other components within normal limits  ?CBC WITH DIFFERENTIAL/PLATELET - Abnormal; Notable for the following components:  ? WBC 16.6 (*)   ? RBC 3.93 (*)   ? Hemoglobin 10.8 (*)   ? HCT 33.9 (*)   ? RDW 17.9 (*)   ? Neutro Abs 14.8 (*)   ? Abs Immature Granulocytes 0.09 (*)   ? All other components within normal limits  ?LACTIC ACID, PLASMA - Abnormal; Notable for the following components:  ? Lactic Acid, Venous 2.5 (*)   ? All other components within normal limits  ?URINALYSIS, COMPLETE (UACMP) WITH MICROSCOPIC - Abnormal; Notable for the following components:  ? Color, Urine YELLOW (*)   ? APPearance CLEAR (*)   ? Bacteria, UA FEW (*)   ? All other  components within normal limits  ?SEDIMENTATION RATE - Abnormal; Notable for the following components:  ? Sed Rate 29 (*)   ? All other components within normal limits  ?RESP PANEL BY RT-PCR (FLU A&B, COVID) [id number]  ?CULTURE, BLOOD (ROUTINE X 2)  ?CULTURE, BLOOD (ROUTINE X 2)  ?LACTIC ACID, PLASMA  ?PROCALCITONIN  ?MAGNESIUM  ?C-REACTIVE PROTEIN  ?STREP PNEUMONIAE URINARY ANTIGEN  ?LEGIONELLA PNEUMOPHILA SEROGP 1 UR AG  ? ? ? ?EKG ? ? ? ?RADIOLOGY ? ?Chest x-ray shows some left lower lobe consolidation consistent with pneumonia without any other clear focal consolidations, effusion, edema, pneumothorax or other clear acute thoracic process.  I also reviewed radiologist interpretation. ? ?PROCEDURES: ? ?Critical Care performed: No ? ?Procedures ? ?The patient is on the cardiac monitor to evaluate for evidence of arrhythmia and/or significant heart rate changes. ? ? ?MEDICATIONS ORDERED IN ED: ?Medications  ?cefTRIAXone (ROCEPHIN) 1 g in sodium chloride 0.9 % 100 mL IVPB (has no administration in time range)  ?lactated ringers bolus 1,000 mL (0 mLs Intravenous Stopped 04/28/21 1930)  ?lactated ringers bolus 1,000 mL (0 mLs Intravenous Stopped 04/28/21 1930)  ?vancomycin (VANCOREADY) IVPB 1500 mg/300 mL (1,500 mg Intravenous New Bag/Given 04/28/21 1804)  ?potassium chloride SA (KLOR-CON M) CR tablet 80 mEq (80 mEq Oral Given 04/28/21 1804)  ? ? ? ?IMPRESSION / MDM / ASSESSMENT AND PLAN / ED COURSE  ?I reviewed the triage vital signs and the nursing notes. ?             ?               ? ?Differential diagnosis includes, but is not limited to bacteremia from possible recurrent bacteremia epidural abscess or endocarditis, pneumonia, cystitis versus other occult source.  No obvious cellulitis pharyngitis otitis on exam. ? ?Chest x-ray shows some left lower lobe consolidation consistent with pneumonia without any other clear focal consolidations, effusion, edema, pneumothorax or other clear acute thoracic process.  I also  reviewed radiologist interpretation. ? ?CMP is remarkable for potassium of 2.5 without any other significant electrolyte or metabolic derangements.  CBC with WBC count of 16.6 with hemoglobin of 10.8 compared to 8.84 months ago and normal platelets.  Procalcitonin today 0.71.  Magnesium is 2.  Initial lactic acid downtrended from 2.5-1.2 after some IV fluids.  ESR slight elevated at 29.  COVID influenza PCR negative. ? ?I suspect patient symptoms are likely secondary to pneumonia seen on x-ray.  He does have a leukocytosis and had lactic acid just above upper limit normal he otherwise does not appear  septic.  Discussed with on-call infectious disease specialist Dr. Delaine Lame who agreed with community-acquired pneumonia treatment and recommended sending strep urine antigen and Legionella studies which were sent.  Patient can follow-up with her.  Patient is comfortable with this and had no evidence of respiratory failure on troponin relation in his room.  I did offer admission given his extensive medical history associated hyperkalemia but he states he strongly wished to be discharged home and follow-up outpatient.  We will also prescribe supplemental potassium and have him check or check this at his follow-up visit.  He has no other acute concerns at this time.  Discharged in stable condition. ? ?  ? ? ?FINAL CLINICAL IMPRESSION(S) / ED DIAGNOSES  ? ?Final diagnoses:  ?Pneumonia of left lower lobe due to infectious organism  ?Hypokalemia  ? ? ? ?Rx / DC Orders  ? ?ED Discharge Orders   ? ?      Ordered  ?  amoxicillin-clavulanate (AUGMENTIN) 875-125 MG tablet  2 times daily       ? 04/28/21 2010  ?  doxycycline (VIBRAMYCIN) 100 MG capsule  2 times daily       ? 04/28/21 2010  ?  potassium chloride SA (KLOR-CON M) 20 MEQ tablet  2 times daily       ? 04/28/21 2023  ? ?  ?  ? ?  ? ? ? ?Note:  This document was prepared using Dragon voice recognition software and may include unintentional dictation errors. ?  ?Lucrezia Starch, MD ?04/28/21 2026 ? ?

## 2021-04-28 NOTE — Telephone Encounter (Signed)
Informed patient wife Tina Temme of Dr.Ravishankar response - Caren Griffins stated she would have to call patient and inform him that he would need to go to the ED to be assessed. I asked that she call me back if patient agrees to go.  ? ? ?Churchill Grimsley P Laurelin Elson, CMA ? ?

## 2021-04-29 LAB — STREP PNEUMONIAE URINARY ANTIGEN: Strep Pneumo Urinary Antigen: NEGATIVE

## 2021-05-02 LAB — LEGIONELLA PNEUMOPHILA SEROGP 1 UR AG: L. pneumophila Serogp 1 Ur Ag: NEGATIVE

## 2021-05-03 ENCOUNTER — Encounter: Payer: Self-pay | Admitting: Infectious Diseases

## 2021-05-03 ENCOUNTER — Ambulatory Visit: Payer: BC Managed Care – PPO | Attending: Infectious Diseases | Admitting: Infectious Diseases

## 2021-05-03 ENCOUNTER — Other Ambulatory Visit
Admission: RE | Admit: 2021-05-03 | Discharge: 2021-05-03 | Disposition: A | Discharge status: Home / Self Care | Payer: BC Managed Care – PPO | Source: Ambulatory Visit | Attending: Infectious Diseases | Admitting: Infectious Diseases

## 2021-05-03 VITALS — BP 152/91 | HR 106 | Temp 98.1°F | Resp 93

## 2021-05-03 DIAGNOSIS — S36030A Superficial (capsular) laceration of spleen, initial encounter: Secondary | ICD-10-CM | POA: Diagnosis not present

## 2021-05-03 DIAGNOSIS — Z933 Colostomy status: Secondary | ICD-10-CM | POA: Diagnosis not present

## 2021-05-03 DIAGNOSIS — R42 Dizziness and giddiness: Secondary | ICD-10-CM | POA: Insufficient documentation

## 2021-05-03 DIAGNOSIS — Z8614 Personal history of Methicillin resistant Staphylococcus aureus infection: Secondary | ICD-10-CM | POA: Diagnosis not present

## 2021-05-03 DIAGNOSIS — A4902 Methicillin resistant Staphylococcus aureus infection, unspecified site: Secondary | ICD-10-CM | POA: Diagnosis not present

## 2021-05-03 DIAGNOSIS — R5383 Other fatigue: Secondary | ICD-10-CM | POA: Diagnosis present

## 2021-05-03 DIAGNOSIS — G062 Extradural and subdural abscess, unspecified: Secondary | ICD-10-CM | POA: Insufficient documentation

## 2021-05-03 DIAGNOSIS — J181 Lobar pneumonia, unspecified organism: Secondary | ICD-10-CM | POA: Insufficient documentation

## 2021-05-03 DIAGNOSIS — R531 Weakness: Secondary | ICD-10-CM | POA: Insufficient documentation

## 2021-05-03 DIAGNOSIS — Z792 Long term (current) use of antibiotics: Secondary | ICD-10-CM | POA: Diagnosis not present

## 2021-05-03 DIAGNOSIS — Z9049 Acquired absence of other specified parts of digestive tract: Secondary | ICD-10-CM | POA: Diagnosis not present

## 2021-05-03 DIAGNOSIS — E876 Hypokalemia: Secondary | ICD-10-CM | POA: Insufficient documentation

## 2021-05-03 LAB — COMPREHENSIVE METABOLIC PANEL
ALT: 10 U/L (ref 0–44)
AST: 17 U/L (ref 15–41)
Albumin: 3.5 g/dL (ref 3.5–5.0)
Alkaline Phosphatase: 94 U/L (ref 38–126)
Anion gap: 11 (ref 5–15)
BUN: 9 mg/dL (ref 6–20)
CO2: 26 mmol/L (ref 22–32)
Calcium: 9.2 mg/dL (ref 8.9–10.3)
Chloride: 104 mmol/L (ref 98–111)
Creatinine, Ser: 0.64 mg/dL (ref 0.61–1.24)
GFR, Estimated: >60 mL/min (ref >60)
Glucose, Bld: 115 mg/dL — ABNORMAL HIGH (ref 70–99)
Potassium: 3 mmol/L — ABNORMAL LOW (ref 3.5–5.1)
Sodium: 141 mmol/L (ref 135–145)
Total Bilirubin: 0.4 mg/dL (ref 0.3–1.2)
Total Protein: 6.8 g/dL (ref 6.5–8.1)

## 2021-05-03 LAB — CBC WITH DIFFERENTIAL/PLATELET
Abs Immature Granulocytes: 0.05 10*3/uL (ref 0.00–0.07)
Basophils Absolute: 0 10*3/uL (ref 0.0–0.1)
Basophils Relative: 1 %
Eosinophils Absolute: 0 10*3/uL (ref 0.0–0.5)
Eosinophils Relative: 0 %
HCT: 34.4 % — ABNORMAL LOW (ref 39.0–52.0)
Hemoglobin: 11.2 g/dL — ABNORMAL LOW (ref 13.0–17.0)
Immature Granulocytes: 1 %
Lymphocytes Relative: 20 %
Lymphs Abs: 1.8 10*3/uL (ref 0.7–4.0)
MCH: 26.9 pg (ref 26.0–34.0)
MCHC: 32.6 g/dL (ref 30.0–36.0)
MCV: 82.5 fL (ref 80.0–100.0)
Monocytes Absolute: 0.5 10*3/uL (ref 0.1–1.0)
Monocytes Relative: 5 %
Neutro Abs: 6.4 10*3/uL (ref 1.7–7.7)
Neutrophils Relative %: 73 %
Platelets: 282 10*3/uL (ref 150–400)
RBC: 4.17 MIL/uL — ABNORMAL LOW (ref 4.22–5.81)
RDW: 16.9 % — ABNORMAL HIGH (ref 11.5–15.5)
WBC: 8.8 10*3/uL (ref 4.0–10.5)
nRBC: 0 % (ref 0.0–0.2)

## 2021-05-03 LAB — CULTURE, BLOOD (ROUTINE X 2)
Culture: NO GROWTH
Culture: NO GROWTH
Special Requests: ADEQUATE

## 2021-05-03 LAB — MAGNESIUM: Magnesium: 2.1 mg/dL (ref 1.7–2.4)

## 2021-05-03 LAB — SEDIMENTATION RATE: Sed Rate: 32 mm/hr — ABNORMAL HIGH (ref 0–15)

## 2021-05-03 MED ORDER — POTASSIUM CHLORIDE CRYS ER 20 MEQ PO TBCR: 40.0000 meq | EXTENDED_RELEASE_TABLET | Freq: Two times a day (BID) | ORAL | 0 refills | Status: DC

## 2021-05-03 NOTE — Patient Instructions (Addendum)
You are here for follow up- you recently has been treated for pneumonia- today you are weak and dizzy- will check K/Mg- you dont want to go back to ED- if your symptoms get worse please go to ED. Other wise please see your PCP. ?The MRSA infection you had last year has been completely treated  ? ?

## 2021-05-03 NOTE — Progress Notes (Signed)
NAME: Caleb Fisher  ?DOB: Jun 29, 1975  ?MRN: [id number]  ?Date/Time: 05/03/2021 10:20 AM ? ?Subjective:  ? ? ?Pt here for follow up with his wife ?HE was in ED on 4/13 with fever of 102 and diagnosed with left lower lobe pneumonia and given augmentin and Doxy. His K was 2.5, and he was given K= today he feels very weak and dizzy ?His temperature has been around 99. ?He has a cough ?Trying to bring up sputum ? ?Has complicated past medical history ?11/30/2020 until 12-2/22. 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 ?TEE was negative for endocarditis which was done on 12/15/2020. HE took 8 weeks of IV antibiotic followed PO bactrim for another 8 weeks ? ? 01/25/21-02/08/21 Left Zygomatico maxillary complex fracture, s/p ORIF 1/11 ?Left Temporal bone Fracture ?Intramural hematoma descending colon, s/p L hemicolectomy, transverse colostomy 1/10  ?Grade 2 Splenic capsular Laceration (2cm) ?-visualized in OR, not seen in imaging ? dosing.   ? ?Past Surgical History:  ?Procedure Laterality Date  ? COLONOSCOPY N/A 12/16/2020  ? Procedure: COLONOSCOPY;  Surgeon: Toledo, Benay Pike, MD;  Location: [facility];  Service: Gastroenterology;  Laterality: N/A;  ? ESOPHAGOGASTRODUODENOSCOPY N/A 12/16/2020  ? Procedure: ESOPHAGOGASTRODUODENOSCOPY (EGD);  Surgeon: Toledo, Benay Pike, MD;  Location: [facility];  Service: Gastroenterology;  Laterality: N/A;  ? FACIAL RECONSTRUCTION SURGERY    ? LUMBAR LAMINECTOMY FOR EPIDURAL ABSCESS N/A 12/10/2020  ? Procedure: LUMBAR LAMINECTOMY FOR EPIDURAL ABSCESS;  Surgeon: Deetta Perla, MD;  Location: [facility];  Service: Neurosurgery;  Laterality: N/A;  ? OSTOMY    ? POSTERIOR CERVICAL LAMINECTOMY FOR EPIDURAL ABSCESS N/A 12/01/2020  ? Procedure:  POSTERIOR CERVICAL LAMINECTOMY FOR EPIDURAL ABSCESS C2-C4;  Surgeon: Meade Maw, MD;  Location: [facility];  Service: Neurosurgery;  Laterality: N/A;  ? TEE WITHOUT CARDIOVERSION N/A 12/15/2020  ? Procedure: TRANSESOPHAGEAL ECHOCARDIOGRAM (TEE);  Surgeon: Andrez Grime, MD;  Location: [facility];  Service: Cardiovascular;  Laterality: N/A;  ?  ?Social History  ? ?Socioeconomic History  ? Marital status: Married  ?  Spouse name: Not on file  ? Number of children: Not on file  ? Years of education: Not on file  ? Highest education level: Not on file  ?Occupational History  ? Not on file  ?Tobacco Use  ? Smoking status: Some Days  ?  Types: Cigarettes  ? Smokeless tobacco: Not on file  ?Substance and Sexual Activity  ? Alcohol use: Yes  ? Drug use: Not on file  ? Sexual activity: Not on file  ?Other Topics Concern  ? Not on file  ?Social History Narrative  ? Not on file  ? ?Social Determinants of Health  ? ?Financial Resource Strain: Not on file  ?Food Insecurity: Not on file  ?Transportation Needs: Not on file  ?Physical Activity: Not on file  ?Stress: Not on file  ?Social Connections: Not on file  ?Intimate Partner Violence: Not on file  ?  ?Family history ?Dad died of sepsis/kidney issues in 2016 ?Many male members of his family died young ?No Known Allergies ?I? ?Current Outpatient Medications  ?Medication Sig Dispense Refill  ? amLODipine (NORVASC) 10 MG tablet Take 1 tablet by mouth daily.    ? amoxicillin-clavulanate (AUGMENTIN) 875-125 MG tablet Take 1 tablet by mouth 2 (two) times daily for 7 days. 14 tablet 0  ?  Aspirin-Salicylamide-Caffeine (BC HEADACHE PO) Take by mouth.    ? cloNIDine (CATAPRES) 0.2 MG tablet Take 0.2 mg by mouth 3 (three) times daily.    ? doxazosin (CARDURA) 2 MG tablet Take 1 tablet by mouth daily.    ? doxycycline (VIBRAMYCIN) 100 MG capsule Take 1 capsule (100 mg total) by mouth 2 (two) times daily for 7 days. 14 capsule 0  ? methocarbamol (ROBAXIN) 500 MG tablet Take 500 mg  by mouth 3 (three) times daily.    ? polyethylene glycol (MIRALAX / GLYCOLAX) 17 g packet Take 17 g by mouth daily.    ? senna (SENOKOT) 8.6 MG tablet Take 1 tablet by mouth daily.    ? traZODone (DESYREL) 150 MG tablet Take 0.5 tablets (75 mg total) by mouth at bedtime    ? pantoprazole (PROTONIX) 40 MG tablet Take 1 tablet (40 mg total) by mouth daily. 30 tablet 0  ? potassium chloride SA (KLOR-CON M) 20 MEQ tablet Take 1 tablet (20 mEq total) by mouth 2 (two) times daily for 3 days. 6 tablet 0  ? ?No current facility-administered medications for this visit.  ?  ? ?Abtx:  ?Anti-infectives (From admission, onward)  ? ? None  ? ?  ? ? ?REVIEW OF SYSTEMS:  ?Const: Low-grade fever, negative chills, some weight loss ?Eyes: negative diplopia or visual changes, negative eye pain ?ENT: negative coryza, negative sore throat ?Has burning tongue ?Resp: Has cough ?Cards: negative for chest pain, palpitations, lower extremity edema ?GU: negative for frequency, dysuria and hematuria ?GI: Negative for abdominal pain, diarrhea, bleeding, constipation ?Skin: Sun damaged skin arms generalized heme: negative for easy bruising and gum/nose bleeding ?MS: Generalized fatigue and weakness ?Neurolo: Dizzy ?Psych:  anxiety, depression  ?Endocrine: negative for thyroid, diabetes ?Allergy/Immunology- negative for any medication or food allergies ?? ? ?Objective:  ?VITALS:  ?BP (!) 152/91 (Patient Position: Supine)   Pulse (!) 106   Temp 98.1 ?F (36.7 ?C) (Temporal)   Resp (!) 93   ?PHYSICAL EXAM:  ?General: Says he is very fatigued.  Lying in bed.  Chronically ill looking ?Head: Normocephalic, without obvious abnormality, atraumatic. ?Eyes: Conjunctivae clear, anicteric sclerae. Pupils are equal ?ENT not examined because of mask ?Neck, symmetrical, no adenopathy, thyroid: non tender ?no carotid bruit and no JVD. ?Cervical spine surgical scar healed very well ?Back: No CVA tenderness. ?Lumbar area surgical scar has healed  completely ?Lungs: Clear to auscultation bilaterally. No Wheezing or Rhonchi. No rales. ?Heart: Regular rate and rhythm, no murmur, rub or gallop. ?Abdomen: Soft,  ?colostomy ?Atraumatic, no cyanosis. No edema. No clubbing ?Skin: Sun damaged skin over the arms ?Lymph: Cervical, supraclavicular normal. ?Neurologic: Grossly non-focal ?Pertinent Labs ?02/08/21 ?WBC 9.1 ?HB 9.1 ?Plt 562 ?Cr 0.8 ?? ?Impression/Recommendation ? ?Recent left lower lobe pneumonia.  On Doxy plus amoxicillin and improving ?Complains of severe weakness fatigue and dizziness  ? ?Hypokalemia.  Could be causing the weakness.  He was given potassium recently in the ED but has missed a few doses.  We will recheck potassium and magnesium today and he will have to continue with the potassium.  He will have to follow-up with his PCP. ?? 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  And completed another 8 weeks of p.o. Bactrim ?States the back pain is  much better ?  ?Trauma in Jan 2023 leading to  ?Left Zygomatico maxillary complex fracture, s/p ORIF 1/11  ?Left Temporal bone Fracture ? ?Intramural hematoma descending colon, s/p L hemicolectomy, transverse colostomy 1/10  ? ?Grade 2 Splenic capsular Laceration (2cm).  Followed at [facility] ? ?Discussed the management with the patient and the wife. ?Currently there is no unresolved MRSA infection.  Follow as needed. ? ?He needs to follow-up with his PCP. ? ? ?Repeat  K 3 ?Called wife and spoke to her- sent prescription for 37mq BID  for 3-5 days ?Also spoke to his PCP Dr.Bliss and updated her on the labs and she will follow up with repeat lab next week ?

## 2021-12-03 ENCOUNTER — Ambulatory Visit
Admission: EM | Admit: 2021-12-03 | Discharge: 2021-12-03 | Disposition: A | Discharge status: Home / Self Care | Payer: BC Managed Care – PPO | Attending: Family Medicine | Admitting: Family Medicine

## 2021-12-03 DIAGNOSIS — R5382 Chronic fatigue, unspecified: Secondary | ICD-10-CM | POA: Insufficient documentation

## 2021-12-03 DIAGNOSIS — E876 Hypokalemia: Secondary | ICD-10-CM | POA: Diagnosis not present

## 2021-12-03 LAB — BASIC METABOLIC PANEL
Anion gap: 5 (ref 5–15)
BUN: 22 mg/dL — ABNORMAL HIGH (ref 6–20)
CO2: 30 mmol/L (ref 22–32)
Calcium: 8.9 mg/dL (ref 8.9–10.3)
Chloride: 103 mmol/L (ref 98–111)
Creatinine, Ser: 0.98 mg/dL (ref 0.61–1.24)
GFR, Estimated: >60 mL/min (ref >60)
Glucose, Bld: 102 mg/dL — ABNORMAL HIGH (ref 70–99)
Potassium: 3.4 mmol/L — ABNORMAL LOW (ref 3.5–5.1)
Sodium: 138 mmol/L (ref 135–145)

## 2021-12-03 LAB — CBC WITH DIFFERENTIAL/PLATELET
Abs Immature Granulocytes: 0.02 10*3/uL (ref 0.00–0.07)
Basophils Absolute: 0.1 10*3/uL (ref 0.0–0.1)
Basophils Relative: 1 %
Eosinophils Absolute: 0.4 10*3/uL (ref 0.0–0.5)
Eosinophils Relative: 3 %
HCT: 33.9 % — ABNORMAL LOW (ref 39.0–52.0)
Hemoglobin: 11 g/dL — ABNORMAL LOW (ref 13.0–17.0)
Immature Granulocytes: 0 %
Lymphocytes Relative: 15 %
Lymphs Abs: 1.7 10*3/uL (ref 0.7–4.0)
MCH: 28 pg (ref 26.0–34.0)
MCHC: 32.4 g/dL (ref 30.0–36.0)
MCV: 86.3 fL (ref 80.0–100.0)
Monocytes Absolute: 0.7 10*3/uL (ref 0.1–1.0)
Monocytes Relative: 6 %
Neutro Abs: 8.3 10*3/uL — ABNORMAL HIGH (ref 1.7–7.7)
Neutrophils Relative %: 75 %
Platelets: 223 10*3/uL (ref 150–400)
RBC: 3.93 MIL/uL — ABNORMAL LOW (ref 4.22–5.81)
RDW: 15.2 % (ref 11.5–15.5)
WBC: 11.1 10*3/uL — ABNORMAL HIGH (ref 4.0–10.5)
nRBC: 0 % (ref 0.0–0.2)

## 2021-12-03 MED ORDER — POTASSIUM CHLORIDE CRYS ER 20 MEQ PO TBCR: 40.0000 meq | EXTENDED_RELEASE_TABLET | Freq: Once | ORAL | Status: DC

## 2021-12-03 NOTE — ED Triage Notes (Signed)
Pt has a severe episode of fatigue. X1 month. Pt has a long history due to fall and hitting his head.

## 2021-12-03 NOTE — ED Provider Notes (Signed)
[facility]    CSN: [id number] Arrival date & time: 12/03/21  1417      History   Chief Complaint Chief Complaint  Patient presents with   Fatigue    X1 month Severe fatigue    HPI Caleb Fisher is a 46 y.o. male.   HPI History provided by the patient and his wife.  Caleb Fisher presents for fatigue for the past month or two. At work, they sent him home early a couple of times for a 2 weeks. As long as he is up and moving he is okay but for the last year his body has been "through pure hell."    He has trouble falling asleep at night. He is having trouble at work he was not able to stay away. He was septic last year and had neck and back surgery. Had to do IV antibiotics. Jan 10th he was found unconscious at the bottom of the stairs. He had fallen and had a TBI with a brain bleed. Since, he has had a lot of issues being able to keep his potassium, iron, Mg and hemoglobin at normal levels. No one has been able to tell him why.     He follows with trauma surgery and was last seen in [place] who wanted him to be out of work for a couple more months but Caleb Fisher couldn't afford that and he was only out 2 weeks.   Last night, his supervisor recommended if he could get medication to stay awake to stay awake. He has trouble staying awake at work and falls asleep as soon as he gets home. He doesn't pass out.  He would like to start stimulants.  Denies chest pain, shortness of breath, new extremity weakness, ongoing headache.    He missed work on Friday and his wife stayed home with him.  Requesting a work note so they will not lose their job.  Past Medical History:  Diagnosis Date   Ruptured spleen     Patient Active Problem List   Diagnosis Date Noted   Troponin I above reference range    Iron deficiency anemia due to chronic blood loss 12/15/2020   Pulmonary nodules/lesions, multiple 12/15/2020   Acute respiratory failure with hypoxia (HCC)    Epidural abscess     Transaminitis    Abnormal LFTs    Acute hypercapnic respiratory failure ([place])    Fall    Acute urinary retention 12/07/2020   MRSA bacteremia 12/01/2020   Abscess in epidural space of cervical spine 12/01/2020   Hypertensive urgency 12/01/2020   Hypokalemia 12/01/2020   Thrombocytopenia ([place]) 55/97/4163   Acute metabolic encephalopathy 84/53/6468   Severe sepsis ([place]) 12/01/2020   Altered mental status    Encephalopathy acute 11/30/2020    Past Surgical History:  Procedure Laterality Date   COLONOSCOPY N/A 12/16/2020   Procedure: COLONOSCOPY;  Surgeon: Toledo, Benay Pike, MD;  Location: [facility];  Service: Gastroenterology;  Laterality: N/A;   ESOPHAGOGASTRODUODENOSCOPY N/A 12/16/2020   Procedure: ESOPHAGOGASTRODUODENOSCOPY (EGD);  Surgeon: Toledo, Benay Pike, MD;  Location: [facility];  Service: Gastroenterology;  Laterality: N/A;   FACIAL RECONSTRUCTION SURGERY     LUMBAR LAMINECTOMY FOR EPIDURAL ABSCESS N/A 12/10/2020   Procedure: LUMBAR LAMINECTOMY FOR EPIDURAL ABSCESS;  Surgeon: Deetta Perla, MD;  Location: [facility];  Service: Neurosurgery;  Laterality: N/A;   OSTOMY     POSTERIOR CERVICAL LAMINECTOMY FOR EPIDURAL ABSCESS N/A 12/01/2020   Procedure: POSTERIOR CERVICAL LAMINECTOMY FOR EPIDURAL ABSCESS C2-C4;  Surgeon: Meade Maw,  MD;  Location: [facility];  Service: Neurosurgery;  Laterality: N/A;   TEE WITHOUT CARDIOVERSION N/A 12/15/2020   Procedure: TRANSESOPHAGEAL ECHOCARDIOGRAM (TEE);  Surgeon: Andrez Grime, MD;  Location: [facility];  Service: Cardiovascular;  Laterality: N/A;       Home Medications    Prior to Admission medications   Medication Sig Start Date End Date Taking? Authorizing Provider  amLODipine (NORVASC) 10 MG tablet Take 1 tablet by mouth daily. 01/18/21  Yes [provider]  Aspirin-Salicylamide-Caffeine (BC HEADACHE PO) Take by mouth.   Yes [provider]  cloNIDine (CATAPRES) 0.2 MG tablet Take 0.2 mg by mouth 3  (three) times daily. 02/08/21  Yes [provider]  doxazosin (CARDURA) 2 MG tablet Take 1 tablet by mouth daily. 02/09/21  Yes [provider]  methocarbamol (ROBAXIN) 500 MG tablet Take 500 mg by mouth 3 (three) times daily. 01/15/21  Yes [provider]  polyethylene glycol (MIRALAX / GLYCOLAX) 17 g packet Take 17 g by mouth daily.   Yes [provider]  senna (SENOKOT) 8.6 MG tablet Take 1 tablet by mouth daily.   Yes [provider]  pantoprazole (PROTONIX) 40 MG tablet Take 1 tablet (40 mg total) by mouth daily. 12/17/20 01/20/21  Max Sane, MD  potassium chloride SA (KLOR-CON M) 20 MEQ tablet Take 2 tablets (40 mEq total) by mouth 2 (two) times daily for 5 days. 05/03/21 05/08/21  Tsosie Billing, MD  traZODone (DESYREL) 150 MG tablet Take 0.5 tablets (75 mg total) by mouth at bedtime 02/08/21   [provider]    Family History History reviewed. No pertinent family history.  Social History Social History   Tobacco Use   Smoking status: Some Days    Types: Cigarettes  Substance Use Topics   Alcohol use: Yes    Comment: occ     Allergies   Patient has no known allergies.   Review of Systems Review of Systems : negative unless otherwise stated in HPI.      Physical Exam Triage Vital Signs ED Triage Vitals  Enc Vitals Group     BP 12/03/21 1459 (!) 143/89     Pulse Rate 12/03/21 1459 60     Resp 12/03/21 1459 16     Temp 12/03/21 1459 98.1 F (36.7 C)     Temp Source 12/03/21 1459 Oral     SpO2 12/03/21 1459 100 %     Weight 12/03/21 1505 150 lb (68 kg)     Height 12/03/21 1505 '5\' 8"'$  (1.727 m)     Head Circumference --      Peak Flow --      Pain Score 12/03/21 1504 0     Pain Loc --      Pain Edu? --      Excl. in [place]? --    No data found.  Updated Vital Signs BP (!) 143/89 (BP Location: Left Arm)   Pulse 60   Temp 98.1 F (36.7 C) (Oral)   Resp 16   Ht '5\' 8"'$  (1.727 m)   Wt 68 kg   SpO2 100%    BMI 22.81 kg/m   Visual Acuity Right Eye Distance:   Left Eye Distance:   Bilateral Distance:    Right Eye Near:   Left Eye Near:    Bilateral Near:     Physical Exam  GEN: alert, chronically ill appearing male HENT:  mucus membranes moist, oropharyngeal without lesions or erythema,  nares patent, no  nasal discharge  EYES:   pupils equal and reactive, EOM intact NECK:  supple, normal ROM, no thyromegaly RESP:  clear to auscultation bilaterally, no increased work of breathing  CVS:   regular rate and rhythm, distal pulses intact   EXT:    atraumatic, no edema  NEURO: Cranial nerves II through XII grossly intact, slowed speech, alert and oriented at baseline per wife Skin:   warm and dry, no rash  UC Treatments / Results  Labs (all labs ordered are listed, but only abnormal results are displayed) Labs Reviewed  BASIC METABOLIC PANEL - Abnormal; Notable for the following components:      Result Value   Potassium 3.4 (*)    Glucose, Bld 102 (*)    BUN 22 (*)    All other components within normal limits  CBC WITH DIFFERENTIAL/PLATELET - Abnormal; Notable for the following components:   WBC 11.1 (*)    RBC 3.93 (*)    Hemoglobin 11.0 (*)    HCT 33.9 (*)    Neutro Abs 8.3 (*)    All other components within normal limits    EKG   Radiology No results found.  Procedures Procedures (including critical care time)  Medications Ordered in UC Medications - No data to display  Initial Impression / Assessment and Plan / UC Course  I have reviewed the triage vital signs and the nursing notes.  Pertinent labs & imaging results that were available during my care of the patient were reviewed by me and considered in my medical decision making (see chart for details).     Patient is a 46 year old male who presents with his wife for excessive fatigue.  They note that he falls asleep both at work and at home.  He is mildly hypertensive but is satting well on room air.  He is  afebrile.  He has history of altered mental status, severe sepsis, hypokalemia, thrombocytopenia, urinary retention, hypercapnic respiratory failure with hypoxia, metabolic encephalopathy, MRSA bacteremia.  In reviewing his medications he takes trazodone and Robaxin both which can cause some drowsiness.  Advised him to acutely stop these medications.  He also takes clonidine which may make some people sleepy.  Advised him to follow-up with his PCP about titrating this down.  On image review he has history of a epidural abscess seen on cervical, thoracic and lumbar MRI in November 2022.  There was concern at that time for possible osteomyelitis at T2 and C4-C5.  He was seen by Dr. Cari Caraway, neurosurgery and infectious disease, Dr. Steva Ready, for the epidural abscess (status postlaminectomy) secondary to MRSA bacteremia.  He had a PICC line for several weeks.  He was hospitalized at [facility] for fall and zygomaticomaxillary complex fracture in January of this year.  He had a slight elevation in his white blood cell count at 11.1 with  mild left shift.  His normocytic anemia is stable at 11.0 from 11.2.  He has a very mild hypokalemia, potassium 3.4.   Discussed lab work with patient and his wife.  Work note provided for both of them.  Advised that he follow-up with the neurosurgeon and have his lab work repeated in 1 week.  Wife and patient voiced understanding.  Reviewed reasons of why I cannot start a stimulant in the urgent care setting.  If he would like to further explore this he should start with his PCP.  Reviewed that there are some medication changes that he can make in the meantime.  Return and ED  precautions given and they voiced understanding. Discussed MDM, treatment plan and plan for follow-up with patient/wife who agree with plan.      Final Clinical Impressions(s) / UC Diagnoses   Final diagnoses:  Chronic fatigue  Hypokalemia     Discharge Instructions      Follow-up with  primary care doctor or one of your surgeons to discuss starting a stimulant.  Someone will call you to get a new primary care doctor.   You are taking several medications that can cause sleepiness.  The Robaxin and trazodone are the 2 main offenders.  I recommend you stopping these medications.  Talk with your doctors about stopping the clonidine this should not be stopped abruptly.  Your potassium is mildly low.  See handout on potassium rich foods.  Your anemia is stable. There is a slight elevation in your white blood cells.  You should have this rechecked in the next week.         ED Prescriptions   None    PDMP not reviewed this encounter.   Lyndee Hensen, DO 12/06/21 2353

## 2021-12-03 NOTE — Discharge Instructions (Addendum)
Follow-up with primary care doctor or one of your surgeons to discuss starting a stimulant.  Someone will call you to get a new primary care doctor.   You are taking several medications that can cause sleepiness.  The Robaxin and trazodone are the 2 main offenders.  I recommend you stopping these medications.  Talk with your doctors about stopping the clonidine this should not be stopped abruptly.  Your potassium is mildly low.  See handout on potassium rich foods.  Your anemia is stable. There is a slight elevation in your white blood cells.  You should have this rechecked in the next week.

## 2022-02-09 ENCOUNTER — Ambulatory Visit: Payer: BC Managed Care – PPO | Admitting: Family Medicine

## 2022-02-09 ENCOUNTER — Encounter: Payer: Self-pay | Admitting: Family Medicine

## 2022-02-09 VITALS — BP 140/90 | HR 88 | Ht 68.0 in | Wt 150.0 lb

## 2022-02-09 DIAGNOSIS — R7989 Other specified abnormal findings of blood chemistry: Secondary | ICD-10-CM

## 2022-02-09 DIAGNOSIS — G8929 Other chronic pain: Secondary | ICD-10-CM | POA: Diagnosis not present

## 2022-02-09 DIAGNOSIS — E876 Hypokalemia: Secondary | ICD-10-CM

## 2022-02-09 DIAGNOSIS — I10 Essential (primary) hypertension: Secondary | ICD-10-CM

## 2022-02-09 DIAGNOSIS — Z13220 Encounter for screening for lipoid disorders: Secondary | ICD-10-CM

## 2022-02-09 DIAGNOSIS — Z1159 Encounter for screening for other viral diseases: Secondary | ICD-10-CM

## 2022-02-09 DIAGNOSIS — F32A Depression, unspecified: Secondary | ICD-10-CM

## 2022-02-09 DIAGNOSIS — R918 Other nonspecific abnormal finding of lung field: Secondary | ICD-10-CM

## 2022-02-09 DIAGNOSIS — Z720 Tobacco use: Secondary | ICD-10-CM

## 2022-02-09 DIAGNOSIS — G4719 Other hypersomnia: Secondary | ICD-10-CM | POA: Diagnosis not present

## 2022-02-09 DIAGNOSIS — Z114 Encounter for screening for human immunodeficiency virus [HIV]: Secondary | ICD-10-CM

## 2022-02-09 DIAGNOSIS — I160 Hypertensive urgency: Secondary | ICD-10-CM

## 2022-02-09 DIAGNOSIS — E559 Vitamin D deficiency, unspecified: Secondary | ICD-10-CM

## 2022-02-09 DIAGNOSIS — D696 Thrombocytopenia, unspecified: Secondary | ICD-10-CM

## 2022-02-09 DIAGNOSIS — F419 Anxiety disorder, unspecified: Secondary | ICD-10-CM

## 2022-02-09 MED ORDER — CLONIDINE HCL 0.2 MG PO TABS: 0.2000 mg | ORAL_TABLET | Freq: Two times a day (BID) | ORAL | 1 refills | Status: AC

## 2022-02-09 MED ORDER — LIDOCAINE 5 % EX PTCH: 1.0000 | MEDICATED_PATCH | Freq: Two times a day (BID) | CUTANEOUS | 2 refills | Status: AC

## 2022-02-09 MED ORDER — AMLODIPINE BESYLATE 10 MG PO TABS: 10.0000 mg | ORAL_TABLET | Freq: Every day | ORAL | 0 refills | Status: AC

## 2022-02-09 MED ORDER — DOXAZOSIN MESYLATE 2 MG PO TABS: 2.0000 mg | ORAL_TABLET | Freq: Every day | ORAL | 0 refills | Status: AC

## 2022-02-09 MED ORDER — DULOXETINE HCL 30 MG PO CPEP: ORAL_CAPSULE | ORAL | 0 refills | Status: AC

## 2022-02-09 NOTE — Patient Instructions (Addendum)
-  Coordinator will contact you to schedule follow-ups with multiple specialists - Obtain labs - Take blood pressure medication - Take new duloxetine (Cymbalta) and follow Rx instructions - Trial lidocaine patch - Return in 6 weeks - Contact for questions

## 2022-02-10 DIAGNOSIS — G8929 Other chronic pain: Secondary | ICD-10-CM | POA: Insufficient documentation

## 2022-02-10 NOTE — Assessment & Plan Note (Signed)
See additional assessment(s) for plan details. 

## 2022-02-10 NOTE — Progress Notes (Signed)
Primary Care / Sports Medicine Office Visit  Patient Information:  Patient ID: Caleb Fisher, male DOB: 1975-04-14 Age: 47 y.o. MRN: [id number]   Caleb Fisher is a pleasant 47 y.o. male presenting with the following:  Chief Complaint  Patient presents with   Establish Care    Vitals:   02/09/22 0936  BP: (!) 140/90  Pulse: 88  SpO2: 96%   Vitals:   02/09/22 0936  Weight: 150 lb (68 kg)  Height: '5\' 8"'$  (1.727 m)   Body mass index is 22.81 kg/m.  No results found.   Independent interpretation of notes and tests performed by another provider:   None  Procedures performed:   None  Pertinent History, Exam, Impression, and Recommendations:   Reade was seen today for establish care.  Other chronic pain Assessment & Plan: In the setting of lumbosacral degenerative disease. Patient requesting opioid analgesics. Given significant past medical history I discussed the significant risks that the medication category carries given that there is ongoing evaluation and uncontrolled medical conditions. Plan for duloxetine course, trial of lidoderm patch and referral to pain management.  Orders: -     Ambulatory referral to Pain Clinic -     DULoxetine HCl; Take 1 capsule (30 mg total) by mouth every evening for 7 days, THEN 2 capsules (60 mg total) every evening.  Dispense: 87 capsule; Refill: 0  Excessive daytime sleepiness Assessment & Plan: See additional assessment(s) for plan details. Referral to pulmonary sleep medicine placed.  Orders: -     Ambulatory referral to Pulmonology  Tobacco abuse Assessment & Plan: See additional assessment(s) for plan details.  Orders: -     Ambulatory referral to Pulmonology  Need for hepatitis C screening test -     Hepatitis C antibody  Screening for HIV (human immunodeficiency virus) -     HIV Antibody (routine testing w rflx)  Screening for lipoid disorders -     Apo A1 + B + Ratio -     Comprehensive metabolic panel -      Lipid panel  Thrombocytopenia (HCC) -     CBC -     Hepatitis C antibody -     HIV Antibody (routine testing w rflx)  Hypokalemia Assessment & Plan: Noted on previous serial labs, recheck ordered today.  Orders: -     Comprehensive metabolic panel  Abnormal LFTs -     Apo A1 + B + Ratio -     Comprehensive metabolic panel -     Hepatitis C antibody -     HIV Antibody (routine testing w rflx) -     Lipid panel  Hypertensive urgency Overview:    02/09/2022    9:36 AM 12/03/2021    3:05 PM 12/03/2021    2:59 PM  Vitals with BMI  Height '5\' 8"'$  '5\' 8"'$    Weight  150 lbs   BMI  40.08   Systolic   676  Diastolic   89  Pulse   60     Assessment & Plan: Chronic, uncontrolled in the setting of multiple comorbidities and complex medical history with ongoing workup/management.  Patient states that he has been out of his medications.  He denies any active cardiopulmonary complaints.  Examination without JVD, positive S1-S2, regular rate and rhythm, lung fields are clear to auscultation bilaterally without overt wheezes, rales, rhonchi.  Given patient's proceeding significant medical history, ongoing issues, multimodality approach discussed with patient, referral placed to cardiology among other  specialists.  Risk stratification labs ordered, medication refilled and close follow-up scheduled.  Orders: -     Ambulatory referral to Cardiology -     Apo A1 + B + Ratio -     CBC -     Comprehensive metabolic panel -     Lipid panel  Vitamin D deficiency -     VITAMIN D 25 Hydroxy (Vit-D Deficiency, Fractures)  Anxiety and depression -     Ambulatory referral to Psychiatry -     DULoxetine HCl; Take 1 capsule (30 mg total) by mouth every evening for 7 days, THEN 2 capsules (60 mg total) every evening.  Dispense: 87 capsule; Refill: 0  Hypertension, unspecified type Overview:    02/09/2022    9:36 AM 12/03/2021    3:05 PM 12/03/2021    2:59 PM  Vitals with BMI  Height '5\' 8"'$   '5\' 8"'$    Weight  150 lbs   BMI  05.39   Systolic   767  Diastolic   89  Pulse   60     Assessment & Plan: Chronic, uncontrolled in the setting of multiple comorbidities and complex medical history with ongoing workup/management.  Patient states that he has been out of his medications.  He denies any active cardiopulmonary complaints.  Examination without JVD, positive S1-S2, regular rate and rhythm, lung fields are clear to auscultation bilaterally without overt wheezes, rales, rhonchi.  Given patient's proceeding significant medical history, ongoing issues, multimodality approach discussed with patient, referral placed to cardiology among other specialists.  Risk stratification labs ordered, medication refilled and close follow-up scheduled.  Orders: -     amLODIPine Besylate; Take 1 tablet (10 mg total) by mouth daily.  Dispense: 90 tablet; Refill: 0 -     cloNIDine HCl; Take 1 tablet (0.2 mg total) by mouth 2 (two) times daily.  Dispense: 60 tablet; Refill: 1 -     Doxazosin Mesylate; Take 1 tablet (2 mg total) by mouth daily.  Dispense: 90 tablet; Refill: 0  Pulmonary nodules/lesions, multiple Assessment & Plan: Previously documented from 12/2020 timeframe with concern for septic emboli, ongoing smoker with multiple comorbid medical conditions.  To address this, in conjunction with significant daytime fatigue and restless sleep, referral to pulmonary sleep medicine placed for further evaluation and management.  We will follow peripherally on this issue.   Other orders -     Lidocaine; Place 1 patch onto the skin every 12 (twelve) hours. Remove & Discard patch within 12 hours or as directed by MD  Dispense: 30 patch; Refill: 2   I provided a total time of 65 minutes including both face-to-face and non-face-to-face time on 02/10/2022 inclusive of time utilized for medical chart review, information gathering, care coordination with staff, and documentation completion.   Orders &  Medications Meds ordered this encounter  Medications   DULoxetine (CYMBALTA) 30 MG capsule    Sig: Take 1 capsule (30 mg total) by mouth every evening for 7 days, THEN 2 capsules (60 mg total) every evening.    Dispense:  87 capsule    Refill:  0   amLODipine (NORVASC) 10 MG tablet    Sig: Take 1 tablet (10 mg total) by mouth daily.    Dispense:  90 tablet    Refill:  0   cloNIDine (CATAPRES) 0.2 MG tablet    Sig: Take 1 tablet (0.2 mg total) by mouth 2 (two) times daily.    Dispense:  60 tablet    Refill:  1   doxazosin (CARDURA) 2 MG tablet    Sig: Take 1 tablet (2 mg total) by mouth daily.    Dispense:  90 tablet    Refill:  0   lidocaine (LIDODERM) 5 %    Sig: Place 1 patch onto the skin every 12 (twelve) hours. Remove & Discard patch within 12 hours or as directed by MD    Dispense:  30 patch    Refill:  2   Orders Placed This Encounter  Procedures   Apo A1 + B + Ratio   CBC   Comprehensive metabolic panel   Hepatitis C antibody   HIV Antibody (routine testing w rflx)   Lipid panel   VITAMIN D 25 Hydroxy (Vit-D Deficiency, Fractures)   Ambulatory referral to Cardiology   Ambulatory referral to Pulmonology   Ambulatory referral to Pain Clinic   Ambulatory referral to Psychiatry     Return in about 6 weeks (around 03/23/2022).     Montel Culver, MD, [facility]   Primary Care Sports Medicine Primary Care and Sports Medicine at [facility]

## 2022-02-10 NOTE — Assessment & Plan Note (Signed)
Previously documented from 12/2020 timeframe with concern for septic emboli, ongoing smoker with multiple comorbid medical conditions.  To address this, in conjunction with significant daytime fatigue and restless sleep, referral to pulmonary sleep medicine placed for further evaluation and management.  We will follow peripherally on this issue.

## 2022-02-10 NOTE — Assessment & Plan Note (Signed)
In the setting of lumbosacral degenerative disease. Patient requesting opioid analgesics. Given significant past medical history I discussed the significant risks that the medication category carries given that there is ongoing evaluation and uncontrolled medical conditions. Plan for duloxetine course, trial of lidoderm patch and referral to pain management.

## 2022-02-10 NOTE — Assessment & Plan Note (Addendum)
Chronic, uncontrolled in the setting of multiple comorbidities and complex medical history with ongoing workup/management.  Patient states that he has been out of his medications.  He denies any active cardiopulmonary complaints.  Examination without JVD, positive S1-S2, regular rate and rhythm, lung fields are clear to auscultation bilaterally without overt wheezes, rales, rhonchi.  Given patient's proceeding significant medical history, ongoing issues, multimodality approach discussed with patient, referral placed to cardiology among other specialists.  Risk stratification labs ordered, medication refilled and close follow-up scheduled.

## 2022-02-10 NOTE — Assessment & Plan Note (Signed)
Noted on previous serial labs, recheck ordered today.

## 2022-02-10 NOTE — Assessment & Plan Note (Signed)
See additional assessment(s) for plan details. Referral to pulmonary sleep medicine placed.

## 2022-02-16 ENCOUNTER — Encounter: Payer: Self-pay | Admitting: Cardiology

## 2022-02-16 ENCOUNTER — Ambulatory Visit: Payer: BC Managed Care – PPO | Attending: Cardiology | Admitting: Cardiology

## 2022-03-11 NOTE — Progress Notes (Deleted)
03/14/22- 89 yoM Smoker for sleep evaluation with concern of chronic daytime sleepiness Medical problem list includes HTN, Lung Nodules, Dysphagia, Epidural Abscess, Traumatic Brain Injury, Thrombocytopenia, Hx MRSA Bacteremia/ Sepsis, hx Ruptured Spleen,  Colostomy, Tobacco Abuse,  Epworth score- Body weight today- Covid vax- Flu vax-

## 2022-03-14 ENCOUNTER — Institutional Professional Consult (permissible substitution): Payer: BC Managed Care – PPO | Admitting: Internal Medicine

## 2022-03-23 ENCOUNTER — Ambulatory Visit: Payer: BC Managed Care – PPO | Admitting: Family Medicine

## 2023-03-25 IMAGING — DX DG CHEST 1V PORT
2 series · 2 of 2 positions shown · non-contrast
Comparison: 12/02/2020

CLINICAL DATA: Pt having issues with Picc line for 1.5 weeks.
Unable to draw blood.

EXAM:
PORTABLE CHEST - 1 VIEW

[chest ap (1 of 2)]
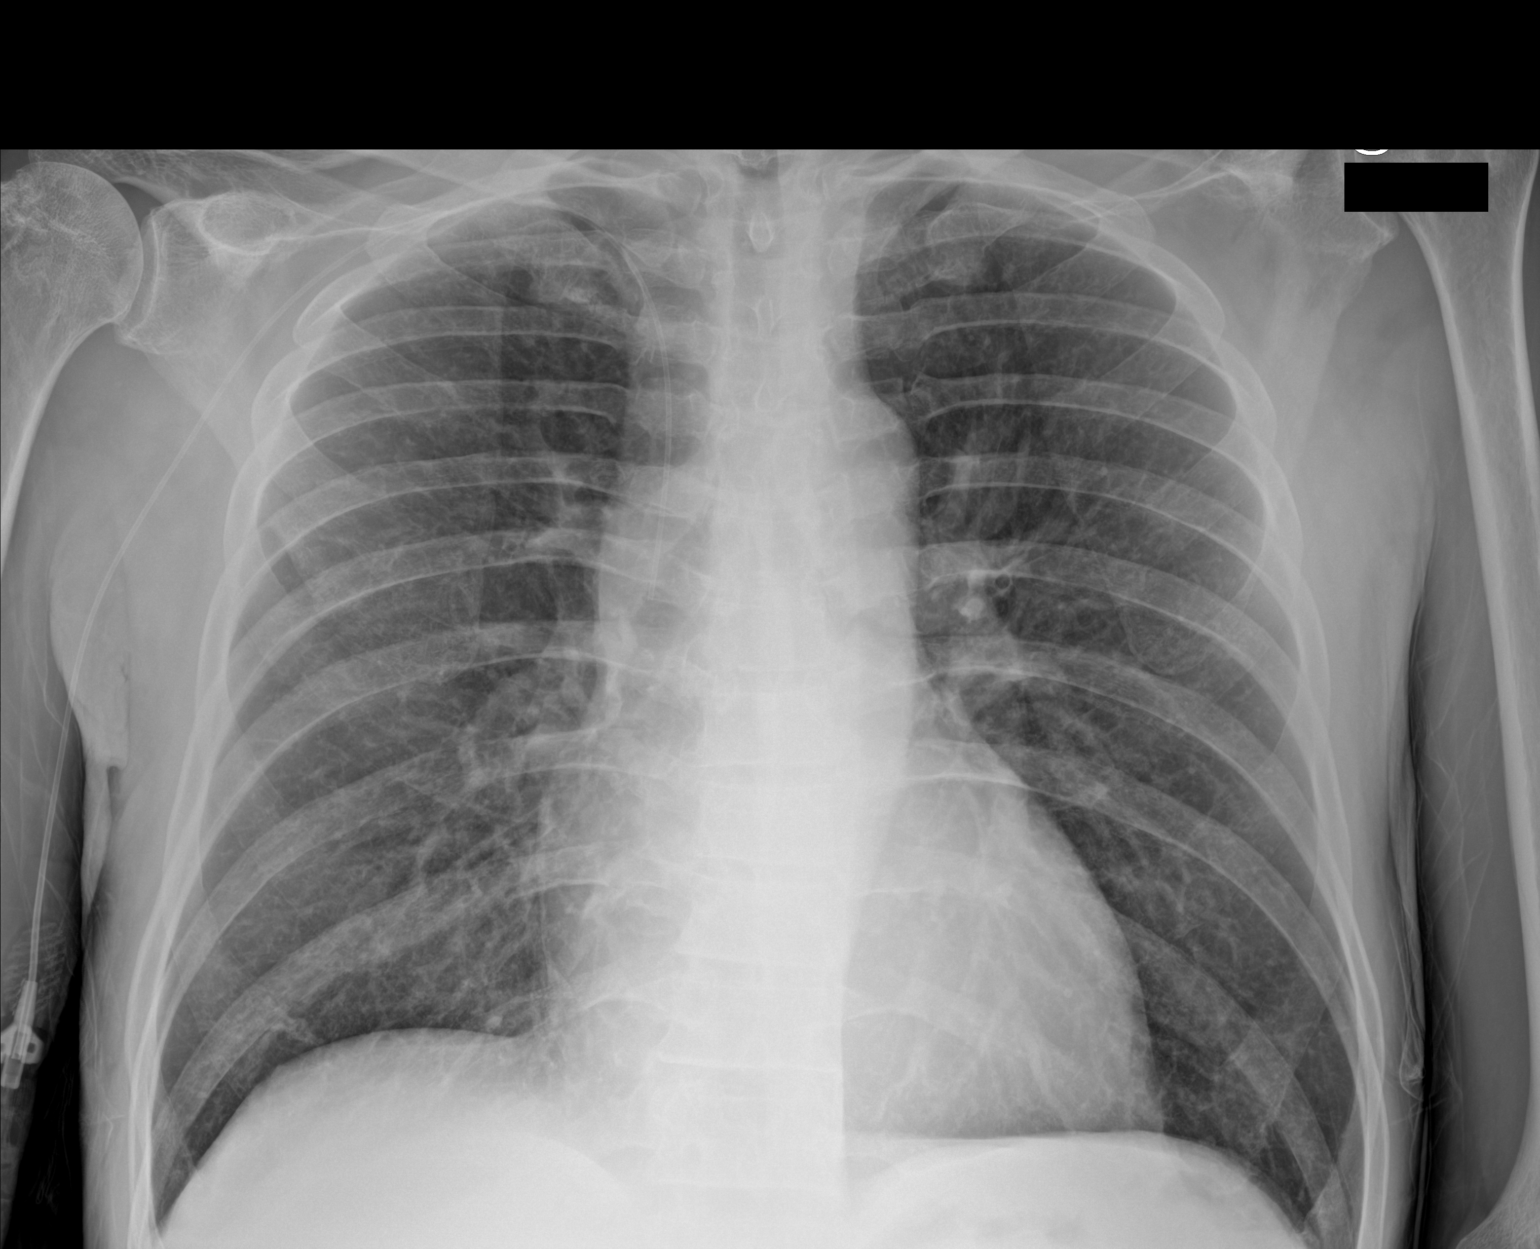

[chest ap (2 of 2)]
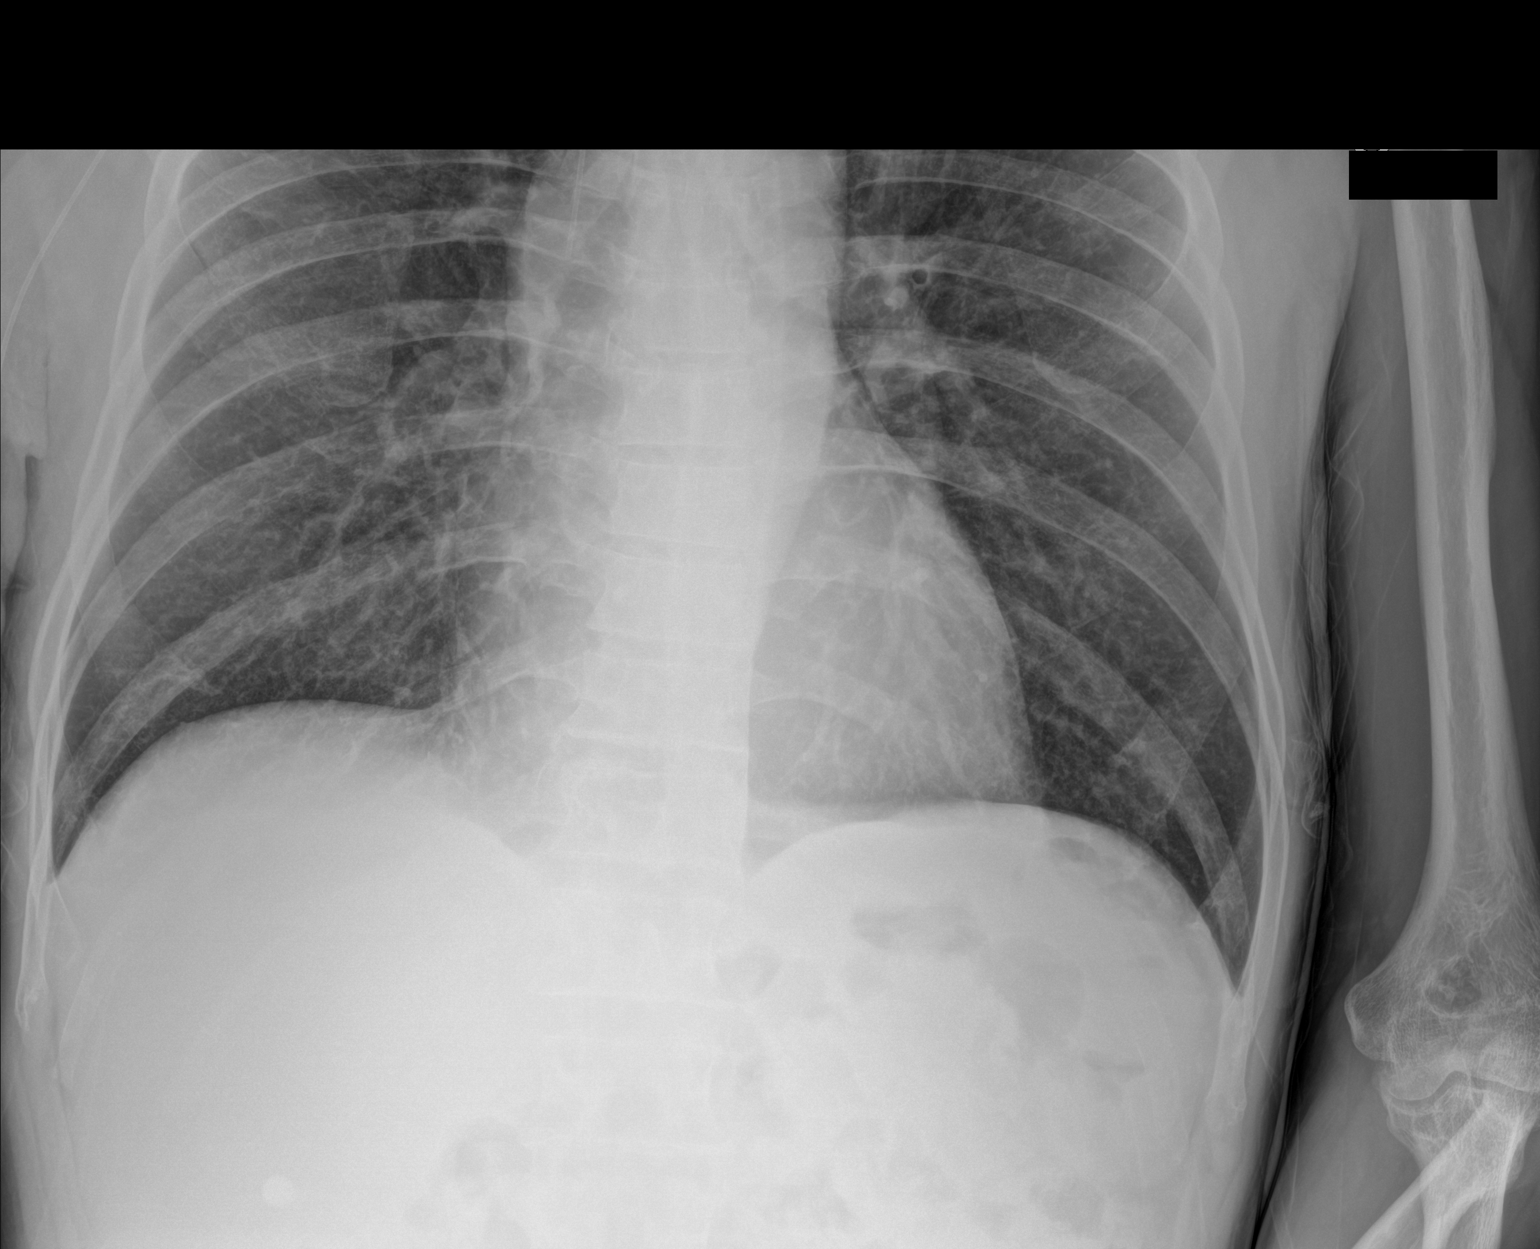

[2 of 2 positions shown; findings below may reference images not displayed]

FINDINGS: The right arm PICC line tip is in the mid SVC.  Lungs are clear.

Heart size and mediastinal contours are within normal limits.

No effusion.

Visualized bones unremarkable.

Right nephrolithiasis incidentally noted.
IMPRESSION: 1. Right arm PICC line to the mid SVC.
2. No acute cardiopulmonary disease.
3. Right nephrolithiasis.

## 2023-03-25 IMAGING — XA IR REPLACE PICC
1 series · 1 of 1 positions shown · non-contrast
Comparison: none

INDICATION: Patient with history of MRSA bacteremia, currently on antibiotics
via right upper extremity PICC line. There has been difficulty with
aspiration and infusion via the PICC line and request received now
for PICC exchange.

[Series 2: interv standard · 1 of 1 slices shown]
[im 1/1]
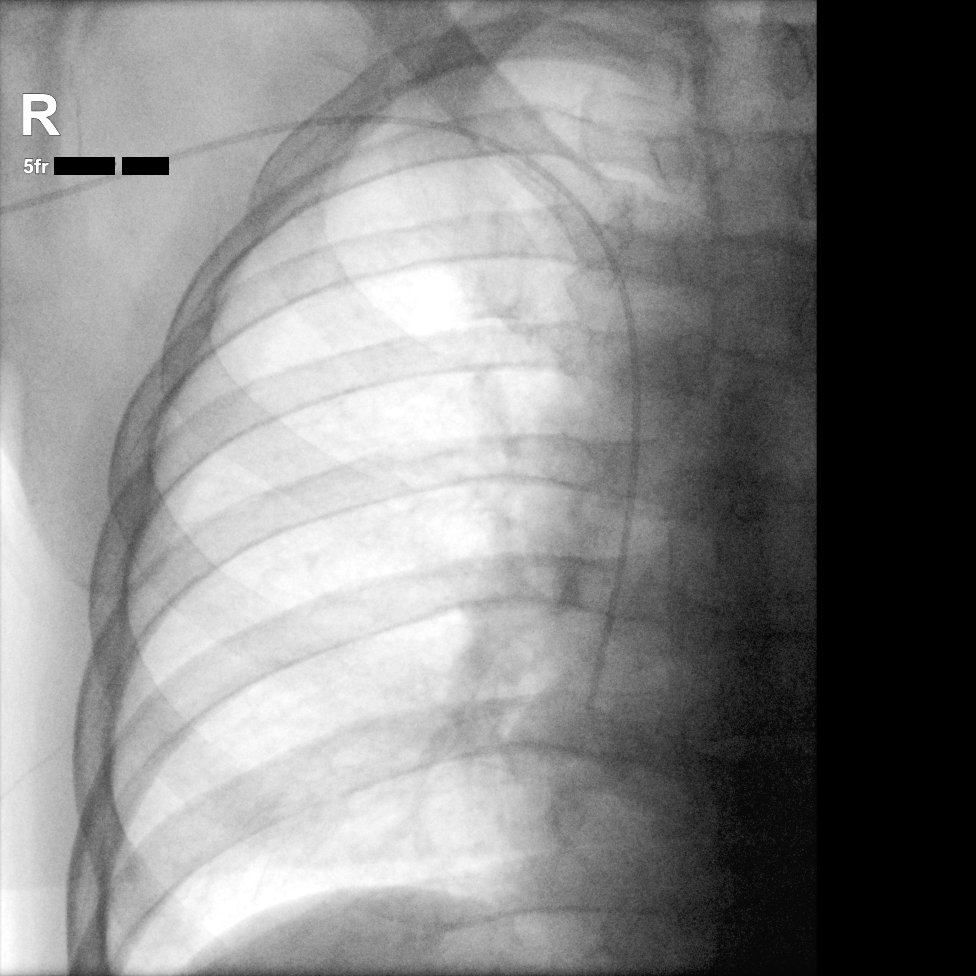

[1 of 1 positions shown; findings below may reference images not displayed]

EXAM:
RIGHT UPPER EXTREMITY PICC EXCHANGE

MEDICATIONS:
None

ANESTHESIA/SEDATION:
None

FLUOROSCOPY TIME:  Fluoroscopy Time:  48 seconds (2.4 mGy).

COMPLICATIONS:
None immediate.

PROCEDURE:
Informed written consent was obtained from the patient after a
thorough discussion of the procedural risks, benefits and
alternatives. All questions were addressed. Maximal Sterile Barrier
Technique was utilized including caps, mask, sterile gowns, sterile
gloves, sterile drape, hand hygiene and skin antiseptic. A timeout
was performed prior to the initiation of the procedure. The existing
PICC line was cut and a guidewire was placed into the PICC and
advanced to the SVC right atrial junction under fluoroscopic
guidance. The existing PICC was removed over the wire and a new 38
cm single-lumen 5 French PICC line was inserted with the tip at the
SVC right atrial junction. The guidewire was removed. Spot chest
radiograph confirmed appropriate placement. The catheter was flushed
without difficulty and secured to the skin site.
IMPRESSION: Successful right upper extremity PICC exchange as discussed above.

## 2023-07-10 IMAGING — CR DG CHEST 2V
2 series · 2 of 2 positions shown · non-contrast
Comparison: Chest radiographs 01/11/2021

CLINICAL DATA: Fever.

EXAM:
CHEST - 2 VIEW

[chest lat]
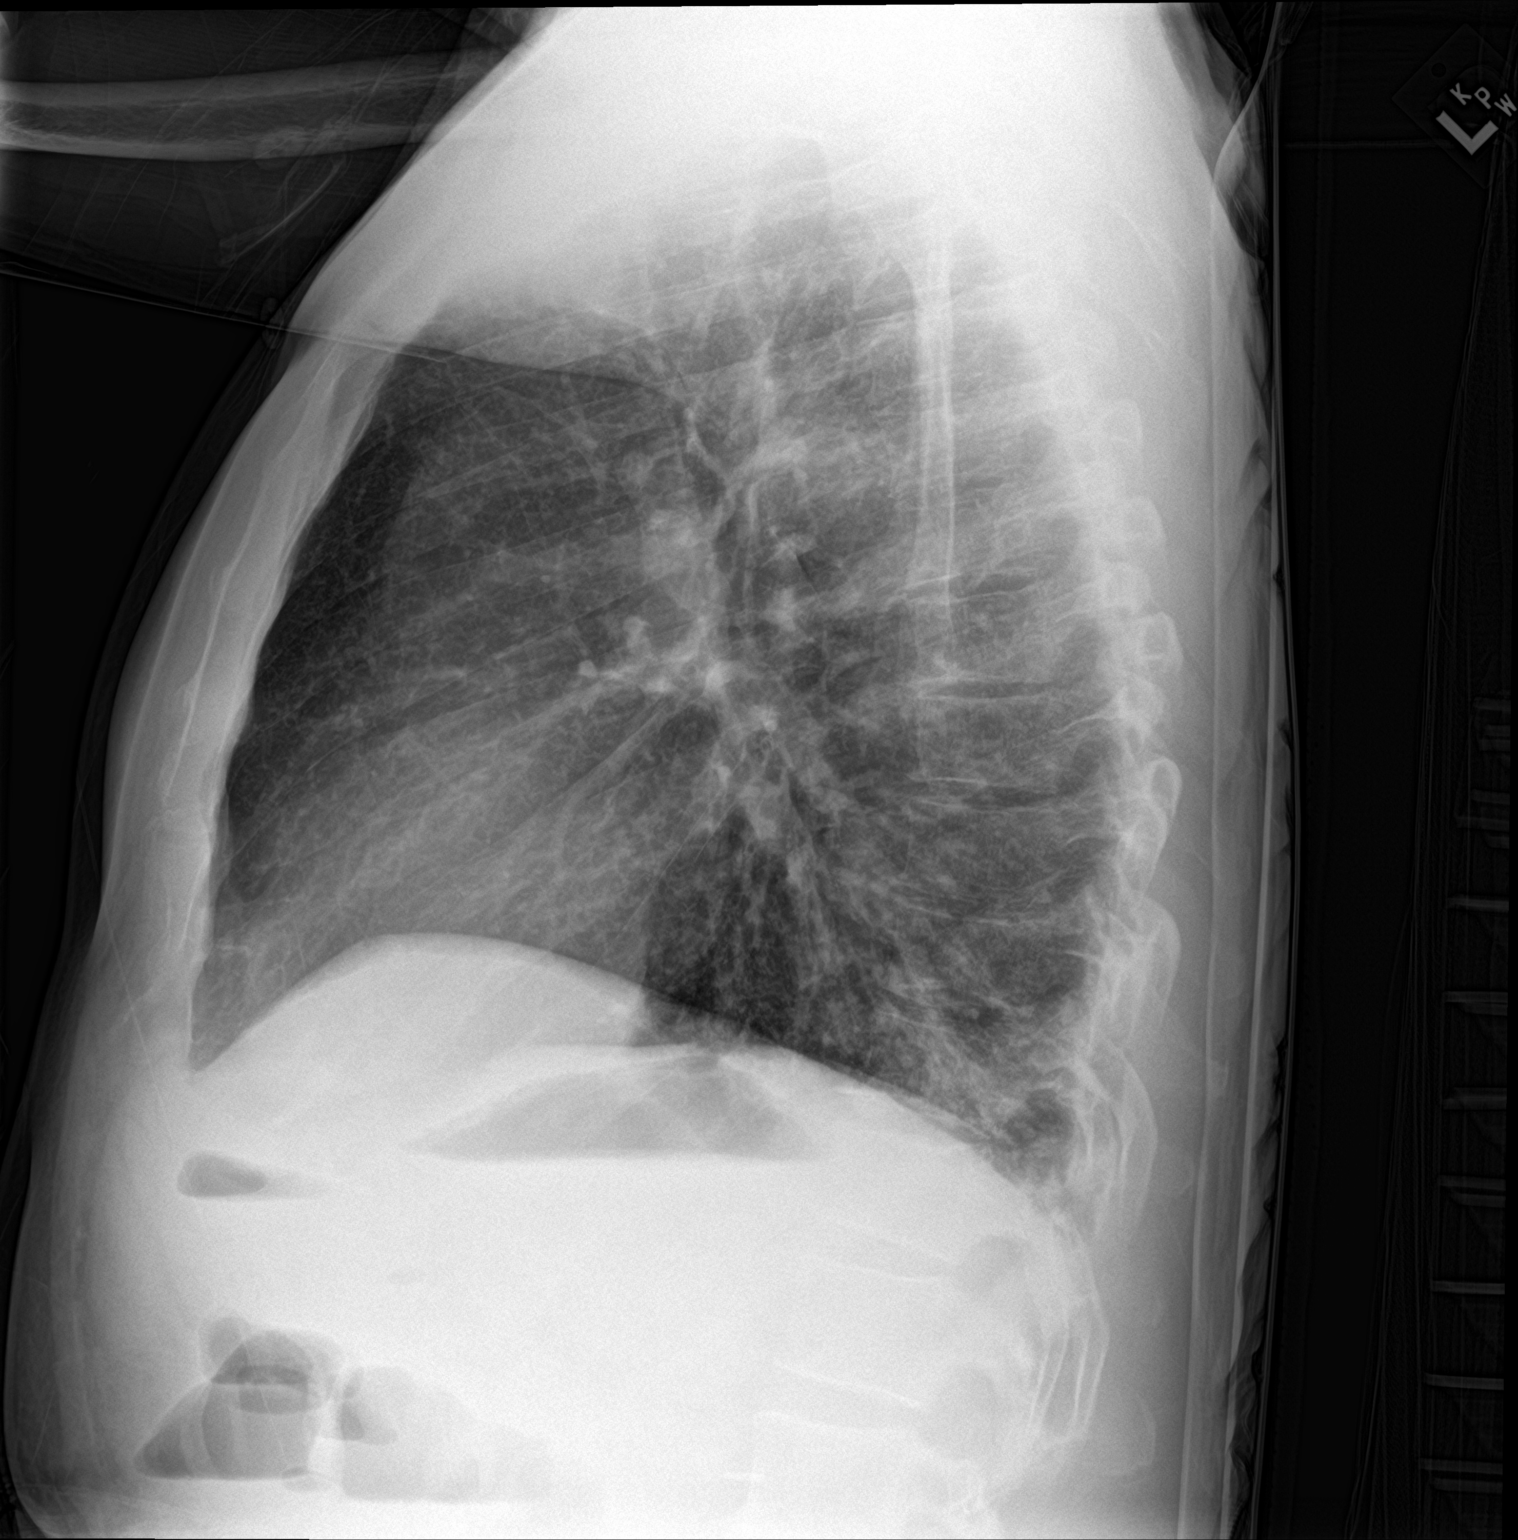

[chest ap]
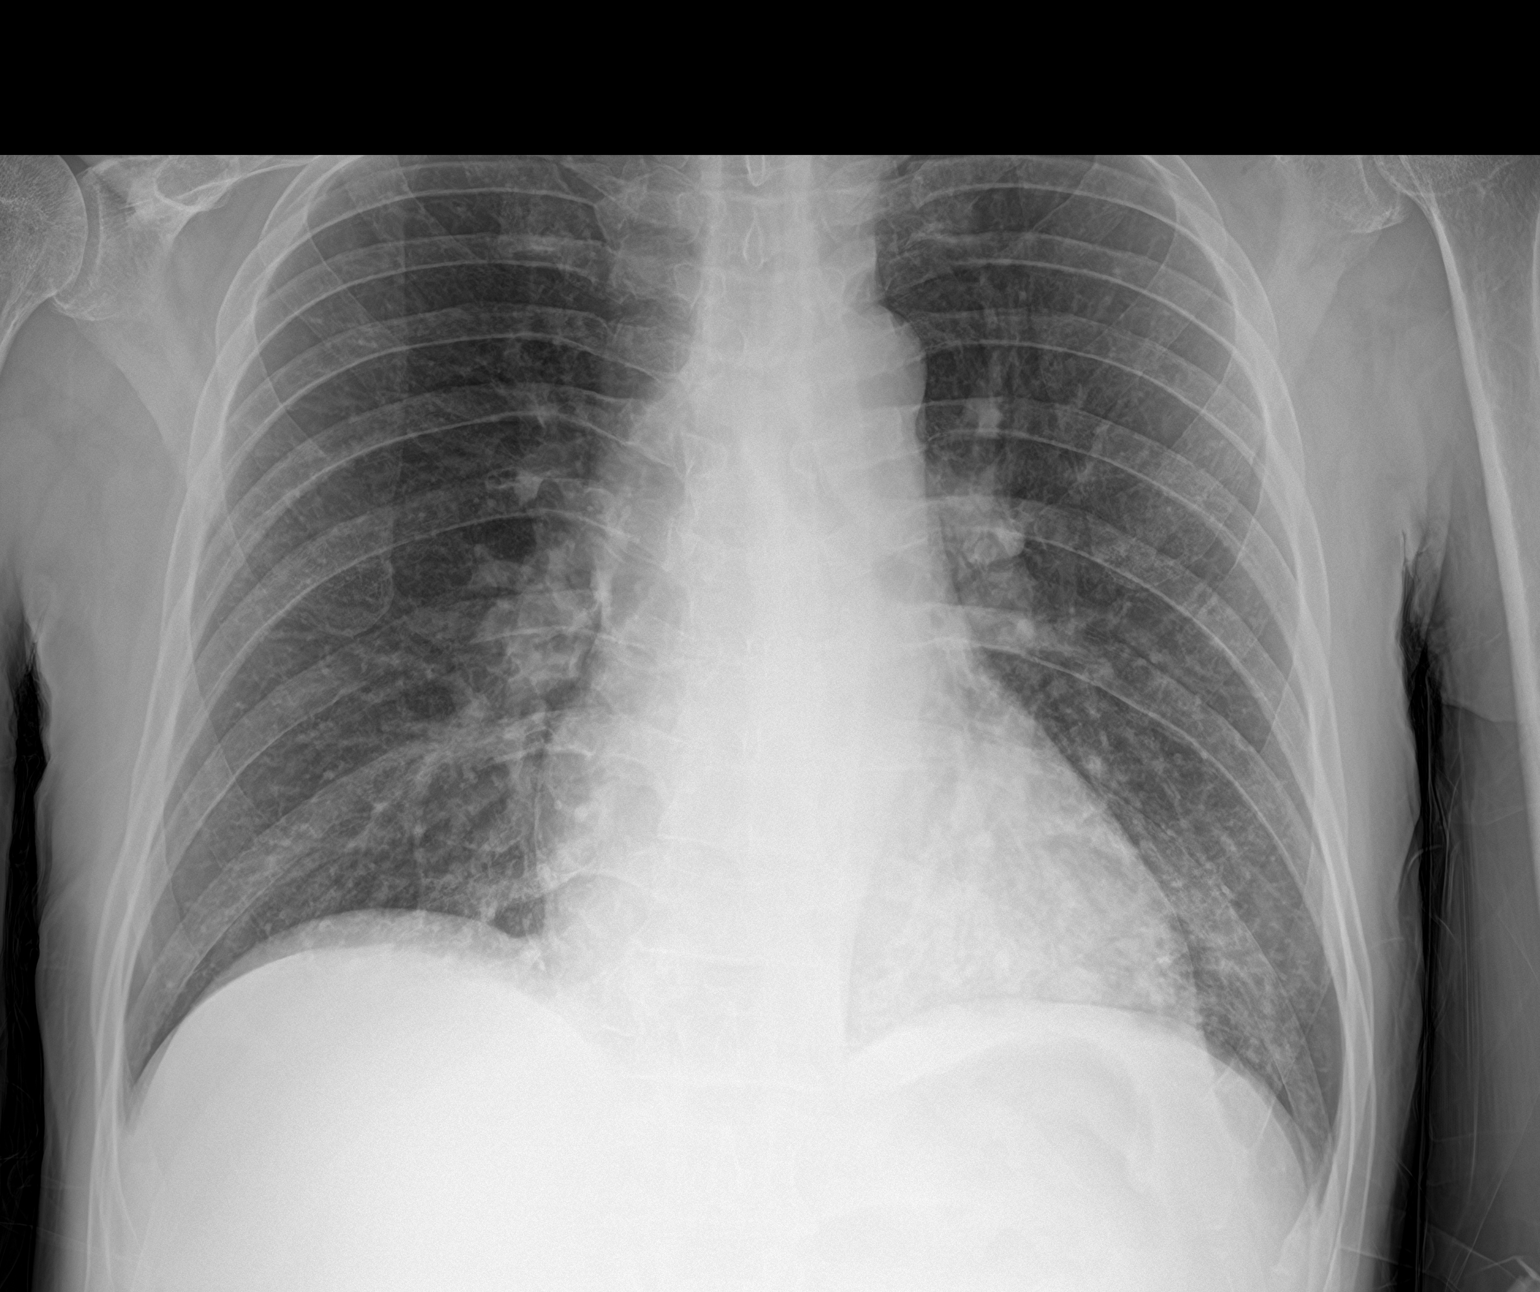

[2 of 2 positions shown; findings below may reference images not displayed]

FINDINGS: The cardiomediastinal silhouette is unchanged with normal heart
size. The PICC has been removed. There is new patchy opacity in the
basilar left lower lobe. No edema, pleural effusion, or pneumothorax
is identified. No acute osseous abnormality is seen.
IMPRESSION: Left lower lobe opacity compatible with pneumonia.
